# Patient Record
Sex: Male | Born: 1960
Health system: Southern US, Community
[De-identification: ages and names within clinical notes are randomized; demographics above are authoritative.]

## PROBLEM LIST (undated history)

## (undated) DIAGNOSIS — J45909 Unspecified asthma, uncomplicated: Secondary | ICD-10-CM

## (undated) DIAGNOSIS — Z720 Tobacco use: Secondary | ICD-10-CM

## (undated) DIAGNOSIS — J189 Pneumonia, unspecified organism: Secondary | ICD-10-CM

## (undated) DIAGNOSIS — G43909 Migraine, unspecified, not intractable, without status migrainosus: Secondary | ICD-10-CM

## (undated) DIAGNOSIS — F1020 Alcohol dependence, uncomplicated: Secondary | ICD-10-CM

## (undated) DIAGNOSIS — E663 Overweight: Secondary | ICD-10-CM

## (undated) DIAGNOSIS — R0600 Dyspnea, unspecified: Secondary | ICD-10-CM

## (undated) DIAGNOSIS — K219 Gastro-esophageal reflux disease without esophagitis: Secondary | ICD-10-CM

## (undated) DIAGNOSIS — R748 Abnormal levels of other serum enzymes: Secondary | ICD-10-CM

## (undated) DIAGNOSIS — N200 Calculus of kidney: Secondary | ICD-10-CM

## (undated) DIAGNOSIS — I10 Essential (primary) hypertension: Secondary | ICD-10-CM

## (undated) DIAGNOSIS — R519 Headache, unspecified: Secondary | ICD-10-CM

## (undated) DIAGNOSIS — Z860100 Personal history of colon polyps, unspecified: Secondary | ICD-10-CM

## (undated) DIAGNOSIS — R7303 Prediabetes: Secondary | ICD-10-CM

## (undated) DIAGNOSIS — G709 Myoneural disorder, unspecified: Secondary | ICD-10-CM

## (undated) DIAGNOSIS — M1990 Unspecified osteoarthritis, unspecified site: Secondary | ICD-10-CM

## (undated) DIAGNOSIS — R443 Hallucinations, unspecified: Secondary | ICD-10-CM

## (undated) DIAGNOSIS — D649 Anemia, unspecified: Secondary | ICD-10-CM

## (undated) DIAGNOSIS — R51 Headache: Secondary | ICD-10-CM

## (undated) HISTORY — DX: Overweight: E66.3

## (undated) HISTORY — DX: Abnormal levels of other serum enzymes: R74.8

## (undated) HISTORY — DX: Tobacco use: Z72.0

## (undated) HISTORY — DX: Personal history of colonic polyps: Z86.010

## (undated) HISTORY — DX: Calculus of kidney: N20.0

## (undated) HISTORY — DX: Essential (primary) hypertension: I10

## (undated) HISTORY — DX: Alcohol dependence, uncomplicated: F10.20

## (undated) HISTORY — DX: Unspecified asthma, uncomplicated: J45.909

## (undated) HISTORY — DX: Migraine, unspecified, not intractable, without status migrainosus: G43.909

## (undated) HISTORY — DX: Personal history of colon polyps, unspecified: Z86.0100

## (undated) HISTORY — DX: Hallucinations, unspecified: R44.3

## (undated) HISTORY — DX: Prediabetes: R73.03

---

## 2000-02-01 HISTORY — PX: OTHER SURGICAL HISTORY: SHX169

## 2000-06-21 ENCOUNTER — Ambulatory Visit (HOSPITAL_COMMUNITY): Admission: RE | Admit: 2000-06-21 | Discharge: 2000-06-21 | Payer: Self-pay | Admitting: General Surgery

## 2000-07-01 ENCOUNTER — Inpatient Hospital Stay (HOSPITAL_COMMUNITY): Admission: RE | Admit: 2000-07-01 | Discharge: 2000-07-02 | Payer: Self-pay | Admitting: General Surgery

## 2002-01-31 HISTORY — PX: COLONOSCOPY: SHX174

## 2002-12-03 ENCOUNTER — Ambulatory Visit (HOSPITAL_COMMUNITY): Admission: RE | Admit: 2002-12-03 | Discharge: 2002-12-03 | Payer: Self-pay | Admitting: Urology

## 2002-12-03 ENCOUNTER — Ambulatory Visit (HOSPITAL_BASED_OUTPATIENT_CLINIC_OR_DEPARTMENT_OTHER): Admission: RE | Admit: 2002-12-03 | Discharge: 2002-12-03 | Payer: Self-pay | Admitting: Urology

## 2002-12-03 ENCOUNTER — Encounter (INDEPENDENT_AMBULATORY_CARE_PROVIDER_SITE_OTHER): Payer: Self-pay

## 2002-12-07 ENCOUNTER — Emergency Department (HOSPITAL_COMMUNITY): Admission: EM | Admit: 2002-12-07 | Discharge: 2002-12-07 | Payer: Self-pay | Admitting: Emergency Medicine

## 2005-01-31 HISTORY — PX: COLONOSCOPY: SHX174

## 2005-04-12 ENCOUNTER — Ambulatory Visit: Payer: Self-pay | Admitting: Family Medicine

## 2005-05-10 ENCOUNTER — Ambulatory Visit (HOSPITAL_COMMUNITY): Admission: RE | Admit: 2005-05-10 | Discharge: 2005-05-10 | Payer: Self-pay | Admitting: Family Medicine

## 2005-05-10 ENCOUNTER — Ambulatory Visit: Payer: Self-pay | Admitting: Family Medicine

## 2005-09-12 ENCOUNTER — Ambulatory Visit: Payer: Self-pay | Admitting: Family Medicine

## 2005-09-13 ENCOUNTER — Ambulatory Visit: Payer: Self-pay | Admitting: Gastroenterology

## 2005-09-13 ENCOUNTER — Ambulatory Visit (HOSPITAL_COMMUNITY): Admission: RE | Admit: 2005-09-13 | Discharge: 2005-09-13 | Payer: Self-pay | Admitting: Family Medicine

## 2005-09-15 ENCOUNTER — Ambulatory Visit (HOSPITAL_COMMUNITY): Admission: RE | Admit: 2005-09-15 | Discharge: 2005-09-15 | Payer: Self-pay | Admitting: Gastroenterology

## 2005-09-16 ENCOUNTER — Ambulatory Visit (HOSPITAL_COMMUNITY): Admission: RE | Admit: 2005-09-16 | Discharge: 2005-09-16 | Payer: Self-pay | Admitting: Gastroenterology

## 2005-09-16 ENCOUNTER — Ambulatory Visit: Payer: Self-pay | Admitting: Gastroenterology

## 2005-10-11 ENCOUNTER — Ambulatory Visit: Payer: Self-pay | Admitting: Gastroenterology

## 2006-03-10 ENCOUNTER — Encounter: Payer: Self-pay | Admitting: Family Medicine

## 2006-03-10 ENCOUNTER — Ambulatory Visit: Payer: Self-pay | Admitting: Gastroenterology

## 2006-09-18 ENCOUNTER — Emergency Department (HOSPITAL_COMMUNITY): Admission: EM | Admit: 2006-09-18 | Discharge: 2006-09-18 | Payer: Self-pay | Admitting: Emergency Medicine

## 2007-01-19 ENCOUNTER — Emergency Department (HOSPITAL_COMMUNITY): Admission: EM | Admit: 2007-01-19 | Discharge: 2007-01-19 | Payer: Self-pay | Admitting: Emergency Medicine

## 2007-03-13 ENCOUNTER — Ambulatory Visit: Payer: Self-pay | Admitting: Gastroenterology

## 2007-04-19 ENCOUNTER — Ambulatory Visit: Payer: Self-pay | Admitting: Family Medicine

## 2007-04-19 LAB — CONVERTED CEMR LAB
BUN: 10 mg/dL (ref 6–23)
Basophils Absolute: 0.1 10*3/uL (ref 0.0–0.1)
Basophils Relative: 1 % (ref 0–1)
CO2: 22 meq/L (ref 19–32)
Calcium: 8.9 mg/dL (ref 8.4–10.5)
Chloride: 106 meq/L (ref 96–112)
Cholesterol: 159 mg/dL (ref 0–200)
Creatinine, Ser: 0.85 mg/dL (ref 0.40–1.50)
Eosinophils Absolute: 0.4 10*3/uL (ref 0.0–0.7)
Eosinophils Relative: 6 % — ABNORMAL HIGH (ref 0–5)
Glucose, Bld: 95 mg/dL (ref 70–99)
HCT: 47 % (ref 39.0–52.0)
HDL: 38 mg/dL — ABNORMAL LOW (ref >39)
Hemoglobin: 15.7 g/dL (ref 13.0–17.0)
LDL Cholesterol: 93 mg/dL (ref 0–99)
Lymphocytes Relative: 42 % (ref 12–46)
Lymphs Abs: 2.7 10*3/uL (ref 0.7–4.0)
MCHC: 33.4 g/dL (ref 30.0–36.0)
MCV: 92.9 fL (ref 78.0–100.0)
Monocytes Absolute: 0.6 10*3/uL (ref 0.1–1.0)
Monocytes Relative: 9 % (ref 3–12)
Neutro Abs: 2.7 10*3/uL (ref 1.7–7.7)
Neutrophils Relative %: 41 % — ABNORMAL LOW (ref 43–77)
PSA: 0.85 ng/mL (ref 0.10–4.00)
Platelets: 213 10*3/uL (ref 150–400)
Potassium: 4.4 meq/L (ref 3.5–5.3)
RBC: 5.06 M/uL (ref 4.22–5.81)
RDW: 13.9 % (ref 11.5–15.5)
Sodium: 142 meq/L (ref 135–145)
Triglycerides: 141 mg/dL (ref <150)
VLDL: 28 mg/dL (ref 0–40)
WBC: 6.5 10*3/uL (ref 4.0–10.5)

## 2007-08-20 DIAGNOSIS — F1010 Alcohol abuse, uncomplicated: Secondary | ICD-10-CM | POA: Insufficient documentation

## 2008-02-04 ENCOUNTER — Encounter: Payer: Self-pay | Admitting: Orthopedic Surgery

## 2008-02-04 ENCOUNTER — Emergency Department (HOSPITAL_COMMUNITY): Admission: EM | Admit: 2008-02-04 | Discharge: 2008-02-04 | Payer: Self-pay | Admitting: Emergency Medicine

## 2008-02-14 ENCOUNTER — Ambulatory Visit: Payer: Self-pay | Admitting: Family Medicine

## 2008-02-24 DIAGNOSIS — E049 Nontoxic goiter, unspecified: Secondary | ICD-10-CM | POA: Insufficient documentation

## 2008-04-24 ENCOUNTER — Ambulatory Visit: Payer: Self-pay | Admitting: Family Medicine

## 2008-04-24 DIAGNOSIS — R9431 Abnormal electrocardiogram [ECG] [EKG]: Secondary | ICD-10-CM | POA: Insufficient documentation

## 2008-04-24 DIAGNOSIS — R079 Chest pain, unspecified: Secondary | ICD-10-CM | POA: Insufficient documentation

## 2008-04-25 ENCOUNTER — Encounter: Payer: Self-pay | Admitting: Family Medicine

## 2008-04-26 ENCOUNTER — Encounter: Payer: Self-pay | Admitting: Family Medicine

## 2008-04-29 ENCOUNTER — Encounter: Payer: Self-pay | Admitting: Family Medicine

## 2008-04-30 LAB — CONVERTED CEMR LAB
BUN: 9 mg/dL (ref 6–23)
Basophils Absolute: 0.1 10*3/uL (ref 0.0–0.1)
Basophils Relative: 1 % (ref 0–1)
CO2: 19 meq/L (ref 19–32)
Calcium: 9.7 mg/dL (ref 8.4–10.5)
Chloride: 105 meq/L (ref 96–112)
Cholesterol: 110 mg/dL (ref 0–200)
Creatinine, Ser: 0.91 mg/dL (ref 0.40–1.50)
Eosinophils Absolute: 0.3 10*3/uL (ref 0.0–0.7)
Eosinophils Relative: 4 % (ref 0–5)
Glucose, Bld: 87 mg/dL (ref 70–99)
HCT: 47.8 % (ref 39.0–52.0)
HDL: 35 mg/dL — ABNORMAL LOW (ref >39)
Hemoglobin: 15.7 g/dL (ref 13.0–17.0)
LDL Cholesterol: 39 mg/dL (ref 0–99)
Lymphocytes Relative: 44 % (ref 12–46)
Lymphs Abs: 3.2 10*3/uL (ref 0.7–4.0)
MCHC: 32.8 g/dL (ref 30.0–36.0)
MCV: 92.8 fL (ref 78.0–100.0)
Monocytes Absolute: 0.7 10*3/uL (ref 0.1–1.0)
Monocytes Relative: 10 % (ref 3–12)
Neutro Abs: 2.9 10*3/uL (ref 1.7–7.7)
Neutrophils Relative %: 41 % — ABNORMAL LOW (ref 43–77)
PSA: 0.87 ng/mL (ref 0.10–4.00)
Platelets: 258 10*3/uL (ref 150–400)
Potassium: 4.5 meq/L (ref 3.5–5.3)
RBC: 5.15 M/uL (ref 4.22–5.81)
RDW: 13.5 % (ref 11.5–15.5)
Sodium: 136 meq/L (ref 135–145)
Total CHOL/HDL Ratio: 3.1
Triglycerides: 178 mg/dL — ABNORMAL HIGH (ref <150)
VLDL: 36 mg/dL (ref 0–40)
WBC: 7.2 10*3/uL (ref 4.0–10.5)

## 2008-05-23 ENCOUNTER — Telehealth: Payer: Self-pay | Admitting: Family Medicine

## 2008-07-30 ENCOUNTER — Encounter: Payer: Self-pay | Admitting: Family Medicine

## 2009-03-04 ENCOUNTER — Ambulatory Visit: Payer: Self-pay | Admitting: Orthopedic Surgery

## 2009-03-04 ENCOUNTER — Encounter (INDEPENDENT_AMBULATORY_CARE_PROVIDER_SITE_OTHER): Payer: Self-pay | Admitting: *Deleted

## 2009-03-04 ENCOUNTER — Ambulatory Visit: Payer: Self-pay | Admitting: Family Medicine

## 2009-03-04 DIAGNOSIS — M23302 Other meniscus derangements, unspecified lateral meniscus, unspecified knee: Secondary | ICD-10-CM | POA: Insufficient documentation

## 2009-03-04 DIAGNOSIS — M25569 Pain in unspecified knee: Secondary | ICD-10-CM | POA: Insufficient documentation

## 2009-03-04 LAB — CONVERTED CEMR LAB
BUN: 11 mg/dL
Chloride: 103 meq/L
Eosinophils Absolute: 0.2 10*3/uL
Eosinophils Relative: 3 %
Glucose, Bld: 78 mg/dL
Hemoglobin: 14.7 g/dL
Lymphs Abs: 2.2 10*3/uL
MCV: 93.6 fL
Monocytes Relative: 10 %
Platelets: 168 10*3/uL
Potassium: 3.8 meq/L
TSH: 1.721 microintl units/mL
Triglycerides: 169 mg/dL
WBC: 6.9 10*3/uL

## 2009-03-05 LAB — CONVERTED CEMR LAB
Chloride: 103 meq/L (ref 96–112)
Creatinine, Ser: 0.75 mg/dL (ref 0.40–1.50)
Eosinophils Absolute: 0.2 10*3/uL (ref 0.0–0.7)
HDL: 42 mg/dL (ref >39)
LDL Cholesterol: 106 mg/dL — ABNORMAL HIGH (ref 0–99)
Lymphocytes Relative: 31 % (ref 12–46)
Lymphs Abs: 2.2 10*3/uL (ref 0.7–4.0)
Monocytes Relative: 10 % (ref 3–12)
Neutro Abs: 3.8 10*3/uL (ref 1.7–7.7)
Neutrophils Relative %: 56 % (ref 43–77)
Platelets: 168 10*3/uL (ref 150–400)
RBC: 4.71 M/uL (ref 4.22–5.81)
Sodium: 139 meq/L (ref 135–145)
Total CHOL/HDL Ratio: 4.3
Triglycerides: 169 mg/dL — ABNORMAL HIGH (ref <150)
WBC: 6.9 10*3/uL (ref 4.0–10.5)

## 2009-04-20 ENCOUNTER — Telehealth: Payer: Self-pay | Admitting: Orthopedic Surgery

## 2009-08-19 ENCOUNTER — Ambulatory Visit: Payer: Self-pay | Admitting: Family Medicine

## 2009-08-19 ENCOUNTER — Ambulatory Visit (HOSPITAL_COMMUNITY): Admission: RE | Admit: 2009-08-19 | Discharge: 2009-08-19 | Payer: Self-pay | Admitting: Family Medicine

## 2009-08-19 ENCOUNTER — Encounter (INDEPENDENT_AMBULATORY_CARE_PROVIDER_SITE_OTHER): Payer: Self-pay

## 2009-09-01 ENCOUNTER — Encounter (INDEPENDENT_AMBULATORY_CARE_PROVIDER_SITE_OTHER): Payer: Self-pay | Admitting: *Deleted

## 2009-09-07 ENCOUNTER — Ambulatory Visit: Payer: Self-pay | Admitting: Cardiology

## 2009-09-07 ENCOUNTER — Encounter (INDEPENDENT_AMBULATORY_CARE_PROVIDER_SITE_OTHER): Payer: Self-pay | Admitting: *Deleted

## 2009-09-07 DIAGNOSIS — D126 Benign neoplasm of colon, unspecified: Secondary | ICD-10-CM | POA: Insufficient documentation

## 2009-09-07 DIAGNOSIS — K7689 Other specified diseases of liver: Secondary | ICD-10-CM | POA: Insufficient documentation

## 2009-09-08 ENCOUNTER — Encounter: Payer: Self-pay | Admitting: Cardiology

## 2009-09-15 ENCOUNTER — Encounter (INDEPENDENT_AMBULATORY_CARE_PROVIDER_SITE_OTHER): Payer: Self-pay | Admitting: *Deleted

## 2009-09-17 ENCOUNTER — Encounter: Payer: Self-pay | Admitting: Cardiology

## 2009-09-17 ENCOUNTER — Ambulatory Visit (HOSPITAL_COMMUNITY): Admission: RE | Admit: 2009-09-17 | Discharge: 2009-09-17 | Payer: Self-pay | Admitting: Cardiology

## 2009-09-17 ENCOUNTER — Ambulatory Visit: Payer: Self-pay | Admitting: Cardiology

## 2009-09-22 ENCOUNTER — Ambulatory Visit: Payer: Self-pay | Admitting: Cardiology

## 2009-10-07 ENCOUNTER — Ambulatory Visit: Payer: Self-pay | Admitting: Family Medicine

## 2009-10-12 ENCOUNTER — Encounter: Payer: Self-pay | Admitting: Cardiology

## 2009-11-24 ENCOUNTER — Encounter: Payer: Self-pay | Admitting: Family Medicine

## 2010-01-08 ENCOUNTER — Encounter (INDEPENDENT_AMBULATORY_CARE_PROVIDER_SITE_OTHER): Payer: Self-pay | Admitting: *Deleted

## 2010-01-31 HISTORY — PX: CATARACT EXTRACTION W/ INTRAOCULAR LENS IMPLANT: SHX1309

## 2010-02-03 LAB — HM COLONOSCOPY

## 2010-02-09 ENCOUNTER — Ambulatory Visit
Admission: RE | Admit: 2010-02-09 | Discharge: 2010-02-09 | Payer: Self-pay | Source: Home / Self Care | Attending: Family Medicine | Admitting: Family Medicine

## 2010-02-09 DIAGNOSIS — K648 Other hemorrhoids: Secondary | ICD-10-CM | POA: Insufficient documentation

## 2010-02-21 ENCOUNTER — Encounter: Payer: Self-pay | Admitting: Family Medicine

## 2010-02-25 ENCOUNTER — Telehealth (INDEPENDENT_AMBULATORY_CARE_PROVIDER_SITE_OTHER): Payer: Self-pay | Admitting: *Deleted

## 2010-03-01 ENCOUNTER — Encounter: Payer: Self-pay | Admitting: Family Medicine

## 2010-03-02 NOTE — Letter (Signed)
Summary: Out of Work  [facility]  [address]  [place], [place] [address]   Phone: [phone]  Fax: [phone]    March 04, 2009   Employee:  Bryan Rodgers    To Whom It May Concern:   For Medical reasons, please excuse the above named employee from work for the following dates:  Start:   Wed, March 04, 2009  End:   Fri,  March 06, 2009  Medically cleared to return to work on next scheduled day:  Tues, March 10, 2009   If you need additional information, please feel free to contact our office.         Sincerely,    Terrance Mass, MD

## 2010-03-02 NOTE — Assessment & Plan Note (Signed)
Summary: office visit   Vital Signs:  Patient profile:   50 year old male Height:      70 inches Weight:      196 pounds BMI:     28.22 O2 Sat:      98 % Pulse rate:   72 / minute Pulse rhythm:   regular Resp:     16 per minute BP sitting:   120 / 90  (left arm)  Vitals Entered By: Everitt Amber LPN (October 07, 2009 11:25 AM)  Nutrition Counseling: Patient's BMI is greater than 25 and therefore counseled on weight management options. CC: Follow up chronic problems   Primary Care Provider:  Syliva Overman MD  CC:  Follow up chronic problems.  History of Present Illness: Reports  that he has been doing well. Denies recent fever or chills. Denies sinus pressure, nasal congestion , ear pain or sore throat. Denies chest congestion, or cough productive of sputum. Denies chest pain, palpitations, PND, orthopnea or leg swelling.He has recently been evaluated by the local cardiologists and is reassured that he has no cAD Denies abdominal pain, nausea, vomitting, diarrhea or constipation. Denies change in bowel movements or bloody stool. Denies dysuria , frequency, incontinence or hesitancy. Denies  joint pain, swelling, or reduced mobility. Denies headaches, vertigo, seizures. Denies depression, anxiety or insomnia. Denies  rash, lesions, or itch.       Preventive Screening-Counseling & Management  Alcohol-Tobacco     Smoking Cessation Counseling: yes  Current Medications (verified): 1)  Ibuprofen 800 Mg Tabs (Ibuprofen) .... Take 1 Tablet By Mouth Three Times A Day 2)  Chlorthalidone 25 Mg Tabs (Chlorthalidone) .... Take 1/2   Tablet By Mouth Daily 3)  Metoprolol Succinate 25 Mg Xr24h-Tab (Metoprolol Succinate) .... Take 1/2  Tablet By Mouth Daily  Allergies (verified): No Known Drug Allergies  Review of Systems      See HPI Eyes:  Denies blurring, discharge, eye pain, and red eye. Endo:  Denies excessive thirst and excessive urination. Heme:  Denies abnormal  bruising and bleeding. Allergy:  Denies hives or rash and itching eyes.  Physical Exam  General:  Well-developed,well-nourished,in no acute distress; alert,appropriate and cooperative throughout examination HEENT: No facial asymmetry,  EOMI, No sinus tenderness, TM's Clear, oropharynx  pink and moist.   Chest: Clear to auscultation bilaterally.  CVS: S1, S2, No murmurs, No S3.   Abd: Soft, Nontender.  MS: Adequate ROM spine, hips, shoulders and knees.  Ext: No edema.   CNS: CN 2-12 intact, power tone and sensation normal throughout.   Skin: Intact, no visible lesions or rashes.  Psych: Good eye contact, normal affect.  Memory intact, not anxious or depressed appearing.    Impression & Recommendations:  Problem # 1:  HYPERTENSION-MILD (ICD-401.1) Assessment Unchanged  His updated medication list for this problem includes:    Chlorthalidone 25 Mg Tabs (Chlorthalidone) .Marland Kitchen... Take 1/2   tablet by mouth daily    Metoprolol Succinate 25 Mg Xr24h-tab (Metoprolol succinate) .Marland Kitchen... Take 1/2  tablet by mouth daily Check your Blood Pressure regularly. If it is above150/95  you should make an appointment.   BP today: 120/90 Prior BP: 139/81 (09/22/2009)  Labs Reviewed: K+: 3.8 (03/04/2009) Creat: : 0.75 (03/04/2009)   Chol: 182 (03/04/2009)   HDL: 42 (03/04/2009)   LDL: 106 (03/04/2009)   TG: 169 (03/04/2009)  Problem # 2:  TOBACCO ABUSE (ICD-305.1) Assessment: Improved  Encouraged smoking cessation and discussed different methods for smoking cessation.   Complete Medication  List: 1)  Ibuprofen 800 Mg Tabs (Ibuprofen) .... Take 1 tablet by mouth three times a day 2)  Chlorthalidone 25 Mg Tabs (Chlorthalidone) .... Take 1/2   tablet by mouth daily 3)  Metoprolol Succinate 25 Mg Xr24h-tab (Metoprolol succinate) .... Take 1/2  tablet by mouth daily  Patient Instructions: 1)  Please schedule a fcPE in January 2)  pLs take one half of each tablet once daily for your blood pressure 3)   Tobacco is very bad for your health and your loved ones! You Should stop smoking!. 4)  Stop Smoking Tips: Choose a Quit date. Cut down before the Quit date. decide what you will do as a substitute when you feel the urge to smoke(gum,toothpick,exercise). 5)  It is important that you exercise regularly at least 20 minutes 5 times a week. If you develop chest pain, have severe difficulty breathing, or feel very tired , stop exercising immediately and seek medical attention. 6)  You need to lose weight. Consider a lower calorie diet and regular exercise.

## 2010-03-02 NOTE — Letter (Signed)
Summary: [facility] 08/[facility]/09  [facility] 08[facility]   Imported By: Faythe Ghee 10/12/2009 12:33:16  _____________________________________________________________________  External Attachment:    Type:   Image     Comment:   External Document

## 2010-03-02 NOTE — Assessment & Plan Note (Signed)
Summary: RT LEG PAIN,SWELLING/DR SIMPSON REQ APPT TODAY/NEEDS XRAYS/SE...   Vital Signs:  Patient profile:   50 year old male Weight:      206 pounds Pulse rate:   88 / minute Resp:     18 per minute  Visit Type:  Initial Consult Referring Provider:  Dr.Simpson Primary Provider:  Syliva Overman MD  CC:  left knee pain.  History of Present Illness: I saw Bryan Rodgers in the office today for an initial visit.  He is a 50 years old man with the complaint of:  Left knee pain.  DOI 03/03/09.  Immediate consult requested by Dr. Lodema Hong regarding this patient's LEFT knee.  The patient went to work at his normal activities he was getting up out of a chair of acute pain in the LEFT knee where his knee became locked would not bend or straighten.  The pain then increased to be very severe.  There is no swelling.  The pain was essentially on the medial side bending him from normal weightbearing.  He denies neuralgias.   Will have xrays today.  he does have a history of some back pain and the L-spine x-ray on every fourth 2010.  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 Cardiac :  Denies chest pain, angina, heart attack, heart failure, poor circulation, blood clots, and phlebitis. Resp:  Denies short of breath, difficulty breathing, COPD, cough, and pneumonia. GI:  Denies nausea, vomiting, diarrhea, constipation, difficulty swallowing, ulcers, GERD, and reflux. GU:  Denies kidney failure, kidney transplant, kidney stones, burning, poor stream, testicular cancer, blood in urine, and . Neuro:  Denies headache, dizziness,  migraines, numbness, weakness, tremor, and unsteady walking. MS:  Complains of joint pain and joint swelling; denies rheumatoid arthritis, gout, bone cancer, osteoporosis, and . Endo:  Denies thyroid disease, goiter, and diabetes. Psych:  Denies depression, mood swings, anxiety, panic attack, bipolar, and schizophrenia. Derm:  Denies eczema, cancer, and itching. EENT:  Denies poor vision, cataracts, glaucoma, poor hearing, vertigo, ears ringing, sinusitis, hoarseness, toothaches, and bleeding gums. Immunology:  Denies seasonal allergies, sinus problems, and allergic to bee stings. Lymphatic:  Denies lymph node cancer and lymph edema.  Physical Exam  Extremities:   The upper extremities have normal appearance, ROM, strength and stability.  Additional Exam:  vital signs are stable as recorded  His appearance is normal muscular build normal nutrition no deformities good grooming  Peripheral vascular system no swelling normal pulses and temperature no edema  Lymph nodes lower extremities negative  Gait and station are abnormal he cannot walk without a significant limp  He has medial joint line tenderness there is no joint effusion his knee will not bend or straighten and is stuck at about 10 flexion his knee is stable.  I could not do a McMurray sign but he has essentially a locked knee  His skin is intact  His normal coordination reflexes sensation and is oriented and his mood and affect are normal   Impression & Recommendations:  Problem # 1:  DERANGEMENT MENISCUS (ICD-717.5)  we will we will try to get the MRI of the knee.  He  should use ice and ibuprofen and Vicodin for pain.  We'll get him a knee brace as well.  He's out of work.  Orders: New Patient Level IV ([address])  Medications Added to Medication List This Visit: 1)  Norco 5-325 Mg Tabs (Hydrocodone-acetaminophen) .Marland Kitchen.. 1 by mouth q 4 as needed pain 2)  Ibuprofen 800 Mg Tabs (Ibuprofen) .Marland Kitchen.. 1 by mouth three times a  day  Patient Instructions: 1)  MRI Left knee  2)  Ice three times a day  3)  take pain medication  4)  OOW  5)  Brace for ambulation (knee immobilizer) Prescriptions: IBUPROFEN 800 MG TABS (IBUPROFEN) 1 by mouth three times a day  #90 x 1   Entered and Authorized by:   Fuller Canada MD   Signed by:   Fuller Canada MD on 03/04/2009   Method used:   Print then Give to Patient   RxID:   [id number] NORCO 5-325 MG TABS (HYDROCODONE-ACETAMINOPHEN) 1 by mouth q 4 as needed pain  #42 x 1   Entered and Authorized by:   Fuller Canada MD   Signed by:   Fuller Canada MD on 03/04/2009   Method used:   Print then Give to Patient   RxID:   [id number]

## 2010-03-02 NOTE — Letter (Signed)
Summary: Letter  Letter   Imported By: Lind Guest 11/25/2009 14:40:54  _____________________________________________________________________  External Attachment:    Type:   Image     Comment:   External Document

## 2010-03-02 NOTE — Assessment & Plan Note (Signed)
Summary: Recent hospital admit for chest pain   Visit Type:  Initial Consult Referring Bryan Rodgers:  . Primary Bryan Rodgers:  Syliva Overman MD   History of Present Illness: [name] is seen in the office for an initial visit at the kind request of Dr. Lodema Hong for the evaluation of chest discomfort.  This nice gentleman was recently working in the [employer] area when he developed the sudden onset of moderately severe localized left upper chest pressure.  Onset was at rest, and there was no radiation.  Discomfort persisted for only approximately 20 seconds.  There was no associated dyspnea, diaphoresis or nausea.  Patient was in or near the local EMS facility, was instantaneously evaluated and transported to [facility] where myocardial infarction was ruled out.  He was told he had a mild myopathy, and fenofibrate was discontinued.  His chest discomfort was not thought to be cardiac in origin, and no cardiac testing was advised.  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  Mr. and [name] clearly describe care in a [facility] hospital, probably [place], for chest discomfort.  Evaluation included a stress test; however, there is no record of such a visit in patient's inpatient chart.  Hypertension was diagnosed during his hospitalization and metoprolol started.  Since then, his wife has noted that he has increasing fatigue and decreased exercise capacity.  He reports the sensation of his heart beating forcefully and perhaps rapidly on occasion.  EKG  Procedure date:  2009-09-28  Findings:      Normal sinus rhythm Incomplete right bundle branch block Or early repolarization, most apparent in lead V2 Shallow T wave inversion, nonspecific; noted in lead V3. No previous tracing for comparison.   Current Medications (verified): 1)  Ibuprofen 800 Mg Tabs (Ibuprofen) .... Take 1 Tablet By Mouth Three Times A  Day 2)  Chlorthalidone 25 Mg Tabs (Chlorthalidone) .... Take 1/2   Tablet By Mouth Daily  Allergies (verified): No Known Drug Allergies  Past History:  Past Surgical History: Last updated: 08/20/2007 hemmroidectomy 2002  Family History: Last updated: 09/28/09 Mother deceased at age 63 secondary to breast carcinoma Father died at age 92 as a result of neoplastic disease of the bone Siblings: Sister has diabetes and carcinoma of the breast; 2 brothers are alive and well.  Social History: Last updated: September 28, 2009 EMPLOYED bld technician Married; no children no smoking Alcohol-primarily beer on the weekends, sometimes in the sizable quantities. no caffeine Drug use-no  Past Medical History: Chest pain Elevated CPK Palpitations ALCOHOL USE (ICD-305.00)10-15 beers per day on the weekends Tobacco abuse: One half pack per day; 20-pack-year history Overweight Hemorrhoids with rectal bleeding Colonic polyps removed via colonoscope  Family History: Mother deceased at age 19 secondary to breast carcinoma Father died at age 32 as a result of neoplastic disease of the bone Siblings: Sister has diabetes and carcinoma of the breast; 2 brothers are alive and well.  Social History: EMPLOYED bld Pensions consultant Married; no children no smoking Alcohol-primarily beer on the weekends, sometimes in the sizable quantities. no caffeine Drug use-no  Review of Systems       requires corrective lenses; all other systems reviewed and are negative.  Vital Signs:  Patient profile:   50 year old male Height:      70 inches Weight:      198 pounds BMI:     28.51 Pulse rate:   77 / minute  BP sitting:   138 / 90  (right arm)  Vitals Entered By: Dreama Saa, CNA (September 07, 2009 1:14 PM)  Physical Exam  General:  Mildly overweight;w ell-developed; no acute distress: HEENT-[place]/AT; PERRL; EOM intact; conjunctiva and lids nl:  Neck-No JVD; no carotid bruits: Endocrine-No  thyromegaly: Lungs-No tachypnea, clear without rales, rhonchi or wheezes: CV-normal PMI; normal S1; normally split S2 Abdomen-BS normal; soft and non-tender without masses or organomegaly: MS-No deformities, cyanosis or clubbing: Neurologic-Nl cranial nerves; symmetric strength and tone: Skin- Warm, no sig. lesions: Extremities-Nl distal pulses; no edema    Impression & Recommendations:  Problem # 1:  CHEST PAIN (ICD-786.50) I agree with the impression at [facility] that chest discomfort was of chest wall origin.  I doubt he would have ended up in hospital were he not immediately attended by EMS personnel.  His cardiovascular risk factors are fairly modest.  Due to his complaints of exercise intolerance and fatigue, a stress echocardiogram will be performed to evaluate exercise capacity and symptoms,  to assess the liklihood of ischemic heart disease, and to evaluate left ventricular systolic function.  Recent laboratory testing excludes significant thyroid problems, anemia, renal impairment and hepatic dysfunction.  Prior imaging studies have shown the presence of substantial infiltration of fat in the liver, but I doubt this is causing problems in the absence of any laboratory evidence for hepatitis.  Beta blocker may be contributing to patient's fatigue.  Problem # 2:  TOBACCO ABUSE (ICD-305.1) Patient encouraged to quit, but does not appear motivated at the present time.  He has decreased consumption, but never tried to stop smoking entirely.  Wife is encouraging him  to make this effort.  Problem # 3:  ALCOHOL USE (ICD-305.00) Quantification of alcohol consumption was difficult; patient was possibly somewhat evasive.  It sounds as if consumption of beer is excessive, at least on the weekends.  Patient was encouraged to limit intake to 4 or fewer beers per day on the weekends.  This could be contributing to fatty liver as well as hypertension.  Problem # 4:  HYPERTENSION-MILD  (ICD-401.1) Hypertension appears quite mild and may well be adequately controlled with a single drug.  Patient may be experiencing adverse effects of metoprolol, which is not the drug of choice in any case.  Chlorthalidone 12.5 mg q.d. will be substituted.  Patient will collect additional blood pressure readings at home or at local pharmacies and return for a blood pressure check by the cardiology nurses in one month.  Other Orders: Stress Echo (Stress Echo)  Patient Instructions: 1)  Your physician recommends that you schedule a follow-up appointment in: after stress echo 2)  Your physician has recommended you make the following change in your medication: stop, metoprolol, start chlorthalidone 25mg    take 1/2 tablet by mouth daily 3)  Your physician has requested that you have a stress echocardiogram. For further information please visit [email or web address]  Please follow instruction sheet as given. 4)  Your physician has requested that you regularly monitor and record your blood pressure readings at home.  Please use the same machine at the same time of day to check your readings and record them to bring to your follow-up visit. 5)  Your physician discussed the hazards of tobacco use.  Tobacco use cessation is recommended and techniques and options to help you quit were discussed. Prescriptions: CHLORTHALIDONE 25 MG TABS (CHLORTHALIDONE) Take 1/2   tablet by mouth daily  #15 x 3   Entered by:   Tammy  Allyne Gee RN   Authorized by:   Kathlen Brunswick, MD, [facility]   Signed by:   Teressa Lower RN on 09/07/2009   Method used:   Electronically to        [facility] (retail)       [address]       [place], [place]  [address]       Ph: [phone]       Fax: [phone]   RxID:   [id number]

## 2010-03-02 NOTE — Letter (Signed)
Summary: Stress Echocardiogram Information Sheet  [facility]  [address] [place] [address]   Phone: [phone]  Fax: [phone]      September 07, 2009 MRN: [id number] light prior to the test.   Gerri Spore  Doctor: Appointment Date: Appointment Time: Appointment Location: [facility]  Stress Echocardiogram Information Sheet    Instructions:   1. DO NOT  take your chlorthalidone medicine morning before the test.  2.Nothing to eat or drink before the test  3. Dress prepared to exercise.  4. DO NOT use ANY caffine or tobacco products 3 hours before appointment.  5. Report to the Short Stay Center on the1st floor.  6. Please bring all current prescription medications.  7. If you have any questions, please call [phone]

## 2010-03-02 NOTE — Assessment & Plan Note (Signed)
Summary: follow up from ER   Vital Signs:  Patient profile:   50 year old male Height:      70 inches Weight:      197.25 pounds BMI:     28.40 O2 Sat:      98 % Pulse rate:   74 / minute Pulse rhythm:   regular Resp:     16 per minute BP sitting:   134 / 84  (left arm) Cuff size:   large  Vitals Entered By: Everitt Amber LPN (August 19, 2009 1:18 PM)  Nutrition Counseling: Patient's BMI is greater than 25 and therefore counseled on weight management options. CC: [facility] follow up, had muscle breakdown that was causing his chest pain   Primary Care Provider:  Syliva Overman MD  CC:  [facility] follow up and had muscle breakdown that was causing his chest pain.  History of Present Illness: 1 week ago pt experienced left chest pain for approx 20 mins accompanied by exhaustion , he experienced no nausea, diaphoresis ,, or radiartion. this was the fiorst episodepain was a 10. 3 days ago on monday, while lifting approx 80 pounds he experinced a 10 rightlower chest pain, stopped him in his track approx 10 mins, pain radiates to RUQ and  right flank pain. pt states his cK was high , he was admitted overnight at [facility] and his d/c dx is rhabdo supposed med s/e , tricor stopped, musculoskeletal chest pain and hTN  report fatigue, SOB and lower abd pain x 2 days, the abd pain was apparently not an issue when the pt was admitted for chestpain. Generally he has been doing well prior to this.  Current Medications (verified): 1)  Norco 5-325 Mg Tabs (Hydrocodone-Acetaminophen) .Marland Kitchen.. 1 By Mouth Q 4 As Needed Pain 2)  Ibuprofen 800 Mg Tabs (Ibuprofen) .Marland Kitchen.. 1 By Mouth Three Times A Day  Allergies (verified): No Known Drug Allergies  Review of Systems      See HPI General:  Complains of fatigue; denies chills and fever. Eyes:  Denies blurring and discharge. ENT:  Denies hoarseness, nasal congestion, sinus pressure, and sore throat. CV:  Complains of chest pain or discomfort, fatigue, and  near fainting. Resp:  Denies cough and sputum productive. GI:  Complains of abdominal pain; denies constipation, diarrhea, nausea, and vomiting. GU:  Denies dysuria and urinary frequency. MS:  Complains of thoracic pain; denies joint pain and stiffness; reproducible . Allergy:  Denies hives or rash and itching eyes.  Physical Exam  General:  Well-developed,well-nourished,in no acute distress; alert,appropriate and cooperative throughout examination HEENT: No facial asymmetry,  EOMI, No sinus tenderness, TM's Clear, oropharynx  pink and moist.   Chest: Clear to auscultation bilaterally.  CVS: S1, S2, No murmurs, No S3.   Abd: Soft,bilateral lower quad tendrness, right greater than left , with rebound, hyperactive bowel sounds MS: Adequate ROM spine, hips, shoulders and knees.  Ext: No edema.   CNS: CN 2-12 intact, power tone and sensation normal throughout.   Skin: Intact, no visible lesions or rashes.  Psych: Good eye contact, normal affect.  Memory intact, not anxious or depressed appearing.    Impression & Recommendations:  Problem # 1:  PELVIC  PAIN (ICD-789.09) Assessment Comment Only  The following medications were removed from the medication list:    Norco 5-325 Mg Tabs (Hydrocodone-acetaminophen) .Marland Kitchen... 1 by mouth q 4 as needed pain    Ibuprofen 800 Mg Tabs (Ibuprofen) .Marland Kitchen... 1 by mouth three times a day  His updated medication list for this problem includes:    Ibuprofen 800 Mg Tabs (Ibuprofen) .Marland Kitchen... Take 1 tablet by mouth three times a day    Cyclobenzaprine Hcl 10 Mg Tabs (Cyclobenzaprine hcl) .Marland Kitchen... Take 1 tablet by mouth two times a day as needed  Orders: Radiology Referral (Radiology)  Problem # 2:  ABDOMINAL PAIN, GENERALIZED (ICD-789.07) Assessment: Comment Only  Orders: Radiology Referral (Radiology)  Problem # 3:  CHEST PAIN (ICD-786.50) Assessment: Comment Only cardiology referral for further eval, of note pt had negative enzymes in hospital at  [facility]  Complete Medication List: 1)  Ibuprofen 800 Mg Tabs (Ibuprofen) .... Take 1 tablet by mouth three times a day 2)  Cyclobenzaprine Hcl 10 Mg Tabs (Cyclobenzaprine hcl) .... Take 1 tablet by mouth two times a day as needed  Other Orders: T-Creatinine Blood ([id number]) Future Orders: Cardiology Referral (Cardiology) ... 08/20/2009  Patient Instructions: 1)  Please schedule a follow-up appointment in 1.5 month. 2)  you will be referred for a scan of your abdomen and pelvis to evaluate pain. 3)  You have spasm in your right chest , I will prescribe anti-inflammatories and muscle relaxants. 4)  Although your chest pain sounds more musculoskeletal, I will refer you for further evaluation by the cardiologist  Prescriptions: CYCLOBENZAPRINE HCL 10 MG TABS (CYCLOBENZAPRINE HCL) Take 1 tablet by mouth two times a day as needed  #20 x 0   Entered and Authorized by:   Syliva Overman MD   Signed by:   Syliva Overman MD on 08/19/2009   Method used:   Printed then faxed to ...         RxID:   [id number] IBUPROFEN 800 MG TABS (IBUPROFEN) Take 1 tablet by mouth three times a day  #30 x 0   Entered and Authorized by:   Syliva Overman MD   Signed by:   Syliva Overman MD on 08/19/2009   Method used:   Printed then faxed to ...         RxID:   [id number]

## 2010-03-02 NOTE — Miscellaneous (Signed)
Summary: labs cbcd,bmp,lipids,tsh,03/04/2009  Clinical Lists Changes  Observations: Added new observation of ABSOLUTE BAS: 0.0 K/uL (03/04/2009 13:17) Added new observation of BASOPHIL %: 0 % (03/04/2009 13:17) Added new observation of EOS ABSLT: 0.2 K/uL (03/04/2009 13:17) Added new observation of % EOS AUTO: 3 % (03/04/2009 13:17) Added new observation of ABSOLUTE MON: 0.7 K/uL (03/04/2009 13:17) Added new observation of MONOCYTE %: 10 % (03/04/2009 13:17) Added new observation of ABS LYMPHOCY: 2.2 K/uL (03/04/2009 13:17) Added new observation of LYMPHS %: 31 % (03/04/2009 13:17) Added new observation of RDW: 14.0 % (03/04/2009 13:17) Added new observation of MCHC RBC: 33.3 g/dL (16/11/9602 54:09) Added new observation of RBC M/UL: 4.71 M/uL (03/04/2009 13:17) Added new observation of CALCIUM: 9.1 mg/dL (81/19/1478 29:56) Added new observation of CREATININE: 0.75 mg/dL (21/30/8657 84:69) Added new observation of BUN: 11 mg/dL (62/95/2841 32:44) Added new observation of BG RANDOM: 78 mg/dL (02/02/7251 66:44) Added new observation of CO2 PLSM/SER: 22 meq/L (03/04/2009 13:17) Added new observation of CL SERUM: 103 meq/L (03/04/2009 13:17) Added new observation of K SERUM: 3.8 meq/L (03/04/2009 13:17) Added new observation of NA: 139 meq/L (03/04/2009 13:17) Added new observation of LDL: 34 mg/dL (03/47/4259 56:38) Added new observation of HDL: 42 mg/dL (75/64/3329 51:88) Added new observation of TRIGLYC TOT: 169 mg/dL (41/66/0630 16:01) Added new observation of CHOLESTEROL: 182 mg/dL (09/32/3557 32:20) Added new observation of TSH: 1.721 microintl units/mL (03/04/2009 13:17) Added new observation of PLATELETK/UL: 168 K/uL (03/04/2009 13:17) Added new observation of MCV: 93.6 fL (03/04/2009 13:17) Added new observation of HCT: 44.1 % (03/04/2009 13:17) Added new observation of HGB: 14.7 g/dL (25/42/7062 37:62) Added new observation of WBC COUNT: 6.9 10*3/microliter (03/04/2009 13:17)

## 2010-03-02 NOTE — Assessment & Plan Note (Signed)
Summary: f/u stress echo done on 09/17/09/tg   Visit Type:  Follow-up Referring Provider:  . Primary Provider:  Syliva Overman MD  CC:  no cardiology complaints.  History of Present Illness: [name] is here on follow-up after intial evaluation by Dr. Dietrich Pates secondary to referall by Dr. Lodema Hong for chest pain.  Review of Dr. Marvel Plan note demonstrates that the patient was seen by ER physicians in [facility] earlier this month for chest pain and was released.  On last visit, Dr. Dietrich Pates ordered a stress echo, metoprolol was discontinued and chlorothiadone was instituted.  He is here for follow-up and discussion of test results.  He is without complaints.  Current Medications (verified): 1)  Ibuprofen 800 Mg Tabs (Ibuprofen) .... Take 1 Tablet By Mouth Three Times A Day 2)  Chlorthalidone 25 Mg Tabs (Chlorthalidone) .... Take 1/2   Tablet By Mouth Daily 3)  Metoprolol Succinate 25 Mg Xr24h-Tab (Metoprolol Succinate) .... Take 1/2  Tablet By Mouth Daily  Allergies (verified): No Known Drug Allergies  Past History:  Past medical, surgical, family and social histories (including risk factors) reviewed, and no changes noted (except as noted below).  Past Medical History: Reviewed history from 09/07/2009 and no changes required. Chest pain Elevated CPK Palpitations ALCOHOL USE (ICD-305.00)10-15 beers per day on the weekends Tobacco abuse: One half pack per day; 20-pack-year history Overweight Hemorrhoids with rectal bleeding Colonic polyps removed via colonoscope  Past Surgical History: Reviewed history from 08/20/2007 and no changes required. hemmroidectomy 2002  Family History: Reviewed history from 09/07/2009 and no changes required. Mother deceased at age 45 secondary to breast carcinoma Father died at age 9 as a result of neoplastic disease of the bone Siblings: Sister has diabetes and carcinoma of the breast; 2 brothers are alive and well.  Social  History: Reviewed history from 09/07/2009 and no changes required. EMPLOYED bld technician Married; no children no smoking Alcohol-primarily beer on the weekends, sometimes in the sizable quantities. no caffeine Drug use-no  Review of Systems       All other systems have been reviewed and are negative unless stated above.   Vital Signs:  Patient profile:   50 year old male Weight:      201 pounds Pulse rate:   91 / minute BP sitting:   139 / 81  (right arm)  Vitals Entered By: Dreama Saa, CNA (September 22, 2009 1:54 PM)  Physical Exam  General:  Well developed, well nourished, in no acute distress. Lungs:  Clear bilaterally to auscultation and percussion. Heart:  Non-displaced PMI, chest non-tender; regular rate and rhythm, S1, S2 without murmurs, rubs or gallops. Carotid upstroke normal, no bruit. Normal abdominal aortic size, no bruits. Femorals normal pulses, no bruits. Pedals normal pulses. No edema, no varicosities. Abdomen:  Bowel sounds positive; abdomen soft and non-tender without masses, organomegaly, or hernias noted. No hepatosplenomegaly. Psych:  Normal affect.   Impression & Recommendations:  Problem # 1:  CHEST PAIN (ICD-786.50) Stress Echo was negative for stress induced abnormalities or ischemia.  No further cardiac w/u will be completed unless symptoms recur. His HR is in the 90's since being taken off of metoprolol.  Will restart at 12.5mg  daily.   His updated medication list for this problem includes:     Metoprolol Succinate 25 Mg Xr24h-tab (Metoprolol succinate) .Marland Kitchen... Take 1/2  tablet by mouth daily  Problem # 2:  HYPERTENSION-MILD (ICD-401.1) Essentially controlled.  Continue chlorthalidone.  We will see him in 3 months for follow-up then  release him if he is stable.  Continue follow-up with Dr. Lodema Hong. His updated medication list for this problem includes:    Chlorthalidone 25 Mg Tabs (Chlorthalidone) .Marland Kitchen... Take 1/2   tablet by mouth daily     Metoprolol Succinate 25 Mg Xr24h-tab (Metoprolol succinate) .Marland Kitchen... Take 1/2  tablet by mouth daily  Patient Instructions: 1)  Your physician recommends that you schedule a follow-up appointment in: 3 MONTHS 2)  Your physician has recommended you make the following change in your medication: REFILL CHOLTHALIDONE, METOPROLOL SUCCINATE 25MG  1/2 TABLET DAILY Prescriptions: CHLORTHALIDONE 25 MG TABS (CHLORTHALIDONE) Take 1/2   tablet by mouth daily  #15 x 6   Entered by:   Teressa Lower RN   Authorized by:   Joni Reining, NP   Signed by:   Teressa Lower RN on 09/22/2009   Method used:   Electronically to        [facility] (retail)       [address]       [place], [place]  [address]       Ph: [phone]       Fax: [phone]   RxID:   [id number] METOPROLOL SUCCINATE 25 MG XR24H-TAB (METOPROLOL SUCCINATE) Take 1/2  tablet by mouth daily  #15 x 6   Entered by:   Teressa Lower RN   Authorized by:   Joni Reining, NP   Signed by:   Teressa Lower RN on 09/22/2009   Method used:   Electronically to        [facility] (retail)       [address]       [place], [place]  [address]       Ph: [phone]       Fax: [phone]   RxID:   [id number]

## 2010-03-02 NOTE — Letter (Signed)
Summary: History form  History form   Imported By: Jacklynn Ganong 03/06/2009 09:44:23  _____________________________________________________________________  External Attachment:    Type:   Image     Comment:   External Document

## 2010-03-02 NOTE — Letter (Signed)
Summary: Appointment - Reminder 2  [facility] at [facility]. [address]   [place], [place] [address]   Phone: [phone]  Fax: [phone]     January 08, 2010 MRN: [id number]   Bryan Rodgers [address] [place], [place]  [address]   Dear [name],  Our records indicate that it is time to schedule a follow-up appointment.  Dr. Diona Browner         recommended that you follow up with [place] in   11.2011 PAST DUE         . It is very important that we reach you to schedule this appointment. We look forward to participating in your health care needs. Please contact us at the number listed above at your earliest convenience to schedule your appointment.  If you are unable to make an appointment at this time, give [place] a call so we can update our records.     Sincerely,   Glass blower/designer

## 2010-03-02 NOTE — Letter (Signed)
Summary: LABS 08/17/09  LABS 08/17/09   Imported By: Faythe Ghee 10/12/2009 12:32:13  _____________________________________________________________________  External Attachment:    Type:   Image     Comment:   External Document

## 2010-03-02 NOTE — Progress Notes (Signed)
Summary: insurance card for MRI precert  ---- Converted from flag ---- ---- 03/10/2009 11:11 AM, Cammie Sickle wrote: Patient Bryan Rodgers ord'd a new ins card 03/05/09 & was told would have in 7 to 10 days & will bring in for MRI pre-cert ------------------------------

## 2010-03-02 NOTE — Letter (Signed)
Summary: Out of Work  [facility]  [address]   [place], [place] [address]   Phone: [phone]  Fax: [phone]    August 19, 2009   Employee:  TYSEAN VANDERVLIET    To Whom It May Concern:   For Medical reasons, please excuse the above named employee from work for the following dates:  Start:   08/19/2009  End:   08/24/2009 To return without restrictions  If you need additional information, please feel free to contact our office.         Sincerely,    Milus Mallick. Lodema Hong, M.D.

## 2010-03-02 NOTE — Assessment & Plan Note (Signed)
Summary: leg   Vital Signs:  Patient profile:   50 year old male Height:      70 inches Weight:      206 pounds BMI:     29.66 O2 Sat:      97 % Pulse rate:   101 / minute Pulse rhythm:   regular Resp:     16 per minute BP sitting:   132 / 82  Vitals Entered By: Everitt Amber (March 04, 2009 9:39 AM)  Nutrition Counseling: Patient's BMI is greater than 25 and therefore counseled on weight management options. CC: was leaning over putting on his boots yesterday morning and when he went to stand up his left leg gave out. He has been having swelling and pain in his inside pasrt of knee and upper leg. Doesn't know what he did to it Pain Assessment Patient in pain? yes     Location: left leg Intensity: 10 Type: sharp Onset of pain  yesterday morning   Primary Care Provider:  Syliva Overman MD  CC:  was leaning over putting on his boots yesterday morning and when he went to stand up his left leg gave out. He has been having swelling and pain in his inside pasrt of knee and upper leg. Doesn't know what he did to it.  History of Present Illness: 2 day h/o acute left knwee pain, no specific trauma noted to the knee. 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  Preventive Screening-Counseling & Management  Alcohol-Tobacco     Smoking Cessation Counseling: yes  Current Medications (verified): 1)  None  Allergies (verified): No Known Drug Allergies  Review of Systems      See HPI General:  Denies chills and fever. CV:  Denies chest pain or discomfort, palpitations, and swelling of feet. Resp:  Denies cough and sputum productive. MS:  See HPI. Derm:  Denies itching, lesion(s), and rash. Psych:  Denies anxiety and depression. Heme:  Denies abnormal bruising and bleeding. Allergy:  Denies seasonal allergies.  Physical Exam  General:  Well-developed,well-nourished,in no acute  distress; alert,appropriate and cooperative throughout examination HEENT: No facial asymmetry,  EOMI, No sinus tenderness, TM's Clear, oropharynx  pink and moist.   Chest: Clear to auscultation bilaterally.  CVS: S1, S2, No murmurs, No S3.   Abd: Soft, Nontender.  MS: Adequate ROM spine, hips, shoulders and reduced in  left  knee with medial tenderness and swelling.  Ext: No edema.   CNS: CN 2-12 intact, power tone and sensation normal throughout.   Skin: Intact, no visible lesions or rashes.  Psych: Good eye contact, normal affect.  Memory intact, not anxious or depressed appearing.    Impression & Recommendations:  Problem # 1:  KNEE PAIN, LEFT (ICD-719.46) Assessment Comment Only  The following medications were removed from the medication list:    Hydrocodone-acetaminophen 5-325 Mg Tabs (Hydrocodone-acetaminophen) .Marland Kitchen... Take 1-2 tabs every 4-6 hours as needed    Cyclobenzaprine Hcl 5 Mg Tabs (Cyclobenzaprine hcl) .Marland Kitchen... Take oen tab by mouth three times a day for 5 days    Ibuprofen 800 Mg Tabs (Ibuprofen) ..... One tab by mouth tid  Orders: T-Uric Acid (Blood) ([address]) Orthopedic Referral (Ortho)  The following medications were removed from the medication list:    Hydrocodone-acetaminophen 5-325 Mg Tabs (Hydrocodone-acetaminophen) .Marland Kitchen... Take 1-2 tabs every 4-6 hours as needed    Cyclobenzaprine Hcl 5 Mg Tabs (Cyclobenzaprine  hcl) ..... Take oen tab by mouth three times a day for 5 days    Ibuprofen 800 Mg Tabs (Ibuprofen) ..... One tab by mouth tid  Problem # 2:  NICOTINE ADDICTION (ICD-305.1) Assessment: Unchanged  Orders: Orthopedic Referral (Ortho)  Encouraged smoking cessation and discussed different methods for smoking cessation.   Problem # 3:  OBESITY (ICD-278.00) Assessment: Deteriorated  Orders: T-Lipid Profile ([address]) Orthopedic Referral (Ortho)  Ht: 70 (03/04/2009)   Wt: 206 (03/04/2009)   BMI: 29.66 (03/04/2009)  Other Orders: T-Basic  Metabolic Panel [phone]) T-CBC w/Diff [phone]) T-PSA [phone]) T-TSH [phone])  Patient Instructions: 1)  cPE i 4 months. 2)  you are being referred to orthopedics for attention to the knee. 3)  Tobacco is very bad for your health and your loved ones! You Should stop smoking!. 4)  Stop Smoking Tips: Choose a Quit date. Cut down before the Quit date. decide what you will do as a substitute when you feel the urge to smoke(gum,toothpick,exercise). 5)  It is important that you exercise regularly at least 20 minutes 5 times a week. If you develop chest pain, have severe difficulty breathing, or feel very tired , stop exercising immediately and seek medical attention. 6)  You need to lose weight. Consider a lower calorie diet and regular exercise.  7)  BMP prior to visit, ICD-9: 8)  Lipid Panel prior to visit, ICD-9: 9)  TSH prior to visit, ICD-9:   fasting labs first week in April 10)  CBC w/ Diff prior to visit, ICD-9: 11)  PSA prior to visit, ICD-9: 12)  uric acid level today.

## 2010-03-02 NOTE — Letter (Signed)
Summary: Generic Letter  Architectural technologist at [facility] [facility]  [address], [place] [address]   Phone: [phone]  Fax: [phone]        September 15, 2009 MRN: [id number]    Bryan Rodgers [address] [place], [place]  [address]    Dear [name],  WE HAVE BEEN TRYING TO REACH YOU SINCE 08/12/09. YOU HAVE A PERSCRIPTION OF PLAVIX WAITING FOR YOU TO PICK UP AT THE FRONT DESK IN [facility]. PLEASE CALL OUR OFFICE [phone] TO LET us KNOW WHEN YOU WILL BE ABLE TO PICK IT UP.  THANK YOU   FRONT OFFICE STAFF AT [facility]         Sincerely,  Faythe Ghee  This letter has been electronically signed by your physician.

## 2010-03-03 ENCOUNTER — Ambulatory Visit: Payer: Self-pay | Admitting: Gastroenterology

## 2010-03-03 ENCOUNTER — Ambulatory Visit: Admit: 2010-03-03 | Payer: Self-pay | Admitting: Gastroenterology
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
## 2010-03-09 ENCOUNTER — Ambulatory Visit (HOSPITAL_COMMUNITY)
Admission: RE | Admit: 2010-03-09 | Discharge: 2010-03-09 | Disposition: A | Discharge status: Home / Self Care | Payer: 59 | Source: Ambulatory Visit | Attending: Sports Medicine | Admitting: Sports Medicine

## 2010-03-09 ENCOUNTER — Ambulatory Visit (HOSPITAL_COMMUNITY)
Admission: RE | Admit: 2010-03-09 | Discharge: 2010-03-09 | Disposition: A | Discharge status: Home / Self Care | Payer: 59 | Source: Ambulatory Visit | Attending: Physical Therapy | Admitting: Physical Therapy

## 2010-03-09 DIAGNOSIS — M545 Low back pain, unspecified: Secondary | ICD-10-CM | POA: Insufficient documentation

## 2010-03-09 DIAGNOSIS — M6281 Muscle weakness (generalized): Secondary | ICD-10-CM | POA: Insufficient documentation

## 2010-03-09 DIAGNOSIS — IMO0001 Reserved for inherently not codable concepts without codable children: Secondary | ICD-10-CM | POA: Insufficient documentation

## 2010-03-09 DIAGNOSIS — M542 Cervicalgia: Secondary | ICD-10-CM | POA: Insufficient documentation

## 2010-03-10 NOTE — Letter (Signed)
Summary: murphy/wainer  murphy/wainer   Imported By: Lind Guest 03/03/2010 11:06:31  _____________________________________________________________________  External Attachment:    Type:   Image     Comment:   External Document
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
## 2010-03-12 ENCOUNTER — Ambulatory Visit (HOSPITAL_COMMUNITY)
Admission: RE | Admit: 2010-03-12 | Discharge: 2010-03-12 | Disposition: A | Discharge status: Home / Self Care | Payer: 59 | Source: Ambulatory Visit

## 2010-03-15 ENCOUNTER — Ambulatory Visit (HOSPITAL_COMMUNITY)
Admission: RE | Admit: 2010-03-15 | Discharge: 2010-03-15 | Disposition: A | Discharge status: Home / Self Care | Payer: 59 | Source: Ambulatory Visit | Attending: Physical Therapy | Admitting: Physical Therapy

## 2010-03-17 ENCOUNTER — Ambulatory Visit (HOSPITAL_COMMUNITY)
Admission: RE | Admit: 2010-03-17 | Discharge: 2010-03-17 | Disposition: A | Discharge status: Home / Self Care | Payer: 59 | Source: Ambulatory Visit | Attending: Sports Medicine | Admitting: Sports Medicine

## 2010-03-19 ENCOUNTER — Ambulatory Visit (HOSPITAL_COMMUNITY): Payer: 59 | Admitting: Physical Therapy

## 2010-03-22 ENCOUNTER — Ambulatory Visit (HOSPITAL_COMMUNITY): Payer: 59 | Admitting: Physical Therapy

## 2010-05-12 ENCOUNTER — Encounter: Payer: Self-pay | Admitting: *Deleted

## 2010-06-03 ENCOUNTER — Other Ambulatory Visit: Payer: Self-pay | Admitting: *Deleted

## 2010-06-15 ENCOUNTER — Ambulatory Visit: Payer: Self-pay | Admitting: Family Medicine

## 2010-06-15 NOTE — Assessment & Plan Note (Signed)
NAME:  PATTY, LOPEZGARCIA               CHART#:  [id number]   DATE:  03/13/2007                       DOB:  1960-05-31   REFERRING PHYSICIAN:  Milus Mallick. Lodema Hong, M.D.   PROBLEM LIST:  1. Rectal bleeding secondary to internal hemorrhoids.  2. Colon polyps in 2004.  Last colonoscopy in 2007 without polyps.  3. Genital warts in 2004.  4. History of alcohol use.   SUBJECTIVE:  Mr. Sermersheim is a 50 year old male who presents for return  patient visit.  He was last seen in February 2008.  He is complaining of  rectal bleeding.  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  He has some rectal swelling and discomfort.  He used the Anusol  [facility] suppositories, but he only uses them at night time and still gets  symptomatic relief.  He continues to take iron only if he feels  sluggish.  The blood that he sees sometimes is in the bowl, but mostly  on the tissue.   MEDICATIONS:  1. Pain pills as needed.  2. Iron as needed.   OBJECTIVE:  Weight 201 pounds, up 4 pounds since August 2007, height 5  feet 10 inches, BMI 28.8 (overweight).  Temperature 98.1, blood pressure  158/102, pulse 96.GENERAL:  In no apparent distress.  Alert and oriented  x4.  LUNGS:  Clear to auscultation bilaterally. CARDIOVASCULAR:  Regular  rhythm with no murmur.  Normal S1, S2.  ABDOMEN:  Bowel sounds present,  soft, nontender.  Nondistended.   ASSESSMENT:  Mr. Clink is a 50 year old male with rectal bleeding  likely secondary to his internal hemorrhoids.  His hemorrhoidal  exacerbations are brought on by the activities he performs at his job.  His blood pressure is not ideally controlled.  Thank you for allowing me  to see Mr. Forgette in consultation.  My recommendations to follow.   RECOMMENDATIONS:  1. He should follow up with Dr. Lodema Hong for a blood pressure check      this week.  2. He should continue the Anusol [facility] suppositories as needed for rectal      discomfort, swelling, and bleeding.  3. We  will check a CBC today.  If his hemorrhoidal bleeding is causing      anemia, then he      should consider hemorrhoidectomy.  4. Return patient visit in 6 months.       Kassie Mends, M.D.  Electronically Signed     SM/MEDQ  D:  03/14/2007  T:  03/15/2007  Job:  [id number]   cc:   Milus Mallick. Lodema Hong, M.D.

## 2010-06-18 ENCOUNTER — Other Ambulatory Visit: Payer: Self-pay | Admitting: *Deleted

## 2010-06-18 MED ORDER — CHLORTHALIDONE 25 MG PO TABS: 12.5000 mg | ORAL_TABLET | Freq: Every day | ORAL | Status: DC

## 2010-06-18 NOTE — Op Note (Signed)
NAME:  Bryan Rodgers, Bryan Rodgers                        ACCOUNT NO.:  [id number]   MEDICAL RECORD NO.:  [id number]                   PATIENT TYPE:  AMB   LOCATION:  [facility]                                 FACILITY:  [facility]   PHYSICIAN:  Lindaann Slough, M.D.               DATE OF BIRTH:  Aug 13, 1960   DATE OF PROCEDURE:  12/03/2002  DATE OF DISCHARGE:                                 OPERATIVE REPORT   PREOPERATIVE DIAGNOSIS:  Genital condylomata of urethral meatus, dorsum of  penis, right perineum.   POSTOPERATIVE DIAGNOSIS:  Genital condylomata of urethral meatus, dorsum of  penis, right perineum.   PROCEDURES:  1. Excision of right perineal condylomata.  2. CO2 laser ablation of penile shaft and urethral meatal condylomata.  3. Cystoscopy.   SURGEON:  Lindaann Slough, M.D.   ASSISTANT:  Susanne Borders, M.D.   ANESTHESIA:  Laryngeal mask airway.   COMPLICATIONS:  None.   SPECIMENS:  Perineal condylomata to pathology.   DISPOSITION:  To postanesthesia care unit in stable condition.   INDICATION FOR PROCEDURE:  Bryan Rodgers is a 50 year old male who has been  treated for genital condylomata in the past.  He was recently referred to  Dr. Brunilda Payor and was found to have the three aforementioned lesions.  He wished  to have these removed.  Because of the larger size of the perineal  condylomata, excision of that one was recommended rather than CO2 laser  ablation.  The other two were of sufficient size that they could __________  be removed with laser.  The risks and benefits of the procedure were  discussed with the patient, and he gave informed consent after understanding  all.   DESCRIPTION OF PROCEDURE:  The patient was brought to the operating room and  correctly identified by his identification bracelet.  He was given  preoperative antibiotics and placed in the dorsal lithotomy position and  prepped and draped in typical sterile fashion.  A 15 blade knife was used to  make an elliptical  incision around the right perineal condylomata.  The base  of the lesion was then transected with Metzenbaum scissors.  The entire  thickness of the skin was removed.  The wound was then approximated with a 3-  0 Vicryl and the skin edge was reapproximated with a 4-0 Monocryl in a  running fashion.  Collodion was applied to the wound.  Next the CO2 laser  was used to fulgurate the lesion at the dorsum of the penis and the lesion  at the urethral meatus.  A power setting of 10 watts was used.  There was no  bleeding from these lesions after the laser was complete, and the lesions  were satisfactorily removed.  Cystoscopy was then performed to rule out any  lesions within the urethra or bladder.  The anterior and posterior urethra  were both within normal limits.  The bladder mucosa demonstrated no mucosal  abnormalities, tumors, stones, or foreign bodies.  Bilateral ureteral  orifices were seen in their normal anatomic  location, each effluxing clear urine.  The patient's bladder was then  drained and he was awakened from his anesthesia and taken to the  postanesthesia care unit in stable condition.  Please note that Dr. Brunilda Payor was  present and participated in all aspects of this case, as he was the primary  surgeon.     Susanne Borders, MD                           Lindaann Slough, M.D.    DR/MEDQ  D:  12/03/2002  T:  12/03/2002  Job:  [id number]

## 2010-06-18 NOTE — Op Note (Signed)
NAME:  Bryan Rodgers, Bryan Rodgers              ACCOUNT NO.:  [id number]   MEDICAL RECORD NO.:  [id number]          PATIENT TYPE:  AMB   LOCATION:  DAY                           FACILITY:  [facility]   PHYSICIAN:  Kassie Mends, M.D.      DATE OF BIRTH:  September 14, 1960   DATE OF PROCEDURE:  09/16/2005  DATE OF DISCHARGE:                                 OPERATIVE REPORT   PROCEDURE:  Colonoscopy.   INDICATIONS FOR EXAM:  Mr. Taillon is a 50 year old male, who presents with  rectal bleeding.  He had a history of colon polyp in 2004.   MEDICATIONS:  1. Demerol 75 mg IV.  2. Versed 6 mg IV.   FINDINGS:  1. Moderate-sized internal hemorrhoids with evidence of active bleeding on      initial rectal exam.  2. Otherwise normal colon without evidence of polyps, inflammatory changes      or vascular ectasias.  No evidence of rectal polyps.   RECOMMENDATIONS:  1. High-fiber diet and hand-out given on management of hemorrhoids.  2. Have prescribed Anusol HC one pr BID for the next 14 days.  3. Recommend Colace 100 mg p.o. twice daily for two weeks.  4. He may use sitz baths as needed.  5. No lifting greater than 10 pounds without assistance for two weeks.  6. Follow up with Dr. Syliva Overman. Will need referral to surgery to      discuss need for hemorrhoidectomy if bleeding persists.   PROCEDURE TECHNIQUE:  Physical exam was performed and informed consent was  obtained from the patient after explaining all the benefits, risks and  alternatives to the procedure.  The patient was connected to the monitor and  placed in the left lateral position.  Continuous oxygen was provided by  nasal cannula and IV medicine administered through an indwelling cannula.  After administration of sedation, a rectal exam was performed.  The  scope was advanced, under direct visualization to the cecum.  The scope was  subsequently removed slowly by carefully examining the color, texture,  anatomy and integrity of the mucosa on  the way out.  The patient was  recovered in the endoscopy suite and discharged to home in satisfactory  condition.      Kassie Mends, M.D.  Electronically Signed     SM/MEDQ  D:  09/16/2005  T:  09/16/2005  Job:  [id number]   cc:   Milus Mallick. Lodema Hong, M.D.  Fax: [phone]

## 2010-06-18 NOTE — Consult Note (Signed)
NAME:  Bryan Rodgers, Bryan Rodgers NO.:  [id number]   MEDICAL RECORD NO.:  [id number]          PATIENT TYPE:  OUT   LOCATION:  RAD                           FACILITY:  [facility]   PHYSICIAN:  Kassie Mends, M.D.      DATE OF BIRTH:  05-Aug-1960   DATE OF CONSULTATION:  09/13/2005  DATE OF DISCHARGE:                                   CONSULTATION   Dear Dr. Lodema Hong:   I am seeing Bryan Rodgers as a new patient consultation per your request.  I  am seeing him for rectal bleeding.   Bryan Rodgers is a 50 year old male with report of bright red blood from his  rectum every day with every bowel movement for the last 2-3 weeks.  He  reports that sometime the commode is full.  Sometimes he coughs, and blood  flows.  He denies any rectal pain, urgency, or itching.  He does not feel  any hemorrhoids.  He has 2 normally formed bowel movements daily.  He is  unable to identify any precipitating factors.  He has significant past  medical history of painful hemorrhoids requiring hemorrhoidectomy in 2003.  He denies any strenuous activity, pushing or pulling, lifting heavy objects.   PAST MEDICAL HISTORY:  1. Genital warts which were removed in 2004.  2. History of polyps on colonoscopy performed by Dr.  Katrinka Blazing 3 years ago.      He is unsure what the pathology report showed.   He is not allergic to any medicines.  He is taking some sample pain pills.  He has no family history of colon cancer or colon polyps.  He is married and  has no children.  He works for [employer].  He smokes 1/2  pack a day.  He drinks a 6-pack a day.   REVIEW OF SYSTEMS:  As per HPI, otherwise all systems are negative.   PHYSICAL EXAMINATION:  VITAL SIGNS: Weight 197 pounds.  Height 5 feet 10  inches.  Temperature  98.2, blood pressure 126/80, pulse 68.  GENERAL:  No apparent distress.  Alert and oriented x4.HEENT:  Atraumatic  and normocephalic.  Pupils equal, round, and reactive to light.  Mouth: No  oral lesions.  Posterior pharynx without erythema or exudate.  NECK:  Full  range of motion and no lymphadenopathy.LUNGS: Clear to auscultation  bilaterally.CARDIOVASCULAR: Regular rhythm without murmur.  Normal S1 and  S2.ABDOMEN:  Bowel sounds are present.  Soft, nontender, nondistended.  No  rebound or guarding, no hepatosplenomegaly.EXTREMITIES: Without cyanosis,  clubbing, or edema. NEUROLOGIC:  No focal deficits.  RECTAL:  Exam showed soft stool in the vault.  No palpable masses.  Prostate  nontender, normal size.  Possible small internal hemorrhoids.  Hemoccult  positive.   Bryan Rodgers is a 50 year old male with rectal bleeding.  The differential  diagnoses for his rectal bleeding include polyps, hemorrhoids, and a low  likelihood of rectal cancer or anal fissure.  He also has a significant  daily consumption of alcohol and may have some degree of portal hypertension  which may lead to rectal varices.  Thank you for allowing me to see Mr.  Rodgers in consultation.  My recommendations follow.   Bryan Rodgers will be scheduled for colonoscopy on Friday, August 17.  I will  check a hepatic function panel and PT/INR today.  His labs were reviewed  from August 2007 which revealed a hemoglobin of 16.3 and a creatinine of  0.9.  A followup visit in one month.  I will also schedule an abdominal  ultrasound to evaluate for cirrhosis and changes consistent with chronic  pancreatitis.   Please feel free to contact me with questions at [phone].      Kassie Mends, M.D.  Electronically Signed     SM/MEDQ  D:  09/13/2005  T:  09/13/2005  Job:  [id number]

## 2010-07-07 ENCOUNTER — Encounter: Payer: Self-pay | Admitting: Family Medicine

## 2010-07-08 ENCOUNTER — Other Ambulatory Visit: Payer: Self-pay | Admitting: Family Medicine

## 2010-07-08 ENCOUNTER — Encounter: Payer: Self-pay | Admitting: Family Medicine

## 2010-07-08 ENCOUNTER — Ambulatory Visit (INDEPENDENT_AMBULATORY_CARE_PROVIDER_SITE_OTHER): Payer: 59 | Admitting: Family Medicine

## 2010-07-08 VITALS — BP 108/80 | HR 80 | Resp 16 | Ht 70.5 in | Wt 197.0 lb

## 2010-07-08 DIAGNOSIS — R5383 Other fatigue: Secondary | ICD-10-CM

## 2010-07-08 DIAGNOSIS — R5381 Other malaise: Secondary | ICD-10-CM

## 2010-07-08 DIAGNOSIS — F329 Major depressive disorder, single episode, unspecified: Secondary | ICD-10-CM

## 2010-07-08 DIAGNOSIS — Z13220 Encounter for screening for lipoid disorders: Secondary | ICD-10-CM

## 2010-07-08 DIAGNOSIS — N529 Male erectile dysfunction, unspecified: Secondary | ICD-10-CM

## 2010-07-08 DIAGNOSIS — F1010 Alcohol abuse, uncomplicated: Secondary | ICD-10-CM

## 2010-07-08 DIAGNOSIS — I10 Essential (primary) hypertension: Secondary | ICD-10-CM

## 2010-07-08 DIAGNOSIS — F32A Depression, unspecified: Secondary | ICD-10-CM

## 2010-07-08 LAB — CBC WITH DIFFERENTIAL/PLATELET
Basophils Absolute: 0.1 10*3/uL (ref 0.0–0.1)
Basophils Relative: 1 % (ref 0–1)
Eosinophils Absolute: 0.4 10*3/uL (ref 0.0–0.7)
Hemoglobin: 14.9 g/dL (ref 13.0–17.0)
MCH: 32 pg (ref 26.0–34.0)
MCHC: 33.6 g/dL (ref 30.0–36.0)
Monocytes Relative: 8 % (ref 3–12)
Neutro Abs: 2.3 10*3/uL (ref 1.7–7.7)
Neutrophils Relative %: 32 % — ABNORMAL LOW (ref 43–77)
Platelets: 292 10*3/uL (ref 150–400)
RBC: 4.65 MIL/uL (ref 4.22–5.81)
RDW: 12.9 % (ref 11.5–15.5)
WBC: 7.1 10*3/uL (ref 4.0–10.5)

## 2010-07-08 LAB — LIPID PANEL
Cholesterol: 180 mg/dL (ref 0–200)
Total CHOL/HDL Ratio: 5 Ratio
Triglycerides: 450 mg/dL — ABNORMAL HIGH (ref <150)

## 2010-07-08 LAB — BASIC METABOLIC PANEL
Calcium: 10.4 mg/dL (ref 8.4–10.5)
Glucose, Bld: 89 mg/dL (ref 70–99)
Potassium: 4.4 mEq/L (ref 3.5–5.3)
Sodium: 140 mEq/L (ref 135–145)

## 2010-07-08 MED ORDER — CHLORTHALIDONE 25 MG PO TABS: ORAL_TABLET | ORAL | Status: DC

## 2010-07-08 MED ORDER — TADALAFIL 5 MG PO TABS: ORAL_TABLET | ORAL | Status: DC

## 2010-07-08 MED ORDER — METOPROLOL SUCCINATE ER 25 MG PO TB24: ORAL_TABLET | ORAL | Status: DC

## 2010-07-08 NOTE — Progress Notes (Signed)
  Subjective:    Patient ID: Bryan Rodgers, male    DOB: 23-Nov-1960, 50 y.o.   MRN: [id number]  HPI 6 month h/o difficulty maintaining erections, desire is good but ability to perform both in attaining and maintaining an erection continues to deteriorate. Pt also reports marital problems and is desirous of counseling as a couple. States he loves his wife and wants to stay with her.  The PT is here for follow up and re-evaluation of chronic medical conditions, medication management and review of any  recent lab and radiology data.  Preventive health is updated, specifically  Cancer screening, Osteoporosis screening and Immunization.   Questions or concerns regarding consultations or procedures which the PT has had in the interim are  addressed. The PT denies any adverse reactions to current medications since the last visit.      Review of Systems Denies recent fever or chills. Denies sinus pressure, nasal congestion, ear pain or sore throat. Denies chest congestion, productive cough or wheezing. Denies chest pains, palpitations, paroxysmal nocturnal dyspnea, orthopnea and leg swelling Denies abdominal pain, nausea, vomiting,diarrhea or constipation.  Denies rectal bleeding or change in bowel movement. Denies dysuria, frequency, hesitancy or incontinence. Denies joint pain, swelling and limitation in mobility. Denies headaches, seizure, numbness, or tingling. Denies skin break down or rash.        Objective:   Physical Exam Patient alert and oriented and in no Cardiopulmonary distress.  HEENT: No facial asymmetry, EOMI, no sinus tenderness, TM's clear, Oropharynx pink and moist.  Neck supple no adenopathy.  Chest: Clear to auscultation bilaterally.  CVS: S1, S2 no murmurs, no S3.  ABD: Soft non tender. Bowel sounds normal.  Ext: No edema  MS: Adequate ROM spine, shoulders, hips and knees.  Skin: Intact, no ulcerations or rash noted.  Psych: Good eye contact, normal  affect. Memory intact not anxious or depressed appearing.  CNS: CN 2-12 intact, power, tone and sensation normal throughout.        Assessment & Plan:

## 2010-07-08 NOTE — Assessment & Plan Note (Signed)
Controlled, no change in medication  

## 2010-07-08 NOTE — Patient Instructions (Signed)
F/I in 2.5 months  CBC, fasting chem 7, lipid, testosterone level   New med for erectile dysfunction as discussed.  You will be referred for counselling

## 2010-07-09 LAB — LDL CHOLESTEROL, DIRECT: Direct LDL: 48 mg/dL

## 2010-07-15 ENCOUNTER — Ambulatory Visit: Payer: 59 | Admitting: Cardiology

## 2010-07-17 NOTE — Assessment & Plan Note (Signed)
Improved, uses significantly less often and in smaller amts than in the past

## 2010-07-17 NOTE — Assessment & Plan Note (Signed)
Deteriorated, requests med

## 2010-09-02 ENCOUNTER — Encounter: Payer: Self-pay | Admitting: Gastroenterology

## 2010-09-27 ENCOUNTER — Encounter: Payer: Self-pay | Admitting: *Deleted

## 2010-09-27 ENCOUNTER — Encounter: Payer: Self-pay | Admitting: Family Medicine

## 2010-09-27 ENCOUNTER — Ambulatory Visit (HOSPITAL_COMMUNITY)
Admission: RE | Admit: 2010-09-27 | Discharge: 2010-09-27 | Disposition: A | Discharge status: Home / Self Care | Payer: 59 | Source: Ambulatory Visit | Attending: Family Medicine | Admitting: Family Medicine

## 2010-09-27 ENCOUNTER — Ambulatory Visit (INDEPENDENT_AMBULATORY_CARE_PROVIDER_SITE_OTHER): Payer: 59 | Admitting: Family Medicine

## 2010-09-27 VITALS — BP 126/60 | HR 99 | Resp 16 | Ht 70.0 in | Wt 206.1 lb

## 2010-09-27 DIAGNOSIS — J309 Allergic rhinitis, unspecified: Secondary | ICD-10-CM

## 2010-09-27 DIAGNOSIS — I10 Essential (primary) hypertension: Secondary | ICD-10-CM

## 2010-09-27 DIAGNOSIS — R059 Cough, unspecified: Secondary | ICD-10-CM | POA: Insufficient documentation

## 2010-09-27 DIAGNOSIS — R05 Cough: Secondary | ICD-10-CM | POA: Insufficient documentation

## 2010-09-27 DIAGNOSIS — J329 Chronic sinusitis, unspecified: Secondary | ICD-10-CM

## 2010-09-27 DIAGNOSIS — J40 Bronchitis, not specified as acute or chronic: Secondary | ICD-10-CM

## 2010-09-27 MED ORDER — CEFTRIAXONE SODIUM 500 MG IJ SOLR
500.0000 mg | Freq: Once | INTRAMUSCULAR | Status: AC
  Administered 2010-09-27: 500 mg via INTRAMUSCULAR

## 2010-09-27 MED ORDER — METHYLPREDNISOLONE ACETATE 80 MG/ML IJ SUSP
80.0000 mg | Freq: Once | INTRAMUSCULAR | Status: AC
  Administered 2010-09-27: 80 mg via INTRAMUSCULAR

## 2010-09-27 MED ORDER — PREDNISONE (PAK) 5 MG PO TABS: 5.0000 mg | ORAL_TABLET | ORAL | Status: DC

## 2010-09-27 MED ORDER — PENICILLIN V POTASSIUM 500 MG PO TABS: 500.0000 mg | ORAL_TABLET | Freq: Three times a day (TID) | ORAL | Status: AC

## 2010-09-27 NOTE — Patient Instructions (Addendum)
F/u as before.  You are being treated for uncontrolled allergies and acute sinusitis and bronchitis.  Injections in the office, CXR and medication is prescribed.  Return to work in am  Take mucinex twice daily per directions on bottle

## 2010-09-27 NOTE — Progress Notes (Signed)
  Subjective:    Patient ID: Bryan Rodgers, male    DOB: [birth year], 50 y.o.   MRN: [id number]  HPI Head and chest congestion , worse at night for the past 3 days, has had  intermittent chills, but no documented fever.drainage and sputum are green. Pt was recently sent home from work due to symptoms, and wants to return as soon as possible. He reports increased allergy symptoms of nasal congestion with clear drainage , and excessive sneezing over he past 2 weeks   Review of Systems    See HPI  Denies chest pains, palpitations and leg swelling Denies abdominal pain, nausea, vomiting,diarrhea or constipation.   Denies dysuria, frequency, hesitancy or incontinence. Denies joint pain, swelling and limitation in mobility. Denies headaches, seizures, numbness, or tingling. Denies depression, anxiety or insomnia. Denies skin break down or rash.     Objective:   Physical Exam  Patient alert and oriented and in no cardiopulmonary distress.  HEENT: No facial asymmetry, EOMI, maxillary  sinus tenderness,  oropharynx pink and moist.  Neck supple anterior cervical  adenopathy.  Chest:decreased air entry, scattered crackles, no wheezes  CVS: S1, S2 no murmurs, no S3.  ABD: Soft non tender. Bowel sounds normal.  Ext: No edema  MS: Adequate ROM spine, shoulders, hips and knees.  Skin: Intact, no ulcerations or rash noted.  Psych: Good eye contact, normal affect. Memory intact not anxious or depressed appearing.  CNS: CN 2-12 intact, power, tone and sensation normal throughout.       Assessment & Plan:

## 2010-10-10 DIAGNOSIS — J329 Chronic sinusitis, unspecified: Secondary | ICD-10-CM | POA: Insufficient documentation

## 2010-10-10 DIAGNOSIS — J309 Allergic rhinitis, unspecified: Secondary | ICD-10-CM | POA: Insufficient documentation

## 2010-10-10 NOTE — Assessment & Plan Note (Signed)
Antibiotic prescribed and administered 

## 2010-10-10 NOTE — Assessment & Plan Note (Signed)
Antibiotics and decongestants prescribed, work excuse x 1 day

## 2010-10-10 NOTE — Assessment & Plan Note (Signed)
Uncontrolled med prescribed 

## 2010-10-10 NOTE — Assessment & Plan Note (Signed)
Controlled, no change in medication  

## 2010-10-18 ENCOUNTER — Encounter: Payer: Self-pay | Admitting: Family Medicine

## 2010-10-19 ENCOUNTER — Ambulatory Visit: Payer: 59 | Admitting: Family Medicine

## 2010-11-05 LAB — INFLUENZA A+B VIRUS AG-DIRECT(RAPID)
Inflenza A Ag: POSITIVE — AB
Influenza B Ag: NEGATIVE

## 2010-12-15 ENCOUNTER — Other Ambulatory Visit: Payer: Self-pay

## 2010-12-15 MED ORDER — CHLORTHALIDONE 25 MG PO TABS: ORAL_TABLET | ORAL | Status: DC

## 2011-01-20 ENCOUNTER — Other Ambulatory Visit: Payer: Self-pay | Admitting: Family Medicine

## 2011-03-10 ENCOUNTER — Ambulatory Visit (INDEPENDENT_AMBULATORY_CARE_PROVIDER_SITE_OTHER): Payer: 59 | Admitting: Family Medicine

## 2011-03-10 ENCOUNTER — Encounter: Payer: Self-pay | Admitting: Family Medicine

## 2011-03-10 VITALS — BP 120/80 | HR 80 | Resp 16 | Ht 70.0 in | Wt 207.0 lb

## 2011-03-10 DIAGNOSIS — R7301 Impaired fasting glucose: Secondary | ICD-10-CM

## 2011-03-10 DIAGNOSIS — K219 Gastro-esophageal reflux disease without esophagitis: Secondary | ICD-10-CM

## 2011-03-10 DIAGNOSIS — G4730 Sleep apnea, unspecified: Secondary | ICD-10-CM

## 2011-03-10 DIAGNOSIS — E559 Vitamin D deficiency, unspecified: Secondary | ICD-10-CM

## 2011-03-10 DIAGNOSIS — M1990 Unspecified osteoarthritis, unspecified site: Secondary | ICD-10-CM | POA: Insufficient documentation

## 2011-03-10 DIAGNOSIS — B369 Superficial mycosis, unspecified: Secondary | ICD-10-CM | POA: Insufficient documentation

## 2011-03-10 DIAGNOSIS — I10 Essential (primary) hypertension: Secondary | ICD-10-CM

## 2011-03-10 DIAGNOSIS — M129 Arthropathy, unspecified: Secondary | ICD-10-CM

## 2011-03-10 MED ORDER — IBUPROFEN 800 MG PO TABS: ORAL_TABLET | ORAL | Status: DC

## 2011-03-10 MED ORDER — CLOTRIMAZOLE-BETAMETHASONE 1-0.05 % EX CREA: TOPICAL_CREAM | Freq: Two times a day (BID) | CUTANEOUS | Status: DC

## 2011-03-10 MED ORDER — OMEPRAZOLE 40 MG PO CPDR: 40.0000 mg | DELAYED_RELEASE_CAPSULE | Freq: Every day | ORAL | Status: DC

## 2011-03-10 NOTE — Patient Instructions (Signed)
F/u in 4 month. CBC, lipid, chem 7, tsh, psa , vit D, HBa1C today, h pylori  You are being treated for reflux. Pls stop caffeine and nicotine  You need to have a sleep study.  Med is sent in for rash on left arm pls stop all other ointments  EKG is normal  It is important that you exercise regularly at least 30 minutes 5 times a week. If you develop chest pain, have severe difficulty breathing, or feel very tired, stop exercising immediately and seek medical attention  A healthy diet is rich in fruit, vegetables and whole grains. Poultry fish, nuts and beans are a healthy choice for protein rather then red meat. A low sodium diet and drinking 64 ounces of water daily is generally recommended. Oils and sweet should be limited. Carbohydrates especially for those who are diabetic or overweight, should be limited to 34-45 gram per meal. It is important to eat on a regular schedule, at least 3 times daily. Snacks should be primarily fruits, vegetables or nuts.

## 2011-03-11 DIAGNOSIS — E559 Vitamin D deficiency, unspecified: Secondary | ICD-10-CM | POA: Insufficient documentation

## 2011-03-11 LAB — BASIC METABOLIC PANEL
BUN: 17 mg/dL (ref 6–23)
CO2: 25 mEq/L (ref 19–32)
Chloride: 99 mEq/L (ref 96–112)
Glucose, Bld: 75 mg/dL (ref 70–99)
Potassium: 4.3 mEq/L (ref 3.5–5.3)

## 2011-03-11 LAB — LIPID PANEL
HDL: 51 mg/dL (ref >39)
Total CHOL/HDL Ratio: 4.1 Ratio
Triglycerides: 203 mg/dL — ABNORMAL HIGH (ref <150)

## 2011-03-11 LAB — CBC
Platelets: 199 10*3/uL (ref 150–400)
RBC: 4.83 MIL/uL (ref 4.22–5.81)
RDW: 13.9 % (ref 11.5–15.5)
WBC: 8.5 10*3/uL (ref 4.0–10.5)

## 2011-03-11 LAB — PSA: PSA: 6.61 ng/mL — ABNORMAL HIGH (ref <=4.00)

## 2011-03-11 MED ORDER — VITAMIN D3 1.25 MG (50000 UT) PO CAPS: 50000.0000 [IU] | ORAL_CAPSULE | ORAL | Status: DC

## 2011-03-20 NOTE — Assessment & Plan Note (Signed)
Symptoms highly suggestive of sleep apnea, importance of treatment if pt has thos is explained, he is referred for a sleep study

## 2011-03-20 NOTE — Assessment & Plan Note (Signed)
Uncontrolled, reports symptoms of heartburn worse in the supine position. Counseled to reduce caffeine and not eat sooner than 2 hrs before bedtime , will try medication

## 2011-03-20 NOTE — Assessment & Plan Note (Signed)
Fungal infection of left forearm, diameter approx 4 inches, ned prescribed

## 2011-03-20 NOTE — Assessment & Plan Note (Signed)
Controlled, no change in medication  

## 2011-03-20 NOTE — Progress Notes (Signed)
  Subjective:    Patient ID: Bryan Rodgers, male    DOB: [birth year], 51 y.o.   MRN: [id number]  HPI The PT is here for follow up and re-evaluation of chronic medical conditions, medication management and review of any available recent lab and radiology data.  Preventive health is updated, specifically  Cancer screening and Immunization.   Questions or concerns regarding consultations or procedures which the PT has had in the interim are  addressed. The PT denies any adverse reactions to current medications since the last visit.  C/o increased fatigue with excessive daytime sleepiness, his wife who is present reports excessive snoring and states that at times he appears to stop breathing in his sleep. He also c/o pruritic rash on left arm which he has been applying different topical products on withno success. No h/o redness , warmth or drainage from te area    Review of Systems See HPI Denies recent fever or chills. Denies sinus pressure, nasal congestion, ear pain or sore throat. Denies chest congestion, productive cough or wheezing. Denies palpitations and leg swelling, PND  Or orthopnea. C/o substernal chest discomfort moreso while lying down C/o abdominal pain , bloating and nausea.denies hematemesis, or change in bowel movements Denies joint pain, swelling and limitation in mobility. Denies headaches, seizures, numbness, or tingling. Denies depression, anxiety or insomnia. .        Objective:   Physical Exam  Patient alert and oriented and in no cardiopulmonary distress.  HEENT: No facial asymmetry, EOMI, no sinus tenderness,  oropharynx pink and moist.  Neck supple no adenopathy.Neck short and thick.  Chest: Clear to auscultation bilaterally.No localized/reproducible tenderness EKG:NSR, no ischemic changes  CVS: S1, S2 no murmurs, no S3.  ABD: Soft mild epigastric tenderness. No organomegaly or masses. Bowel sounds normal.  Ext: No edema  MS: Adequate ROM spine,  shoulders, hips and knees.  Skin: Intact, no ulcerations or rash noted.  Psych: Good eye contact, normal affect. Memory intact not anxious or depressed appearing.  CNS: CN 2-12 intact, power, tone and sensation normal throughout.       Assessment & Plan:

## 2011-03-24 ENCOUNTER — Encounter: Payer: Self-pay | Admitting: Family Medicine

## 2011-04-07 ENCOUNTER — Ambulatory Visit: Payer: 59 | Attending: Neurology | Admitting: Sleep Medicine

## 2011-04-07 DIAGNOSIS — G4700 Insomnia, unspecified: Secondary | ICD-10-CM

## 2011-04-07 DIAGNOSIS — G4733 Obstructive sleep apnea (adult) (pediatric): Secondary | ICD-10-CM | POA: Insufficient documentation

## 2011-04-07 DIAGNOSIS — Z6830 Body mass index (BMI) 30.0-30.9, adult: Secondary | ICD-10-CM | POA: Insufficient documentation

## 2011-04-13 NOTE — Procedures (Signed)
NAME:  Bryan Rodgers, Bryan Rodgers              ACCOUNT NO.:  [id number]  MEDICAL RECORD NO.:  [id number]          PATIENT TYPE:  OUT  LOCATION:  SLEEP LAB                     FACILITY:  [facility]  PHYSICIAN:  Joshual Terrio A. Gerilyn Pilgrim, M.D. DATE OF BIRTH:  1960-09-13  DATE OF STUDY:  04/07/2011                           NOCTURNAL POLYSOMNOGRAM  REFERRING PHYSICIAN:  Zonnique Norkus A. Gerilyn Pilgrim, M.D.   INDICATION:  A 51 year old who presents with obesity, witnessed apnea, and snoring.  The study is being done to evaluate for obstructive sleep apnea syndrome.  MEDICATIONS:  Prilosec, metoprolol, chlorthalidone, ibuprofen.  EPWORTH SLEEPINESS SCALE: 1. BMI 30.  ARCHITECTURAL SUMMARY:  The total recording time is 433 minutes.  Sleep efficiency is 76%.  Sleep latency 13 minutes.  REM latency 75 minutes. Stage N1 14%, N2 46%, N3 16%, and REM sleep 23%.  RESPIRATORY SUMMARY:  Baseline oxygen saturation 95, lowest saturation 83 during REM sleep.  Diagnostic AHI is 21 and RDI 23.  LIMB MOVEMENT SUMMARY:  PLM index 0.  ELECTROCARDIOGRAM SUMMARY:  Average heart rate is 85 with no significant dysrhythmias observed.  IMPRESSION:  Moderate obstructive sleep apnea syndrome.  RECOMMENDATION:  Formal titration study.    Bryan Rodgers A. Gerilyn Pilgrim, M.D.    KAD/MEDQ  D:  04/12/2011 08:28:30  T:  04/13/2011 40:98:11  Job:  [id number]

## 2011-05-24 ENCOUNTER — Other Ambulatory Visit: Payer: Self-pay | Admitting: Family Medicine

## 2011-06-18 ENCOUNTER — Other Ambulatory Visit: Payer: Self-pay | Admitting: Family Medicine

## 2011-07-07 ENCOUNTER — Ambulatory Visit: Payer: 59 | Admitting: Family Medicine

## 2011-08-11 ENCOUNTER — Encounter: Payer: Self-pay | Admitting: Family Medicine

## 2011-08-11 ENCOUNTER — Other Ambulatory Visit: Payer: Self-pay | Admitting: Family Medicine

## 2011-08-11 ENCOUNTER — Ambulatory Visit (INDEPENDENT_AMBULATORY_CARE_PROVIDER_SITE_OTHER): Payer: 59 | Admitting: Family Medicine

## 2011-08-11 VITALS — BP 139/84 | HR 80 | Resp 18 | Ht 70.0 in | Wt 204.1 lb

## 2011-08-11 DIAGNOSIS — E559 Vitamin D deficiency, unspecified: Secondary | ICD-10-CM

## 2011-08-11 DIAGNOSIS — K648 Other hemorrhoids: Secondary | ICD-10-CM

## 2011-08-11 DIAGNOSIS — F1010 Alcohol abuse, uncomplicated: Secondary | ICD-10-CM

## 2011-08-11 DIAGNOSIS — I10 Essential (primary) hypertension: Secondary | ICD-10-CM

## 2011-08-11 DIAGNOSIS — Z1211 Encounter for screening for malignant neoplasm of colon: Secondary | ICD-10-CM

## 2011-08-11 DIAGNOSIS — Z0000 Encounter for general adult medical examination without abnormal findings: Secondary | ICD-10-CM

## 2011-08-11 DIAGNOSIS — Z113 Encounter for screening for infections with a predominantly sexual mode of transmission: Secondary | ICD-10-CM

## 2011-08-11 DIAGNOSIS — R7309 Other abnormal glucose: Secondary | ICD-10-CM

## 2011-08-11 DIAGNOSIS — E119 Type 2 diabetes mellitus without complications: Secondary | ICD-10-CM | POA: Insufficient documentation

## 2011-08-11 DIAGNOSIS — R972 Elevated prostate specific antigen [PSA]: Secondary | ICD-10-CM

## 2011-08-11 DIAGNOSIS — R7303 Prediabetes: Secondary | ICD-10-CM

## 2011-08-11 MED ORDER — HYDROCORTISONE ACE-PRAMOXINE 1-1 % RE FOAM: 1.0000 | Freq: Two times a day (BID) | RECTAL | Status: DC

## 2011-08-11 MED ORDER — METOPROLOL SUCCINATE ER 25 MG PO TB24: 25.0000 mg | ORAL_TABLET | Freq: Every day | ORAL | Status: DC

## 2011-08-11 MED ORDER — ERGOCALCIFEROL 1.25 MG (50000 UT) PO CAPS: 50000.0000 [IU] | ORAL_CAPSULE | ORAL | Status: DC

## 2011-08-11 MED ORDER — METFORMIN HCL 500 MG PO TABS: 500.0000 mg | ORAL_TABLET | Freq: Every day | ORAL | Status: DC

## 2011-08-11 NOTE — Patient Instructions (Addendum)
F/u in 3.5 month  Labs today, HBA1C, HSV type 2   Labs in 3.5 months, non fasting, chem 7 and HBA1C  You need to take vit D weekly for 6 months  You need to see urologist about your prostate, the lab test is abnormal. THIS IS IMPORTANT  New medication for blood sugar, metformin, you are prediabetic.  Med sent in for  hemmorhoids  You need to stop smoking and cut back on alcohol

## 2011-08-12 LAB — HEMOGLOBIN A1C: Mean Plasma Glucose: 137 mg/dL — ABNORMAL HIGH (ref <117)

## 2011-08-14 DIAGNOSIS — Z0000 Encounter for general adult medical examination without abnormal findings: Secondary | ICD-10-CM | POA: Insufficient documentation

## 2011-08-14 NOTE — Assessment & Plan Note (Signed)
Diet discussed, pt to start metformion

## 2011-08-14 NOTE — Assessment & Plan Note (Signed)
Currently asymptomatic, but requests med for as needed use, same prescribed. Importance of soft stool with regular voiding duscussed

## 2011-08-14 NOTE — Assessment & Plan Note (Signed)
Controlled, no change in medication  

## 2011-08-14 NOTE — Assessment & Plan Note (Signed)
Cessation counseling done, reports using less than in the past

## 2011-08-14 NOTE — Assessment & Plan Note (Signed)
Weekly supplement prescribed x 6 month

## 2011-08-14 NOTE — Assessment & Plan Note (Signed)
counseled re benefit of commitment to regular exercise, and low fat  healthy diet rich in fruit and vegetable with sufficient water intake of approx 64 ounces. Weight loss stressed, also need for smoking cessation

## 2011-08-14 NOTE — Assessment & Plan Note (Signed)
Needs to be seen by urology as soon as possible

## 2011-08-14 NOTE — Progress Notes (Signed)
  Subjective:    Patient ID: Bryan Rodgers, male    DOB: 1960/07/16, 51 y.o.   MRN: [id number]  HPI The PT is here for annual exam and re-evaluation of chronic medical conditions, medication management and review of any available recent lab and radiology data.  Preventive health is updated, specifically  Cancer screening and Immunization.   Questions or concerns regarding consultations or procedures which the PT has had in the interim are  addressed. The PT denies any adverse reactions to current medications since the last visit.  Concerned since wife recently diagnosed with HSV type 2, he wants to be tested also, currently asymptomatic, though he does have a h/o genital warts. Was unawre reportedly of abnormal labs since Feb, though documentation to the contrary on record, he needs urology asap,and understands this There are no specific complaints       Review of Systems See HPI Denies recent fever or chills. Denies sinus pressure, nasal congestion, ear pain or sore throat. Denies chest congestion, productive cough or wheezing. Denies chest pains, palpitations and leg swelling Denies abdominal pain, nausea, vomiting,diarrhea or constipation.   Denies dysuria, frequency, hesitancy or incontinence. Denies joint pain, swelling and limitation in mobility. Denies headaches, seizures, numbness, or tingling. Denies depression, anxiety or insomnia. Denies skin break down or rash.        Objective:   Physical Exam Pleasant well nourished male, alert and oriented x 3, in no cardio-pulmonary distress. Afebrile. HEENT No facial trauma or asymetry. Sinuses non tender. EOMI, PERTL, fundoscopic exam is negative for hemorhages or exudates. External ears normal, tympanic membranes clear. Oropharynx moist, no exudate, fair  dentition. Neck: supple, no adenopathy,JVD or thyromegaly.No bruits.  Chest: Clear to ascultation bilaterally.No crackles or wheezes.decreased though adequate air  entry Non tender to palpation  Breast: No asymetry,no masses. No nipple discharge or inversion. No axillary or supraclavicular adenopathy  Cardiovascular system; Heart sounds normal,  S1 and  S2 ,no S3.  No murmur, or thrill. Apical beat not displaced Peripheral pulses normal.  Abdomen: Soft, non tender, no organomegaly or masses. No bruits. Bowel sounds normal. No guarding, tenderness or rebound.  Rectal:  No mass. guaiac positive stool Prostate smooth and firm, enlarged  GU: No penile lesion or discharge. No testicular mass.  Musculoskeletal exam: Full ROM of spine, hips , shoulders and knees. No deformity ,swelling or crepitus noted. No muscle wasting or atrophy.   Neurologic: Cranial nerves 2 to 12 intact. Power, tone ,sensation and reflexes normal throughout. No disturbance in gait. No tremor.  Skin: Intact, no ulceration, erythema , scaling or rash noted. Pigmentation normal throughout  Psych; Normal mood and affect. Judgement and concentration normal         Assessment & Plan:

## 2011-08-18 ENCOUNTER — Telehealth: Payer: Self-pay | Admitting: Family Medicine

## 2011-08-19 NOTE — Telephone Encounter (Signed)
Wife aware of results.

## 2011-08-19 NOTE — Telephone Encounter (Signed)
He is prediabetic, needs to work on diet change and weight loss, HSV2 test is negative

## 2011-09-02 ENCOUNTER — Telehealth: Payer: Self-pay | Admitting: Family Medicine

## 2011-09-05 NOTE — Telephone Encounter (Signed)
Called and left message for pt to call back. She was already given this information by the Dr when she was last here

## 2011-10-07 ENCOUNTER — Ambulatory Visit (INDEPENDENT_AMBULATORY_CARE_PROVIDER_SITE_OTHER): Payer: 59 | Admitting: Urology

## 2011-10-07 DIAGNOSIS — R3129 Other microscopic hematuria: Secondary | ICD-10-CM

## 2011-10-07 DIAGNOSIS — N400 Enlarged prostate without lower urinary tract symptoms: Secondary | ICD-10-CM

## 2011-10-07 DIAGNOSIS — R972 Elevated prostate specific antigen [PSA]: Secondary | ICD-10-CM

## 2011-12-07 ENCOUNTER — Encounter: Payer: Self-pay | Admitting: Family Medicine

## 2011-12-07 ENCOUNTER — Ambulatory Visit: Payer: 59 | Admitting: Family Medicine

## 2011-12-13 ENCOUNTER — Other Ambulatory Visit: Payer: Self-pay | Admitting: Family Medicine

## 2011-12-26 ENCOUNTER — Other Ambulatory Visit: Payer: Self-pay | Admitting: Family Medicine

## 2012-01-13 ENCOUNTER — Ambulatory Visit (INDEPENDENT_AMBULATORY_CARE_PROVIDER_SITE_OTHER): Payer: 59 | Admitting: Urology

## 2012-01-13 DIAGNOSIS — R3129 Other microscopic hematuria: Secondary | ICD-10-CM

## 2012-01-13 DIAGNOSIS — R972 Elevated prostate specific antigen [PSA]: Secondary | ICD-10-CM

## 2012-02-02 ENCOUNTER — Other Ambulatory Visit: Payer: Self-pay | Admitting: Family Medicine

## 2012-03-08 ENCOUNTER — Ambulatory Visit: Payer: 59 | Admitting: Family Medicine

## 2012-03-14 ENCOUNTER — Ambulatory Visit: Payer: 59 | Admitting: Family Medicine

## 2012-03-30 ENCOUNTER — Other Ambulatory Visit: Payer: Self-pay | Admitting: Family Medicine

## 2012-04-13 ENCOUNTER — Other Ambulatory Visit: Payer: Self-pay | Admitting: Family Medicine

## 2012-04-16 ENCOUNTER — Other Ambulatory Visit: Payer: Self-pay | Admitting: Family Medicine

## 2012-04-20 ENCOUNTER — Ambulatory Visit: Payer: 59 | Admitting: Urology

## 2012-05-17 ENCOUNTER — Ambulatory Visit (INDEPENDENT_AMBULATORY_CARE_PROVIDER_SITE_OTHER): Payer: 59 | Admitting: Family Medicine

## 2012-05-17 ENCOUNTER — Encounter: Payer: Self-pay | Admitting: Family Medicine

## 2012-05-17 VITALS — BP 110/74 | HR 80 | Resp 18 | Ht 70.0 in | Wt 191.0 lb

## 2012-05-17 DIAGNOSIS — N529 Male erectile dysfunction, unspecified: Secondary | ICD-10-CM

## 2012-05-17 DIAGNOSIS — F1010 Alcohol abuse, uncomplicated: Secondary | ICD-10-CM

## 2012-05-17 DIAGNOSIS — R972 Elevated prostate specific antigen [PSA]: Secondary | ICD-10-CM

## 2012-05-17 DIAGNOSIS — R7309 Other abnormal glucose: Secondary | ICD-10-CM

## 2012-05-17 DIAGNOSIS — K219 Gastro-esophageal reflux disease without esophagitis: Secondary | ICD-10-CM

## 2012-05-17 DIAGNOSIS — E559 Vitamin D deficiency, unspecified: Secondary | ICD-10-CM

## 2012-05-17 DIAGNOSIS — I10 Essential (primary) hypertension: Secondary | ICD-10-CM

## 2012-05-17 DIAGNOSIS — R7303 Prediabetes: Secondary | ICD-10-CM

## 2012-05-17 DIAGNOSIS — F17200 Nicotine dependence, unspecified, uncomplicated: Secondary | ICD-10-CM

## 2012-05-17 LAB — COMPREHENSIVE METABOLIC PANEL
ALT: 12 U/L (ref 0–53)
AST: 19 U/L (ref 0–37)
Alkaline Phosphatase: 98 U/L (ref 39–117)
Creat: 0.92 mg/dL (ref 0.50–1.35)
Total Bilirubin: 0.8 mg/dL (ref 0.3–1.2)

## 2012-05-17 LAB — LIPID PANEL
Cholesterol: 133 mg/dL (ref 0–200)
LDL Cholesterol: 71 mg/dL (ref 0–99)
Total CHOL/HDL Ratio: 3.5 Ratio
Triglycerides: 122 mg/dL (ref <150)
VLDL: 24 mg/dL (ref 0–40)

## 2012-05-17 LAB — CBC
MCH: 30 pg (ref 26.0–34.0)
MCHC: 34.2 g/dL (ref 30.0–36.0)
Platelets: 219 10*3/uL (ref 150–400)

## 2012-05-17 MED ORDER — SILDENAFIL CITRATE 100 MG PO TABS: 100.0000 mg | ORAL_TABLET | ORAL | Status: DC | PRN

## 2012-05-17 NOTE — Assessment & Plan Note (Signed)
Though reports improved erections, difficulty in maintaining erections a problem will prescribe viagra

## 2012-05-17 NOTE — Assessment & Plan Note (Signed)
Reportedly smokes 2 to 5 ciggs per day, counseled re need to quit for 5 minutes. Unwilling to set quit date , but is thinking about it. Patient counseled for approximately 5 minutes regarding the health risks of ongoing nicotine use, specifically all types of cancer, heart disease, stroke and respiratory failure. The options available for help with cessation ,the behavioral changes to assist the process, and the option to either gradully reduce usage  Or abruptly stop.is also discussed. Pt is also encouraged to set specific goals in number of cigarettes used daily, as well as to set a quit date. '

## 2012-05-17 NOTE — Assessment & Plan Note (Signed)
Has been non compliant with metformin Updated lab today Patient educated about the importance of limiting  Carbohydrate intake , the need to commit to daily physical activity for a minimum of 30 minutes , and to commit weight loss. The fact that changes in all these areas will reduce or eliminate all together the development of diabetes is stressed.

## 2012-05-17 NOTE — Assessment & Plan Note (Signed)
Controlled, no change in medication  

## 2012-05-17 NOTE — Progress Notes (Signed)
  Subjective:    Patient ID: Bryan Rodgers, male    DOB: Feb 28, 1960, 52 y.o.   MRN: [id number]  HPI The PT is here for follow up and re-evaluation of chronic medical conditions, medication management and review of any available recent lab and radiology data.  Preventive health is updated, specifically  Cancer screening and Immunization.   States in January he decided that alcohol was killing him and he quit. States that like many others in his family, he is an alcoholic, but had been in denial for years. Feels better than he has for a long time, and attends AA meetings The PT denies any adverse reactions to current medications since the last visit. Stopped metformin long ago C/o poor erections, unable to maintain, viagra has helped and he wants this     Review of Systems See HPI Denies recent fever or chills. Denies sinus pressure, nasal congestion, ear pain or sore throat. Denies chest congestion, productive cough or wheezing. Denies chest pains, palpitations and leg swelling Denies abdominal pain, nausea, vomiting,diarrhea or constipation.   Denies dysuria, frequency, hesitancy or incontinence. Denies joint pain, swelling and limitation in mobility. Denies headaches, seizures, numbness, or tingling. Denies depression, anxiety or insomnia. Denies skin break down or rash.        Objective:   Physical Exam Patient alert and oriented and in no cardiopulmonary distress.  HEENT: No facial asymmetry, EOMI, no sinus tenderness,  oropharynx pink and moist.  Neck supple no adenopathy.  Chest: Clear to auscultation bilaterally.decreased though adequate air entry  CVS: S1, S2 no murmurs, no S3.  ABD: Soft non tender. Bowel sounds normal.  Ext: No edema  MS: Adequate ROM spine, shoulders, hips and knees.  Skin: Intact, no ulcerations or rash noted.  Psych: Good eye contact, normal affect. Memory intact not anxious or depressed appearing.  CNS: CN 2-12 intact, power, tone and  sensation normal throughout.        Assessment & Plan:

## 2012-05-17 NOTE — Assessment & Plan Note (Signed)
In recovery

## 2012-05-17 NOTE — Assessment & Plan Note (Signed)
Controlled, no change in medication DASH diet and commitment to daily physical activity for a minimum of 30 minutes discussed and encouraged, as a part of hypertension management. The importance of attaining a healthy weight is also discussed.  

## 2012-05-17 NOTE — Patient Instructions (Addendum)
F/u in 6 month, call if you need me before please.  Congratulations on stopping alcohol, you have saved your life!  New script for viagra ,a s we discussed.  Set a quit date and stop smoking, this is very important to reduce your risk of heart disease, stroke , and all forms of cancer.   Labs today, HBA1C, lipid, cmp and CBC and Vit D

## 2012-05-17 NOTE — Assessment & Plan Note (Signed)
Being followed by urology.  

## 2012-05-18 LAB — VITAMIN D 25 HYDROXY (VIT D DEFICIENCY, FRACTURES): Vit D, 25-Hydroxy: 39 ng/mL (ref 30–89)

## 2012-05-23 ENCOUNTER — Other Ambulatory Visit: Payer: Self-pay

## 2012-05-23 MED ORDER — CHLORTHALIDONE 25 MG PO TABS: ORAL_TABLET | ORAL | Status: DC

## 2012-05-23 MED ORDER — VITAMIN D (ERGOCALCIFEROL) 1.25 MG (50000 UNIT) PO CAPS: ORAL_CAPSULE | ORAL | Status: DC

## 2012-06-14 ENCOUNTER — Other Ambulatory Visit: Payer: Self-pay | Admitting: Family Medicine

## 2012-06-22 ENCOUNTER — Other Ambulatory Visit: Payer: Self-pay

## 2012-06-22 MED ORDER — METOPROLOL SUCCINATE ER 25 MG PO TB24: ORAL_TABLET | ORAL | Status: DC

## 2012-06-22 MED ORDER — CHLORTHALIDONE 25 MG PO TABS: ORAL_TABLET | ORAL | Status: DC

## 2012-06-22 MED ORDER — VITAMIN D (ERGOCALCIFEROL) 1.25 MG (50000 UNIT) PO CAPS: ORAL_CAPSULE | ORAL | Status: DC

## 2012-07-26 ENCOUNTER — Other Ambulatory Visit: Payer: Self-pay

## 2012-07-26 MED ORDER — OMEPRAZOLE 40 MG PO CPDR: DELAYED_RELEASE_CAPSULE | ORAL | Status: DC

## 2012-10-31 DIAGNOSIS — R7303 Prediabetes: Secondary | ICD-10-CM

## 2012-10-31 HISTORY — DX: Prediabetes: R73.03

## 2012-11-01 ENCOUNTER — Other Ambulatory Visit: Payer: Self-pay

## 2012-11-01 MED ORDER — METOPROLOL SUCCINATE ER 25 MG PO TB24: ORAL_TABLET | ORAL | Status: DC

## 2012-11-01 MED ORDER — CHLORTHALIDONE 25 MG PO TABS: ORAL_TABLET | ORAL | Status: DC

## 2012-11-15 ENCOUNTER — Encounter (INDEPENDENT_AMBULATORY_CARE_PROVIDER_SITE_OTHER): Payer: Self-pay

## 2012-11-15 ENCOUNTER — Encounter: Payer: Self-pay | Admitting: Family Medicine

## 2012-11-15 ENCOUNTER — Ambulatory Visit (INDEPENDENT_AMBULATORY_CARE_PROVIDER_SITE_OTHER): Payer: 59 | Admitting: Family Medicine

## 2012-11-15 VITALS — BP 114/66 | HR 70 | Resp 18 | Ht 70.0 in | Wt 199.0 lb

## 2012-11-15 DIAGNOSIS — E559 Vitamin D deficiency, unspecified: Secondary | ICD-10-CM

## 2012-11-15 DIAGNOSIS — I10 Essential (primary) hypertension: Secondary | ICD-10-CM

## 2012-11-15 DIAGNOSIS — N529 Male erectile dysfunction, unspecified: Secondary | ICD-10-CM

## 2012-11-15 DIAGNOSIS — R7309 Other abnormal glucose: Secondary | ICD-10-CM

## 2012-11-15 DIAGNOSIS — R7303 Prediabetes: Secondary | ICD-10-CM

## 2012-11-15 DIAGNOSIS — L723 Sebaceous cyst: Secondary | ICD-10-CM

## 2012-11-15 DIAGNOSIS — K219 Gastro-esophageal reflux disease without esophagitis: Secondary | ICD-10-CM

## 2012-11-15 DIAGNOSIS — R972 Elevated prostate specific antigen [PSA]: Secondary | ICD-10-CM

## 2012-11-15 DIAGNOSIS — Z1211 Encounter for screening for malignant neoplasm of colon: Secondary | ICD-10-CM

## 2012-11-15 LAB — POC HEMOCCULT BLD/STL (OFFICE/1-CARD/DIAGNOSTIC): Fecal Occult Blood, POC: NEGATIVE

## 2012-11-15 MED ORDER — TADALAFIL 20 MG PO TABS: ORAL_TABLET | ORAL | Status: DC

## 2012-11-15 NOTE — Patient Instructions (Addendum)
F/u in 6 month, call if you need me before.  Rectal today  Pls schedule f/u with urology after you leave here.  Labs today HBA1C, vit D ,  and chem 7  Keep doing the good things you are doing  You need to start aspirin 81mg  one daily for reduction of heart disease risk  Call back and let us know which medication is covered for erectile dysfunction for future refills please

## 2012-11-16 LAB — HEMOGLOBIN A1C
Hgb A1c MFr Bld: 6.4 % — ABNORMAL HIGH (ref <5.7)
Mean Plasma Glucose: 137 mg/dL — ABNORMAL HIGH (ref <117)

## 2012-11-16 LAB — VITAMIN D 25 HYDROXY (VIT D DEFICIENCY, FRACTURES): Vit D, 25-Hydroxy: 50 ng/mL (ref 30–89)

## 2012-11-16 LAB — BASIC METABOLIC PANEL
CO2: 28 mEq/L (ref 19–32)
Chloride: 104 mEq/L (ref 96–112)
Glucose, Bld: 87 mg/dL (ref 70–99)
Potassium: 4.2 mEq/L (ref 3.5–5.3)
Sodium: 138 mEq/L (ref 135–145)

## 2012-11-17 ENCOUNTER — Encounter: Payer: Self-pay | Admitting: Family Medicine

## 2012-11-17 DIAGNOSIS — L723 Sebaceous cyst: Secondary | ICD-10-CM | POA: Insufficient documentation

## 2012-11-17 NOTE — Assessment & Plan Note (Signed)
Needs to commit to weekly replacement 

## 2012-11-17 NOTE — Addendum Note (Signed)
Addended by: Kandis Fantasia B on: 11/17/2012 10:17 PM   Modules accepted: Orders

## 2012-11-17 NOTE — Assessment & Plan Note (Signed)
Right anterior chest  sebaceous cyst, not infected. Successful extrusion of a significant amount of sebaceous material by pressure applied on either side of cyst. No complications , cleaned  With saline and antibiotic ointment applied around area

## 2012-11-17 NOTE — Assessment & Plan Note (Signed)
Followed by urology, needs appt past due

## 2012-11-17 NOTE — Assessment & Plan Note (Signed)
Improved significantly since no longer drinks

## 2012-11-17 NOTE — Assessment & Plan Note (Signed)
Script and coupon for cialis provided, pt to call back re preferred drug and coverage

## 2012-11-17 NOTE — Assessment & Plan Note (Signed)
DASH diet and commitment to daily physical activity for a minimum of 30 minutes discussed and encouraged, as a part of hypertension management. The importance of attaining a healthy weight is also discussed.  

## 2012-11-17 NOTE — Progress Notes (Signed)
  Subjective:    Patient ID: Bryan Rodgers, male    DOB: 1960/07/28, 52 y.o.   MRN: [id number]  HPI The PT is here for follow up and re-evaluation of chronic medical conditions, medication management and review of any available recent lab and radiology data.  Preventive health is updated, specifically  Cancer screening and Immunization.   Questions or concerns regarding consultations or procedures which the PT has had in the interim are  Addressed.Needs to f/u with urology for elevated PSA The PT denies any adverse reactions to current medications since the last visit.  There are no new concerns.Cost of ED drug is prohibitive though he benefits , will try to cost calis  There are no specific complaints, except enlarging cyst on right anterior chest Feels much better since no longer smokes or drinks alcohol       Review of Systems See HPI Denies recent fever or chills. Denies sinus pressure, nasal congestion, ear pain or sore throat. Denies chest congestion, productive cough or wheezing. Denies chest pains, palpitations and leg swelling Denies abdominal pain, nausea, vomiting,diarrhea or constipation.   Denies dysuria, frequency, hesitancy or incontinence. Denies joint pain, swelling and limitation in mobility. Denies headaches, seizures, numbness, or tingling. Denies depression, anxiety or insomnia.         Objective:   Physical Exam Patient alert and oriented and in no cardiopulmonary distress.  HEENT: No facial asymmetry, EOMI, no sinus tenderness,  oropharynx pink and moist.  Neck supple no adenopathy.  Chest: Clear to auscultation bilaterally.  CVS: S1, S2 no murmurs, no S3.  ABD: Soft non tender. Bowel sounds normal. Rectal: no mass, heme negative stool Ext: No edema  MS: Adequate ROM spine, shoulders, hips and knees.  Skin: Intact, no ulcerations or rash noted.Deep sebaceous cyst anterior right chest. Direct pressure to area, resulted in extrusion of  significant amount of sebaceous material. Area then cleaned and small amt of antibiotic ointment applied  Psych: Good eye contact, normal affect. Memory intact not anxious or depressed appearing.  CNS: CN 2-12 intact, power, tone and sensation normal throughout.        Assessment & Plan:

## 2012-11-17 NOTE — Assessment & Plan Note (Signed)
Deteriorated, needs to reduce sugar intake , limit carbs , and commit to daily exercise and weight loss

## 2012-11-21 NOTE — Addendum Note (Signed)
Addended by: Abner Greenspan on: 11/21/2012 02:42 PM   Modules accepted: Orders

## 2012-12-03 ENCOUNTER — Telehealth: Payer: Self-pay | Admitting: Family Medicine

## 2012-12-03 NOTE — Telephone Encounter (Signed)
Called patient and left message for them to return call at the office   

## 2012-12-04 NOTE — Telephone Encounter (Signed)
Wanted to know what kind of labs he had in the past and when they were done

## 2012-12-31 ENCOUNTER — Other Ambulatory Visit: Payer: Self-pay

## 2012-12-31 MED ORDER — METOPROLOL SUCCINATE ER 25 MG PO TB24: ORAL_TABLET | ORAL | Status: DC

## 2012-12-31 MED ORDER — CHLORTHALIDONE 25 MG PO TABS: ORAL_TABLET | ORAL | Status: DC

## 2013-01-03 ENCOUNTER — Ambulatory Visit: Payer: 59 | Admitting: Family Medicine

## 2013-01-14 ENCOUNTER — Encounter: Payer: Self-pay | Admitting: Family Medicine

## 2013-01-14 ENCOUNTER — Ambulatory Visit (HOSPITAL_COMMUNITY)
Admission: RE | Admit: 2013-01-14 | Discharge: 2013-01-14 | Disposition: A | Discharge status: Home / Self Care | Payer: 59 | Source: Ambulatory Visit | Attending: Family Medicine | Admitting: Family Medicine

## 2013-01-14 ENCOUNTER — Ambulatory Visit (INDEPENDENT_AMBULATORY_CARE_PROVIDER_SITE_OTHER): Payer: 59 | Admitting: Family Medicine

## 2013-01-14 ENCOUNTER — Encounter (INDEPENDENT_AMBULATORY_CARE_PROVIDER_SITE_OTHER): Payer: Self-pay

## 2013-01-14 VITALS — BP 118/78 | HR 82 | Resp 16 | Ht 70.0 in | Wt 207.4 lb

## 2013-01-14 DIAGNOSIS — M25519 Pain in unspecified shoulder: Secondary | ICD-10-CM

## 2013-01-14 DIAGNOSIS — M545 Low back pain, unspecified: Secondary | ICD-10-CM | POA: Insufficient documentation

## 2013-01-14 DIAGNOSIS — R7309 Other abnormal glucose: Secondary | ICD-10-CM

## 2013-01-14 DIAGNOSIS — M549 Dorsalgia, unspecified: Secondary | ICD-10-CM

## 2013-01-14 DIAGNOSIS — R7301 Impaired fasting glucose: Secondary | ICD-10-CM

## 2013-01-14 DIAGNOSIS — M25511 Pain in right shoulder: Secondary | ICD-10-CM

## 2013-01-14 DIAGNOSIS — R7303 Prediabetes: Secondary | ICD-10-CM

## 2013-01-14 DIAGNOSIS — I10 Essential (primary) hypertension: Secondary | ICD-10-CM

## 2013-01-14 MED ORDER — KETOROLAC TROMETHAMINE 60 MG/2ML IM SOLN
60.0000 mg | Freq: Once | INTRAMUSCULAR | Status: AC
  Administered 2013-01-14: 60 mg via INTRAMUSCULAR

## 2013-01-14 MED ORDER — METHYLPREDNISOLONE ACETATE 80 MG/ML IJ SUSP
80.0000 mg | Freq: Once | INTRAMUSCULAR | Status: AC
  Administered 2013-01-14: 80 mg via INTRAMUSCULAR

## 2013-01-14 MED ORDER — METFORMIN HCL 500 MG PO TABS: 500.0000 mg | ORAL_TABLET | Freq: Every day | ORAL | Status: DC

## 2013-01-14 MED ORDER — PREDNISONE (PAK) 5 MG PO TABS: 5.0000 mg | ORAL_TABLET | ORAL | Status: DC

## 2013-01-14 NOTE — Progress Notes (Signed)
   Subjective:    Patient ID: Bryan Rodgers, male    DOB: 05-Jun-1960, 52 y.o.   MRN: [id number]  HPI P tin with primary c/o bilateral shoulder pain and groin pain, in the past 4 to 6 week noted to be increased. He denies limitation in shoulder motion. He is very concerned because he reports that5 first degree family members dies of bone cancer. When I specifically asked he was uncertain if the primary cancer was indeed bone , ot whether the cancer had spread to the bone, the more likely scenario. Has continued to gain weight,no commitment to regular exercise and eating more now that he no longer smokes. Denies depression or anxiety  Review of Systems See HPI Denies recent fever or chills. Denies sinus pressure, nasal congestion, ear pain or sore throat. Denies chest congestion, productive cough or wheezing. Denies chest pains, palpitations and leg swelling Denies abdominal pain, nausea, vomiting,diarrhea or constipation.   Denies dysuria, frequency, hesitancy or incontinence. Denies headaches, seizures, numbness, or tingling. Denies depression, anxiety or insomnia. Denies skin break down or rash.        Objective:   Physical Exam  Patient alert and oriented and in no cardiopulmonary distress.  HEENT: No facial asymmetry, EOMI, no sinus tenderness,  oropharynx pink and moist.  Neck supple no adenopathy.  Chest: Clear to auscultation bilaterally.  CVS: S1, S2 no murmurs, no S3.  ABD: Soft non tender. Bowel sounds normal.No inguinal adenopathy  Ext: No edema  MS: Adequate ROM spine, shoulders, hips and knees.  Skin: Intact, no ulcerations or rash noted.  Psych: Good eye contact, normal affect. Memory intact not anxious or depressed appearing.  CNS: CN 2-12 intact, power, tone and sensation normal throughout.       Assessment & Plan:

## 2013-01-14 NOTE — Patient Instructions (Signed)
F/u in 3 month, call if you need me before, certainly if symptoms persist or worsen after 3 weeks  I believe that the pain in your shoulders is due to arthritis, and the groin and thigh pain is from "pinched nerve" in your lower back  X rays are ordered of both shoulders and your low back, please get them today  Toradol 60mg  and depo medrol 80mg  im administered, and sent to your local pharmacy are ibuprofen and prednisone, please take as directed. Take your omeprazole every day so you do not get stomach irritation  Your blood sugar has increased even more, start metformin one daily and change eating and focus on exercise and weight loss. Metformin is sent to mail order  HBA1C, chem 7 and EGFR in 3 month, before follow up

## 2013-01-19 NOTE — Assessment & Plan Note (Signed)
Dg lumbar spine and high dose of anti inflammatories

## 2013-01-19 NOTE — Assessment & Plan Note (Signed)
Deteriorated Patient educated about the importance of limiting  Carbohydrate intake , the need to commit to daily physical activity for a minimum of 30 minutes , and to commit weight loss. The fact that changes in all these areas will reduce or eliminate all together the development of diabetes is stressed.    

## 2013-01-19 NOTE — Assessment & Plan Note (Signed)
Controlled, no change in medication DASH diet and commitment to daily physical activity for a minimum of 30 minutes discussed and encouraged, as a part of hypertension management. The importance of attaining a healthy weight is also discussed.  

## 2013-01-19 NOTE — Assessment & Plan Note (Signed)
Dg both shoulders, pt has full ROM both shoulders, pain likely from c spine disease, high dose anti inflammatories

## 2013-01-22 ENCOUNTER — Telehealth: Payer: Self-pay

## 2013-01-22 DIAGNOSIS — R7303 Prediabetes: Secondary | ICD-10-CM

## 2013-01-27 NOTE — Telephone Encounter (Signed)
Noted , he needs an end January rept HBA1C and chem 7 non fasting, pls explain why this is impt , as far as a dx of diabetes is concerned, please order also he NEEDS this done ( current ov is later than this may need to be seen sooner based on lab explain this to him pls) Explain how vital it is that he work on lifestyle change and weight loss since reports intolerance of the metformin. I believe if he made a committed attempt to reduce cars and sweets intolerance would be less , encourage him to attempt this if he has not already done so pls

## 2013-01-29 ENCOUNTER — Telehealth: Payer: Self-pay | Admitting: Family Medicine

## 2013-01-30 ENCOUNTER — Telehealth: Payer: Self-pay

## 2013-01-30 ENCOUNTER — Other Ambulatory Visit: Payer: Self-pay

## 2013-01-30 MED ORDER — PROMETHAZINE-DM 6.25-15 MG/5ML PO SYRP
ORAL_SOLUTION | ORAL | Status: DC
Start: 2013-01-30 — End: 2013-01-30

## 2013-01-30 MED ORDER — PROMETHAZINE-DM 6.25-15 MG/5ML PO SYRP: ORAL_SOLUTION | ORAL | Status: AC

## 2013-01-30 NOTE — Addendum Note (Signed)
Addended by: Kandis Fantasia B on: 01/30/2013 03:12 PM   Modules accepted: Orders

## 2013-01-30 NOTE — Telephone Encounter (Signed)
Patient aware and will have labs drawn in January.  Lab order mailed to patient as requested.

## 2013-01-30 NOTE — Telephone Encounter (Signed)
Phenergan dm sent let him know pls

## 2013-01-30 NOTE — Telephone Encounter (Signed)
Called and left message that med was sent

## 2013-01-30 NOTE — Telephone Encounter (Signed)
See precious message where this matter was addressed

## 2013-01-30 NOTE — Telephone Encounter (Signed)
Wife called for patient stating that he had URI symptoms over the weekend and was out of work for Monday and Tuesday.  He is feeling better except for a lingering cough that worsens at night.  He went back to work today but is asking for something for cough.  Please advise.  Pharmacy: [facility]

## 2013-02-08 ENCOUNTER — Telehealth: Payer: Self-pay | Admitting: Family Medicine

## 2013-02-08 MED ORDER — TRAMADOL HCL 50 MG PO TABS: 50.0000 mg | ORAL_TABLET | Freq: Four times a day (QID) | ORAL | Status: AC | PRN

## 2013-02-08 NOTE — Telephone Encounter (Signed)
Tramadol printed pls send and let him know

## 2013-02-08 NOTE — Telephone Encounter (Signed)
Patient aware and med sent  

## 2013-02-08 NOTE — Telephone Encounter (Signed)
Noted that both are in his history from December.  Please advise

## 2013-05-01 ENCOUNTER — Other Ambulatory Visit: Payer: Self-pay

## 2013-05-01 MED ORDER — OMEPRAZOLE 40 MG PO CPDR: DELAYED_RELEASE_CAPSULE | ORAL | Status: DC

## 2013-05-16 ENCOUNTER — Ambulatory Visit: Payer: 59 | Admitting: Family Medicine

## 2013-05-29 ENCOUNTER — Other Ambulatory Visit: Payer: Self-pay

## 2013-05-29 MED ORDER — CHLORTHALIDONE 25 MG PO TABS: ORAL_TABLET | ORAL | Status: DC

## 2013-05-29 MED ORDER — METOPROLOL SUCCINATE ER 25 MG PO TB24: ORAL_TABLET | ORAL | Status: DC

## 2013-06-14 ENCOUNTER — Telehealth: Payer: Self-pay | Admitting: Family Medicine

## 2013-06-14 DIAGNOSIS — E049 Nontoxic goiter, unspecified: Secondary | ICD-10-CM

## 2013-06-14 DIAGNOSIS — R7303 Prediabetes: Secondary | ICD-10-CM

## 2013-06-14 DIAGNOSIS — I10 Essential (primary) hypertension: Secondary | ICD-10-CM

## 2013-06-14 NOTE — Telephone Encounter (Signed)
Pt should have had HBa1C in January he did not, last was 6.4. Please mail a letter/contact him for all labs fasting except PSa (he sees urology) Pls order CBc, lipid, cmp and HBa1C and TSH needs asap, thanks

## 2013-06-14 NOTE — Telephone Encounter (Signed)
Letter sent with lab requisition and appointment reminder

## 2013-06-14 NOTE — Addendum Note (Signed)
Addended by: Denman George B on: 06/14/2013 10:42 AM   Modules accepted: Orders

## 2013-07-16 ENCOUNTER — Encounter: Payer: Self-pay | Admitting: Family Medicine

## 2013-07-16 ENCOUNTER — Encounter (INDEPENDENT_AMBULATORY_CARE_PROVIDER_SITE_OTHER): Payer: Self-pay

## 2013-07-16 ENCOUNTER — Ambulatory Visit (INDEPENDENT_AMBULATORY_CARE_PROVIDER_SITE_OTHER): Payer: 59 | Admitting: Family Medicine

## 2013-07-16 VITALS — BP 112/78 | HR 67 | Resp 16 | Ht 70.0 in | Wt 197.1 lb

## 2013-07-16 DIAGNOSIS — M5440 Lumbago with sciatica, unspecified side: Secondary | ICD-10-CM

## 2013-07-16 DIAGNOSIS — M5430 Sciatica, unspecified side: Secondary | ICD-10-CM

## 2013-07-16 DIAGNOSIS — K648 Other hemorrhoids: Secondary | ICD-10-CM

## 2013-07-16 DIAGNOSIS — Z79899 Other long term (current) drug therapy: Secondary | ICD-10-CM

## 2013-07-16 DIAGNOSIS — Z0000 Encounter for general adult medical examination without abnormal findings: Secondary | ICD-10-CM

## 2013-07-16 DIAGNOSIS — M549 Dorsalgia, unspecified: Secondary | ICD-10-CM

## 2013-07-16 DIAGNOSIS — R7303 Prediabetes: Secondary | ICD-10-CM

## 2013-07-16 DIAGNOSIS — R195 Other fecal abnormalities: Secondary | ICD-10-CM

## 2013-07-16 DIAGNOSIS — E559 Vitamin D deficiency, unspecified: Secondary | ICD-10-CM

## 2013-07-16 DIAGNOSIS — Z1211 Encounter for screening for malignant neoplasm of colon: Secondary | ICD-10-CM

## 2013-07-16 DIAGNOSIS — D126 Benign neoplasm of colon, unspecified: Secondary | ICD-10-CM

## 2013-07-16 LAB — CBC WITH DIFFERENTIAL/PLATELET
BASOS PCT: 1 % (ref 0–1)
Basophils Absolute: 0.1 10*3/uL (ref 0.0–0.1)
EOS ABS: 0.3 10*3/uL (ref 0.0–0.7)
Eosinophils Relative: 5 % (ref 0–5)
HCT: 45.7 % (ref 39.0–52.0)
HEMOGLOBIN: 15.1 g/dL (ref 13.0–17.0)
Lymphocytes Relative: 50 % — ABNORMAL HIGH (ref 12–46)
Lymphs Abs: 3.5 10*3/uL (ref 0.7–4.0)
MCH: 28.8 pg (ref 26.0–34.0)
MCHC: 33 g/dL (ref 30.0–36.0)
MCV: 87 fL (ref 78.0–100.0)
Monocytes Absolute: 0.4 10*3/uL (ref 0.1–1.0)
Monocytes Relative: 6 % (ref 3–12)
NEUTROS PCT: 38 % — AB (ref 43–77)
Neutro Abs: 2.6 10*3/uL (ref 1.7–7.7)
Platelets: 226 10*3/uL (ref 150–400)
RBC: 5.25 MIL/uL (ref 4.22–5.81)
RDW: 14.3 % (ref 11.5–15.5)
WBC: 6.9 10*3/uL (ref 4.0–10.5)

## 2013-07-16 LAB — LIPID PANEL
Cholesterol: 127 mg/dL (ref 0–200)
HDL: 31 mg/dL — ABNORMAL LOW (ref >39)
LDL Cholesterol: 67 mg/dL (ref 0–99)
TRIGLYCERIDES: 144 mg/dL (ref <150)
Total CHOL/HDL Ratio: 4.1 Ratio
VLDL: 29 mg/dL (ref 0–40)

## 2013-07-16 LAB — COMPLETE METABOLIC PANEL WITH GFR
ALBUMIN: 4.6 g/dL (ref 3.5–5.2)
ALT: 19 U/L (ref 0–53)
AST: 22 U/L (ref 0–37)
Alkaline Phosphatase: 115 U/L (ref 39–117)
BUN: 19 mg/dL (ref 6–23)
CALCIUM: 10 mg/dL (ref 8.4–10.5)
CHLORIDE: 101 meq/L (ref 96–112)
CO2: 25 mEq/L (ref 19–32)
Creat: 0.96 mg/dL (ref 0.50–1.35)
GFR, Est Non African American: 89 mL/min
Glucose, Bld: 93 mg/dL (ref 70–99)
POTASSIUM: 4.3 meq/L (ref 3.5–5.3)
Sodium: 136 mEq/L (ref 135–145)
Total Bilirubin: 0.5 mg/dL (ref 0.2–1.2)
Total Protein: 7.4 g/dL (ref 6.0–8.3)

## 2013-07-16 LAB — HEMOGLOBIN A1C
HEMOGLOBIN A1C: 6.4 % — AB (ref <5.7)
MEAN PLASMA GLUCOSE: 137 mg/dL — AB (ref <117)

## 2013-07-16 LAB — POC HEMOCCULT BLD/STL (OFFICE/1-CARD/DIAGNOSTIC): FECAL OCCULT BLD: POSITIVE

## 2013-07-16 MED ORDER — KETOROLAC TROMETHAMINE 60 MG/2ML IM SOLN
60.0000 mg | Freq: Once | INTRAMUSCULAR | Status: AC
  Administered 2013-07-16: 60 mg via INTRAMUSCULAR

## 2013-07-16 MED ORDER — METHYLPREDNISOLONE ACETATE 80 MG/ML IJ SUSP
80.0000 mg | Freq: Once | INTRAMUSCULAR | Status: AC
  Administered 2013-07-16: 80 mg via INTRAMUSCULAR

## 2013-07-16 MED ORDER — HYDROCORTISONE ACETATE 30 MG RE SUPP: 1.0000 | Freq: Two times a day (BID) | RECTAL | Status: DC

## 2013-07-16 NOTE — Patient Instructions (Addendum)
F/u in 4 month, call if you need me before  Toradol and depo medrol in office today for back pain radiating to hips   Topical preparation to help shrink hemorhoids which should reduce rectal bleeding , will get back to you re neeed to see GI Doc  Labs today CBC, lipid, cmp and EGFR, hBA1C , vit D  Continue f/u with urology  Excellent blood pressure, congrats on weight loss, keep healthy lifestyle change up!

## 2013-07-17 LAB — VITAMIN D 25 HYDROXY (VIT D DEFICIENCY, FRACTURES): Vit D, 25-Hydroxy: 39 ng/mL (ref 30–89)

## 2013-08-09 ENCOUNTER — Other Ambulatory Visit: Payer: Self-pay

## 2013-08-09 MED ORDER — CHLORTHALIDONE 25 MG PO TABS: ORAL_TABLET | ORAL | Status: DC

## 2013-08-10 ENCOUNTER — Other Ambulatory Visit: Payer: Self-pay | Admitting: Family Medicine

## 2013-08-15 ENCOUNTER — Other Ambulatory Visit: Payer: Self-pay | Admitting: Family Medicine

## 2013-08-25 DIAGNOSIS — Z0000 Encounter for general adult medical examination without abnormal findings: Secondary | ICD-10-CM | POA: Insufficient documentation

## 2013-08-25 NOTE — Assessment & Plan Note (Signed)
Annual physical exam as documented. Counseling done  re healthy lifestyle involving commitment to 150 minutes exercise per week, heart healthy diet, and attaining healthy weight.The importance of adequate sleep also discussed. Regular seat belt use and safe storage  of firearms if patient has them, is also discussed. Changes in health habits are decided on by the patient with goals and time frames  set for achieving them. Immunization and cancer screening needs are specifically addressed at this visit.

## 2013-08-25 NOTE — Progress Notes (Signed)
   Subjective:    Patient ID: Bryan Rodgers, male    DOB: 08/10/1960, 53 y.o.   MRN: [id number]  HPI Patient is in for annual physical exam. 5 day h/o increased back pain radiaiting to both hips, denies lower extremity weakness  Or numbness, fels hat excessive physical activity triggered this episode , which he has had in the past. Recent episode of BRRB after straining at stool, has hemorrhoids , and these are currently swollen and painful, has used prep H for the past 3 days with some relief  Review of Systems See HPI    Objective:   Physical ExamPleasant well nourished male, alert and oriented x 3, in no cardio-pulmonary distress. Afebrile. /BP 112/78  Pulse 67  Resp 16  Ht 5\' 10"  (1.778 m)  Wt 197 lb 1.9 oz (89.413 kg)  BMI 28.28 kg/m2  SpO2 99%  HEENT No facial trauma or asymetry. Sinuses non tender. EOMI, PERTL, fundoscopic exam is negative for hemorhages or exudates. External ears normal, tympanic membranes clear. Oropharynx moist, no exudate,fairly  good dentition. Neck: supple, no adenopathy,JVD or thyromegaly.No bruits.  Chest: Clear to ascultation bilaterally.No crackles or wheezes. Non tender to palpation  Breast: No asymetry,no masses. No nipple discharge or inversion. No axillary or supraclavicular adenopathy  Cardiovascular system; Heart sounds normal,  S1 and  S2 ,no S3.  No murmur, or thrill. Apical beat not displaced Peripheral pulses normal.  Abdomen: Soft, non tender, no organomegaly or masses. No bruits. Bowel sounds normal. No guarding, tenderness or rebound.  Rectal:  Normal sphincter tone.  hemorrhoids palpated  Which are tender. guaiac positive stool. Prostate smooth and firm  GU: No penile lesion or discharge. No testicular mass or tenderness.  Musculoskeletal exam: Decreased though adequate  ROM of lumbar  Spine, normal ROM in hips , shoulders and knees. No deformity ,swelling or crepitus noted. No muscle wasting or atrophy.    Neurologic: Cranial nerves 2 to 12 intact. Power, tone ,sensation and reflexes normal throughout. No disturbance in gait. No tremor.  Skin: Intact, no ulceration, erythema , scaling or rash noted. Pigmentation normal throughout  Psych; Normal mood and affect. Judgement and concentration normal         Assessment & Plan:  Encounter for annual physical exam Annual physical exam as documented. Counseling done  re healthy lifestyle involving commitment to 150 minutes exercise per week, heart healthy diet, and attaining healthy weight.The importance of adequate sleep also discussed. Regular seat belt use and safe storage  of firearms if patient has them, is also discussed. Changes in health habits are decided on by the patient with goals and time frames  set for achieving them. Immunization and cancer screening needs are specifically addressed at this visit.   Back pain with radiation toradol and depo medrol administered in office on the day of the visit  [place] Heme positive stool with swollen  Hemmorrhoids, will refer to GI for re evaluation as far as the need for a colonoscopy at this time  INTERNAL HEMORRHOIDS Currently experiencing acute flare, some response to topical steroid, pt to continue same, andre educated pt about the importance of keeping stool as regular and soft as possible so that he does  Not strain at stool   '

## 2013-08-25 NOTE — Assessment & Plan Note (Signed)
Heme positive stool with swollen  Hemmorrhoids, will refer to GI for re evaluation as far as the need for a colonoscopy at this time

## 2013-08-25 NOTE — Assessment & Plan Note (Signed)
toradol and depo medrol administered in office on the day of the visit

## 2013-08-25 NOTE — Assessment & Plan Note (Signed)
Currently experiencing acute flare, some response to topical steroid, pt to continue same, andre educated pt about the importance of keeping stool as regular and soft as possible so that he does  Not strain at stool

## 2013-08-29 ENCOUNTER — Telehealth: Payer: Self-pay

## 2013-08-29 MED ORDER — TRAMADOL HCL 50 MG PO TABS: 50.0000 mg | ORAL_TABLET | Freq: Every evening | ORAL | Status: DC | PRN

## 2013-08-29 NOTE — Telephone Encounter (Signed)
Patient has been having lower back pain. Per Dr Moshe Cipro, will prescribe Tramadol 50mg  1 qhs/prn #30 refill3

## 2013-09-02 ENCOUNTER — Encounter: Payer: Self-pay | Admitting: Gastroenterology

## 2013-10-02 ENCOUNTER — Other Ambulatory Visit: Payer: Self-pay | Admitting: Family Medicine

## 2013-10-10 ENCOUNTER — Encounter: Payer: Self-pay | Admitting: Gastroenterology

## 2013-10-10 ENCOUNTER — Encounter: Payer: 59 | Admitting: Gastroenterology

## 2013-10-10 NOTE — Progress Notes (Signed)
   Subjective:    Patient ID: Bryan Rodgers, male    DOB: August 13, 1960, 53 y.o.   MRN: [id number]  HPI   Past Medical History  Diagnosis Date  . Chest pain     Palpations  . Elevated CPK   . Tobacco abuse   . Overweight (BMI 25.0-29.9)   . Hemorrhoids     with bleeding   . History of colonic polyps     removed via colonscope  . Hypertension   . Prediabetes 10/2012    HBA1C is 6.4  . Alcoholic quit 3568    61-68 beers per day on the weekends     Review of Systems     Objective:   Physical Exam        Assessment & Plan:

## 2013-12-02 ENCOUNTER — Ambulatory Visit: Payer: 59 | Admitting: Family Medicine

## 2014-01-10 ENCOUNTER — Other Ambulatory Visit: Payer: Self-pay | Admitting: Family Medicine

## 2014-02-05 ENCOUNTER — Ambulatory Visit: Payer: Self-pay | Admitting: Family Medicine

## 2014-02-05 ENCOUNTER — Encounter: Payer: Self-pay | Admitting: Family Medicine

## 2014-02-14 ENCOUNTER — Emergency Department (HOSPITAL_COMMUNITY): Payer: 59

## 2014-02-14 ENCOUNTER — Emergency Department (HOSPITAL_COMMUNITY)
Admission: EM | Admit: 2014-02-14 | Discharge: 2014-02-14 | Disposition: A | Discharge status: Home / Self Care | Payer: 59 | Attending: Emergency Medicine | Admitting: Emergency Medicine

## 2014-02-14 DIAGNOSIS — Z86010 Personal history of colonic polyps: Secondary | ICD-10-CM | POA: Insufficient documentation

## 2014-02-14 DIAGNOSIS — E663 Overweight: Secondary | ICD-10-CM | POA: Diagnosis not present

## 2014-02-14 DIAGNOSIS — Z87891 Personal history of nicotine dependence: Secondary | ICD-10-CM | POA: Insufficient documentation

## 2014-02-14 DIAGNOSIS — I10 Essential (primary) hypertension: Secondary | ICD-10-CM | POA: Diagnosis not present

## 2014-02-14 DIAGNOSIS — Z79899 Other long term (current) drug therapy: Secondary | ICD-10-CM | POA: Diagnosis not present

## 2014-02-14 DIAGNOSIS — Z8719 Personal history of other diseases of the digestive system: Secondary | ICD-10-CM | POA: Insufficient documentation

## 2014-02-14 DIAGNOSIS — R51 Headache: Secondary | ICD-10-CM | POA: Insufficient documentation

## 2014-02-14 DIAGNOSIS — R519 Headache, unspecified: Secondary | ICD-10-CM

## 2014-02-14 LAB — CBC WITH DIFFERENTIAL/PLATELET
Basophils Absolute: 0 10*3/uL (ref 0.0–0.1)
Basophils Relative: 1 % (ref 0–1)
EOS ABS: 0.2 10*3/uL (ref 0.0–0.7)
Eosinophils Relative: 4 % (ref 0–5)
HEMATOCRIT: 42.3 % (ref 39.0–52.0)
HEMOGLOBIN: 13.8 g/dL (ref 13.0–17.0)
LYMPHS PCT: 31 % (ref 12–46)
Lymphs Abs: 2 10*3/uL (ref 0.7–4.0)
MCH: 28.9 pg (ref 26.0–34.0)
MCHC: 32.6 g/dL (ref 30.0–36.0)
MCV: 88.5 fL (ref 78.0–100.0)
Monocytes Absolute: 0.6 10*3/uL (ref 0.1–1.0)
Monocytes Relative: 10 % (ref 3–12)
NEUTROS PCT: 54 % (ref 43–77)
Neutro Abs: 3.4 10*3/uL (ref 1.7–7.7)
Platelets: 188 10*3/uL (ref 150–400)
RBC: 4.78 MIL/uL (ref 4.22–5.81)
RDW: 14.1 % (ref 11.5–15.5)
WBC: 6.3 10*3/uL (ref 4.0–10.5)

## 2014-02-14 LAB — COMPREHENSIVE METABOLIC PANEL
ALK PHOS: 111 U/L (ref 39–117)
ALT: 30 U/L (ref 0–53)
ANION GAP: 7 (ref 5–15)
AST: 39 U/L — ABNORMAL HIGH (ref 0–37)
Albumin: 4.3 g/dL (ref 3.5–5.2)
BILIRUBIN TOTAL: 0.7 mg/dL (ref 0.3–1.2)
BUN: 24 mg/dL — AB (ref 6–23)
CO2: 24 mmol/L (ref 19–32)
Calcium: 9.6 mg/dL (ref 8.4–10.5)
Chloride: 105 mEq/L (ref 96–112)
Creatinine, Ser: 0.9 mg/dL (ref 0.50–1.35)
GFR calc Af Amer: >90 mL/min (ref >90)
Glucose, Bld: 104 mg/dL — ABNORMAL HIGH (ref 70–99)
Potassium: 3.8 mmol/L (ref 3.5–5.1)
Sodium: 136 mmol/L (ref 135–145)
Total Protein: 7.6 g/dL (ref 6.0–8.3)

## 2014-02-14 MED ORDER — DEXAMETHASONE SODIUM PHOSPHATE 4 MG/ML IJ SOLN
10.0000 mg | Freq: Once | INTRAMUSCULAR | Status: AC
  Administered 2014-02-14: 10 mg via INTRAVENOUS
  Filled 2014-02-14: qty 3

## 2014-02-14 MED ORDER — PROMETHAZINE HCL 25 MG/ML IJ SOLN
12.5000 mg | Freq: Once | INTRAMUSCULAR | Status: AC
  Administered 2014-02-14: 12.5 mg via INTRAVENOUS
  Filled 2014-02-14: qty 1

## 2014-02-14 MED ORDER — HYDROCODONE-ACETAMINOPHEN 5-325 MG PO TABS: 1.0000 | ORAL_TABLET | ORAL | Status: DC | PRN

## 2014-02-14 MED ORDER — DIPHENHYDRAMINE HCL 50 MG/ML IJ SOLN
25.0000 mg | Freq: Once | INTRAMUSCULAR | Status: AC
  Administered 2014-02-14: 25 mg via INTRAVENOUS
  Filled 2014-02-14: qty 1

## 2014-02-14 MED ORDER — SODIUM CHLORIDE 0.9 % IV SOLN
INTRAVENOUS | Status: DC
  Administered 2014-02-14: 10:00:00 via INTRAVENOUS

## 2014-02-14 MED ORDER — HYDROMORPHONE HCL 1 MG/ML IJ SOLN
1.0000 mg | Freq: Once | INTRAMUSCULAR | Status: AC
  Administered 2014-02-14: 1 mg via INTRAVENOUS
  Filled 2014-02-14: qty 1

## 2014-02-14 NOTE — ED Notes (Signed)
Pt co headache x1 month, denies N/V.

## 2014-02-14 NOTE — Discharge Instructions (Signed)
Do not take the pain medication if driving as it will make you sleepy.

## 2014-02-14 NOTE — ED Provider Notes (Signed)
CSN: [id number]     Arrival date & time 02/14/14  0815 History   First MD Initiated Contact with Patient 02/14/14 [phone]     Chief Complaint  Patient presents with  . Headache     (Consider location/radiation/quality/duration/timing/severity/associated sxs/prior Treatment) Patient is a 54 y.o. male presenting with headaches. The history is provided by the patient.  Headache Location: top of the head. Radiates to:  Does not radiate Severity currently:  10/10 Onset quality:  Gradual Duration:  4 weeks Timing:  Constant Progression:  Worsening Chronicity:  New Similar to prior headaches: no   Relieved by:  Nothing Worsened by:  Nothing tried Ineffective treatments:  Prescription medications and NSAIDs  Bryan Rodgers is a 54 y.o. male who presents to the ED with a headache that he states started about a month ago. He states that for the past year he has has some headaches off and on and his doctor treated him with tramadol. Over the past month they have gotten worse but over the past 24 hours the pain has become severe. He was unable to sleep last night due to the pain. He has taken tramadol, ibuprofen and tylenol without relief.   Past Medical History  Diagnosis Date  . Chest pain     Palpations  . Elevated CPK   . Tobacco abuse   . Overweight (BMI 25.0-29.9)   . Hemorrhoids     with bleeding   . History of colonic polyps     removed via colonscope  . Hypertension   . Prediabetes 10/2012    HBA1C is 6.4  . Alcoholic quit 2831    51-76 beers per day on the weekends    Past Surgical History  Procedure Laterality Date  . Hemmorrhoidectomy  2002   Family History  Problem Relation Age of Onset  . Cancer Mother   . Diabetes Sister   . Cancer Sister    History  Substance Use Topics  . Smoking status: Former Smoker    Quit date: 03/16/2012  . Smokeless tobacco: Not on file  . Alcohol Use: No     Comment: Primarily Beer on the weekends, Sometimes in sizable quantities     Review of Systems  Neurological: Positive for headaches.  all other systems negative    Allergies  Review of patient's allergies indicates no known allergies.  Home Medications   Prior to Admission medications   Medication Sig Start Date End Date Taking? Authorizing Provider  chlorthalidone (HYGROTON) 25 MG tablet Take one-half tablet by  mouth daily 01/10/14  Yes Fayrene Helper, MD  metoprolol succinate (TOPROL-XL) 25 MG 24 hr tablet Take 1 tablet by mouth once daily   Yes Fayrene Helper, MD  omeprazole (PRILOSEC) 40 MG capsule Take 1 capsule by mouth  daily 10/02/13  Yes Fayrene Helper, MD  HYDROCORTISONE ACE, RECTAL, (PROCTOCORT) 30 MG SUPP Place 1 suppository (30 mg total) rectally 2 (two) times daily. For 1 week then as needed Patient not taking: Reported on 02/14/2014 07/16/13   Fayrene Helper, MD  metFORMIN (GLUCOPHAGE) 500 MG tablet Take 1 tablet by mouth  daily with breakfast Patient not taking: Reported on 02/14/2014 08/15/13   Fayrene Helper, MD  tadalafil (CIALIS) 20 MG tablet One tablet every 3 days , as needed, for sexual function Patient not taking: Reported on 02/14/2014 11/15/12   Fayrene Helper, MD  tadalafil (CIALIS) 5 MG tablet two tablets 30 minutes before intercourse, maximum is 4 tablets per  week Patient not taking: Reported on 02/14/2014 07/08/10 10/08/10  Fayrene Helper, MD  traMADol (ULTRAM) 50 MG tablet Take 1 tablet (50 mg total) by mouth at bedtime as needed. Patient not taking: Reported on 02/14/2014 08/29/13   Fayrene Helper, MD   BP 139/81 mmHg  Pulse 77  Temp(Src) 97.5 F (36.4 C) (Oral)  Resp 18  Ht 5\' 10"  (1.778 m)  Wt 191 lb (86.637 kg)  BMI 27.41 kg/m2  SpO2 99% Physical Exam  Constitutional: He is oriented to person, place, and time. He appears well-developed and well-nourished. No distress.  HENT:  Head: Normocephalic and atraumatic.  Right Ear: Tympanic membrane normal.  Left Ear: Tympanic membrane normal.   Nose: Nose normal.  Mouth/Throat: Uvula is midline, oropharynx is clear and moist and mucous membranes are normal.  Eyes: Conjunctivae and EOM are normal.  Neck: Normal range of motion. Neck supple.  Cardiovascular: Normal rate and regular rhythm.   Pulmonary/Chest: Effort normal. He has no wheezes. He has no rales.  Abdominal: Soft. Bowel sounds are normal. He exhibits no mass. There is no tenderness.  Musculoskeletal: He exhibits no edema.  Radial and pedal pulses strong, adequate circulation, good touch sensation.  Neurological: He is alert and oriented to person, place, and time. He has normal strength. No cranial nerve deficit or sensory deficit. He displays a negative Romberg sign. Gait normal.  Reflex Scores:      Bicep reflexes are 2+ on the right side and 2+ on the left side.      Brachioradialis reflexes are 2+ on the right side and 2+ on the left side.      Patellar reflexes are 2+ on the right side and 2+ on the left side.      Achilles reflexes are 2+ on the right side and 2+ on the left side. Rapid alternating movement without difficulty. Stands on one foot without difficulty.  Psychiatric: He has a normal mood and affect. His behavior is normal.    ED Course  Procedures (including critical care time) IV normal saline, Benadryl 25 mg., decadron 10 mg., phenergan 12.5 mg IV., Labs, CT of head.   @ 10:30 after medications patient states head ache is better but still 7/10. Will give Dilaudid 1 mg IV.  Patient's pain is minimal after treatment and he is on his phone and states he is ready to go home.   Labs Review Results for orders placed or performed during the hospital encounter of 02/14/14 (from the past 24 hour(s))  CBC with Differential     Status: None   Collection Time: 02/14/14  9:35 AM  Result Value Ref Range   WBC 6.3 4.0 - 10.5 K/uL   RBC 4.78 4.22 - 5.81 MIL/uL   Hemoglobin 13.8 13.0 - 17.0 g/dL   HCT 42.3 39.0 - 52.0 %   MCV 88.5 78.0 - 100.0 fL   MCH  28.9 26.0 - 34.0 pg   MCHC 32.6 30.0 - 36.0 g/dL   RDW 14.1 11.5 - 15.5 %   Platelets 188 150 - 400 K/uL   Neutrophils Relative % 54 43 - 77 %   Neutro Abs 3.4 1.7 - 7.7 K/uL   Lymphocytes Relative 31 12 - 46 %   Lymphs Abs 2.0 0.7 - 4.0 K/uL   Monocytes Relative 10 3 - 12 %   Monocytes Absolute 0.6 0.1 - 1.0 K/uL   Eosinophils Relative 4 0 - 5 %   Eosinophils Absolute 0.2 0.0 - 0.7  K/uL   Basophils Relative 1 0 - 1 %   Basophils Absolute 0.0 0.0 - 0.1 K/uL    Imaging Review Ct Head Wo Contrast  02/14/2014   CLINICAL DATA:  Headache on top of head since June 2015. Worsening headache  EXAM: CT HEAD WITHOUT CONTRAST  TECHNIQUE: Contiguous axial images were obtained from the base of the skull through the vertex without intravenous contrast.  COMPARISON:  Brain MRI 09/13/2005  FINDINGS: No acute intracranial hemorrhage. No focal mass lesion. No CT evidence of acute infarction. No midline shift or mass effect. No hydrocephalus. Basilar cisterns are patent. Incidental cavum septum pellucidum.  There is scattered fluid within the within the mastoid air cells unchanged from prior. Frontal sinuses clear.  IMPRESSION: No acute intracranial findings.  Scattered fluid the mastoid air cells.   Electronically Signed   By: Suzy Bouchard M.D.   On: 02/14/2014 10:13     MDM  54 y.o. male with headache that has bee off and on x 1 month and became severe today. Stable for discharge with normal neuro exam and headache gone after treatment.  I discussed this case with Dr. Roderic Palau.   Final diagnoses:  Severe headache      Ashley Murrain, NP 02/14/14 [place], MD 02/17/14 [phone]

## 2014-02-18 ENCOUNTER — Ambulatory Visit (INDEPENDENT_AMBULATORY_CARE_PROVIDER_SITE_OTHER): Payer: 59 | Admitting: Family Medicine

## 2014-02-18 ENCOUNTER — Ambulatory Visit (INDEPENDENT_AMBULATORY_CARE_PROVIDER_SITE_OTHER): Payer: 59 | Admitting: *Deleted

## 2014-02-18 ENCOUNTER — Ambulatory Visit (INDEPENDENT_AMBULATORY_CARE_PROVIDER_SITE_OTHER): Payer: 59 | Admitting: Neurology

## 2014-02-18 ENCOUNTER — Encounter: Payer: Self-pay | Admitting: Family Medicine

## 2014-02-18 ENCOUNTER — Encounter: Payer: Self-pay | Admitting: Neurology

## 2014-02-18 VITALS — BP 118/78 | HR 83 | Resp 16 | Ht 70.0 in | Wt 203.0 lb

## 2014-02-18 VITALS — BP 119/74 | HR 81 | Ht 70.5 in | Wt 203.0 lb

## 2014-02-18 DIAGNOSIS — R51 Headache: Secondary | ICD-10-CM

## 2014-02-18 DIAGNOSIS — G43909 Migraine, unspecified, not intractable, without status migrainosus: Secondary | ICD-10-CM

## 2014-02-18 DIAGNOSIS — G44001 Cluster headache syndrome, unspecified, intractable: Secondary | ICD-10-CM

## 2014-02-18 DIAGNOSIS — G44229 Chronic tension-type headache, not intractable: Secondary | ICD-10-CM

## 2014-02-18 DIAGNOSIS — I10 Essential (primary) hypertension: Secondary | ICD-10-CM

## 2014-02-18 DIAGNOSIS — R519 Headache, unspecified: Secondary | ICD-10-CM | POA: Insufficient documentation

## 2014-02-18 MED ORDER — KETOROLAC TROMETHAMINE 30 MG/ML IJ SOLN
30.0000 mg | Freq: Once | INTRAMUSCULAR | Status: AC
  Administered 2014-02-18: 30 mg via INTRAVENOUS

## 2014-02-18 MED ORDER — RIZATRIPTAN BENZOATE 10 MG PO TBDP: 10.0000 mg | ORAL_TABLET | ORAL | Status: DC | PRN

## 2014-02-18 MED ORDER — VALPROATE SODIUM 500 MG/5ML IV SOLN
1000.0000 mg | INTRAVENOUS | Status: DC
  Administered 2014-02-18: 1000 mg via INTRAVENOUS

## 2014-02-18 MED ORDER — NORTRIPTYLINE HCL 10 MG PO CAPS: ORAL_CAPSULE | ORAL | Status: DC

## 2014-02-18 MED ORDER — PROCHLORPERAZINE EDISYLATE 5 MG/ML IJ SOLN
10.0000 mg | Freq: Once | INTRAMUSCULAR | Status: AC
  Administered 2014-02-18: 10 mg via INTRAVENOUS

## 2014-02-18 MED ORDER — BUTALBITAL-APAP-CAFFEINE 50-325-40 MG PO TABS: 2.0000 | ORAL_TABLET | Freq: Two times a day (BID) | ORAL | Status: DC | PRN

## 2014-02-18 NOTE — Progress Notes (Signed)
PATIENT: Bryan Rodgers DOB: Jul 22, 1960  HISTORICAL  Bryan Rodgers is a 54 yo LH male, is accompanied by his wife, referred by his primary care physician Dr. Moshe Cipro for evaluation of headaches   He denied a previous history of headaches, currently work as a Corporate treasurer,   Since June 2015, he has variable degree of negative headaches, pressure, involving lateralized vortex region, or a bandlike sensation involving bilateral frontal, temporal region, sometimes his headache would exacerbate to a much severe pounding headaches, with associated light noise sensitivity, lasting whole day, he has been taking tramadol, over-the-counter medications NSAIDs, Tylenol, without significant improvement   In February 13 2014, he noticed gradual building up of this headaches to a much severe pounding headaches headaches, went to the emergency room, there was no significant light noise sensitivity, CAT scan of the brain showed no significant abnormality, he was giving headache cocktail, which eased up but still with mild degree of headaches,   His headache returned a few days later, to a much more severe degree, he has difficulty focusing, afraid of driving,   But he denies visual loss, no lateralized motor or sensory deficit,    laboratory showed normal CMP, CBC.  REVIEW OF SYSTEMS: Full 14 system review of systems performed and notable only for headaches,  ALLERGIES: No Known Allergies  HOME MEDICATIONS: Current Outpatient Prescriptions on File Prior to Visit  Medication Sig Dispense Refill  . butalbital-acetaminophen-caffeine (FIORICET, ESGIC) 50-325-40 MG per tablet Take 2 tablets by mouth 2 (two) times daily as needed for headache. 14 tablet 0  . chlorthalidone (HYGROTON) 25 MG tablet Take one-half tablet by  mouth daily 45 tablet 1  . HYDROcodone-acetaminophen (NORCO/VICODIN) 5-325 MG per tablet Take 1 tablet by mouth every 4 (four) hours as needed. 15 tablet 0  . HYDROCORTISONE  ACE, RECTAL, (PROCTOCORT) 30 MG SUPP Place 1 suppository (30 mg total) rectally 2 (two) times daily. For 1 week then as needed 28 each 2  . metFORMIN (GLUCOPHAGE) 500 MG tablet Take 1 tablet by mouth  daily with breakfast 90 tablet 1  . metoprolol succinate (TOPROL-XL) 25 MG 24 hr tablet Take 1 tablet by mouth once daily 90 tablet 1  . omeprazole (PRILOSEC) 40 MG capsule Take 1 capsule by mouth  daily 90 capsule 1  . tadalafil (CIALIS) 20 MG tablet One tablet every 3 days , as needed, for sexual function 3 tablet 0  . traMADol (ULTRAM) 50 MG tablet Take 1 tablet (50 mg total) by mouth at bedtime as needed. 30 tablet 3  . tadalafil (CIALIS) 5 MG tablet two tablets 30 minutes before intercourse, maximum is 4 tablets per week (Patient not taking: Reported on 02/14/2014) 30 tablet 0   No current facility-administered medications on file prior to visit.    PAST MEDICAL HISTORY: Past Medical History  Diagnosis Date  . Chest pain     Palpations  . Elevated CPK   . Tobacco abuse   . Overweight (BMI 25.0-29.9)   . Hemorrhoids     with bleeding   . History of colonic polyps     removed via colonscope  . Hypertension   . Prediabetes 10/2012    HBA1C is 6.4  . Alcoholic quit 5638    75-64 beers per day on the weekends     PAST SURGICAL HISTORY: Past Surgical History  Procedure Laterality Date  . Hemmorrhoidectomy  2002    FAMILY HISTORY: Family History  Problem Relation Age of Onset  .  Cancer Mother   . Diabetes Sister   . Cancer Sister     SOCIAL HISTORY:  History   Social History  . Marital Status: Married    Spouse Name: Bryan Rodgers    Number of Children: 0  . Years of Education: 12   Occupational History  . Air traffic controller    Social History Main Topics  . Smoking status: Former Smoker    Quit date: 03/16/2012  . Smokeless tobacco: Never Used  . Alcohol Use: No     Comment: Primarily Beer on the weekends, Sometimes in sizable quantities  . Drug Use: No  . Sexual  Activity: Yes   Other Topics Concern  . Not on file   Social History Narrative   Patient lives with wife and daughter    Patient has no biological children   Patient is left handed    Patient has a high school education      PHYSICAL EXAM   Filed Vitals:   02/18/14 1211  BP: 119/74  Pulse: 81  Height: 5' 10.5" (1.791 m)  Weight: 203 lb (92.08 kg)    Not recorded      Body mass index is 28.71 kg/(m^2).   Generalized: In no acute distress  Neck: Supple, no carotid bruits   Cardiac: Regular rate rhythm  Pulmonary: Clear to auscultation bilaterally  Musculoskeletal: No deformity  Neurological examination  Mentation: Alert oriented to time, place, history taking, and causual conversation  Cranial nerve II-XII: Pupils were equal round reactive to light. Extraocular movements were full.  Visual field were full on confrontational test. Bilateral fundi were sharp.  Facial sensation and strength were normal. Hearing was intact to finger rubbing bilaterally. Uvula tongue midline.  Head turning and shoulder shrug and were normal and symmetric.Tongue protrusion into cheek strength was normal.  Motor: Normal tone, bulk and strength.  Sensory: Intact to fine touch, pinprick, preserved vibratory sensation, and proprioception at toes.  Coordination: Normal finger to nose, heel-to-shin bilaterally there was no truncal ataxia  Gait: Rising up from seated position without assistance, normal stance, without trunk ataxia, moderate stride, good arm swing, smooth turning, able to perform tiptoe, and heel walking without difficulty.   Romberg signs: Negative  Deep tendon reflexes: Brachioradialis 2/2, biceps 2/2, triceps 2/2, patellar 2/2, Achilles 2/2, plantar responses were flexor bilaterally.   DIAGNOSTIC DATA (LABS, IMAGING, TESTING) - I reviewed patient records, labs, notes, testing and imaging myself where available.  Lab Results  Component Value Date   WBC 6.3 02/14/2014     HGB 13.8 02/14/2014   HCT 42.3 02/14/2014   MCV 88.5 02/14/2014   PLT 188 02/14/2014      Component Value Date/Time   NA 136 02/14/2014 0935   K 3.8 02/14/2014 0935   CL 105 02/14/2014 0935   CO2 24 02/14/2014 0935   GLUCOSE 104* 02/14/2014 0935   BUN 24* 02/14/2014 0935   CREATININE 0.90 02/14/2014 0935   CREATININE 0.96 07/16/2013 0942   CALCIUM 9.6 02/14/2014 0935   PROT 7.6 02/14/2014 0935   ALBUMIN 4.3 02/14/2014 0935   AST 39* 02/14/2014 0935   ALT 30 02/14/2014 0935   ALKPHOS 111 02/14/2014 0935   BILITOT 0.7 02/14/2014 0935   GFRNONAA >90 02/14/2014 0935   GFRNONAA 89 07/16/2013 0942   GFRAA >90 02/14/2014 0935   GFRAA >89 07/16/2013 0942   Lab Results  Component Value Date   CHOL 127 07/16/2013   HDL 31* 07/16/2013   LDLCALC 67 07/16/2013  LDLDIRECT 48 07/08/2010   TRIG 144 07/16/2013   CHOLHDL 4.1 07/16/2013   Lab Results  Component Value Date   HGBA1C 6.4* 07/16/2013   No results found for: [id number] Lab Results  Component Value Date   TSH 3.401 03/10/2011    ASSESSMENT AND PLAN  XAVIEN DAUPHINAIS is a 54 y.o. male  With headaches for few months, normal neurological examination, no significant abnormality on CAT scan of the brain  1, his headache has some migraine features, IV infusion at office today 2. Nortriptyline 10 mg, titrating to 20 mg every night as migraine prevention Maxalt as needed 3. Return to clinic in 2- 3 weeks     No orders of the defined types were placed in this encounter.      Return in about 3 weeks (around 03/11/2014). Marcial Pacas, M.D. Ph.D.  [facility] [address] [place],  [address] [phone]

## 2014-02-18 NOTE — Progress Notes (Addendum)
Pt to treatment.  Order for Depacon 500mg  IV, Toradol 30 IVP, Compazine 10mg  IVP per Dr. Krista Blue.  Level 8.  Infusion complete Level to 5.  Late entry:  Noberto Retort, RN did the infusion.

## 2014-02-18 NOTE — Patient Instructions (Signed)
Pt to call back as needed.

## 2014-02-18 NOTE — Patient Instructions (Signed)
F/u in 4 month, call if you need me before  You have been given two tylenol tablets in the office  You neurologic exam is normal  You have an appointment today  To see the neurologist in [place], you NEED to keep  this appointment  Fioricet is sent to your pharmacy to see if this will give you some relief this morning

## 2014-02-18 NOTE — Progress Notes (Signed)
   Subjective:    Patient ID: Bryan Rodgers, male    DOB: Aug 01, 1960, 54 y.o.   MRN: [id number]  HPI 5 day h/o headache , constant, throbbing, started on left side , now involves entire head esp the crown Treated in the ED, no relief, pt afraid to drive due to symptom severity.Denies vision disturbance, weakness or numbness, head scan normal in ED. No good rest , headache awakens him States he has been having headaches since past 6 months, but not this severe No known f/h brain tumor or aneurysm   Review of Systems See HPI Denies recent fever or chills. Denies sinus pressure, nasal congestion, ear pain or sore throat. Denies chest congestion, productive cough or wheezing. Denies chest pains, palpitations and leg swelling Denies abdominal pain, nausea, vomiting,diarrhea or constipation.   Denies , seizures, numbness, or tingling. Denies depression, has  anxiety and  Insomnia. Due to headache Denies skin break down or rash.        Objective:   Physical Exam BP 118/78 mmHg  Pulse 83  Resp 16  Ht 5\' 10"  (1.778 m)  Wt 203 lb (92.08 kg)  BMI 29.13 kg/m2  SpO2 96% Patient alert and oriented and in no cardiopulmonary distress.Pt in pain  HEENT: No facial asymmetry, EOMI,   oropharynx pink and moist.  Neck supple no JVD, no mass.  Chest: Clear to auscultation bilaterally.  CVS: S1, S2 no murmurs, no S3.Regular rate.  ABD: Soft non tender.   Ext: No edema  MS: Adequate ROM spine, shoulders, hips and knees.  Skin: Intact, no ulcerations or rash noted.  Psych: Good eye contact, normal affect. Memory intact not anxious or depressed appearing.  CNS: CN 2-12 intact, power,  normal throughout.no focal deficits noted.        Assessment & Plan:  Sudden onset of severe headache 5 day h/o unrelenting severe headache, no response to Ed treatment, disabling. Neurologic exam normal, lilkely cluster migraine, has past headache history, with no dx of migraine, due t symptom  severity attempt neurology eval asap, if unable to be seen today will try to obtain MRI/MRA due to symptom severity   HTN (hypertension) Controlled, no change in medication

## 2014-02-18 NOTE — Addendum Note (Signed)
Addended by: Marcial Pacas on: 02/18/2014 04:22 PM   Modules accepted: Level of Service

## 2014-02-19 ENCOUNTER — Telehealth: Payer: Self-pay | Admitting: Neurology

## 2014-02-19 MED ORDER — RIZATRIPTAN BENZOATE 10 MG PO TBDP: 10.0000 mg | ORAL_TABLET | ORAL | Status: DC | PRN

## 2014-02-19 MED ORDER — NORTRIPTYLINE HCL 10 MG PO CAPS: ORAL_CAPSULE | ORAL | Status: DC

## 2014-02-19 NOTE — Telephone Encounter (Signed)
Pt's wife is calling stating she needs for nortriptyline (PAMELOR) 10 MG capsule and rizatriptan (MAXALT-MLT) 10 MG disintegrating tablet to be sent to [facility].  He needs this medication now and can't wait for mail order.  Please call and advise.

## 2014-02-19 NOTE — Telephone Encounter (Signed)
Rx's have been sent.  I called back, got no answer.  Left message.  

## 2014-02-20 NOTE — Assessment & Plan Note (Deleted)
Controlled, no change in medication  

## 2014-02-20 NOTE — Assessment & Plan Note (Signed)
5 day h/o unrelenting severe headache, no response to Ed treatment, disabling. Neurologic exam normal, lilkely cluster migraine, has past headache history, with no dx of migraine, due t symptom severity attempt neurology eval asap, if unable to be seen today will try to obtain MRI/MRA due to symptom severity

## 2014-02-20 NOTE — Assessment & Plan Note (Signed)
Controlled, no change in medication  

## 2014-02-22 ENCOUNTER — Telehealth: Payer: Self-pay | Admitting: Family Medicine

## 2014-02-22 DIAGNOSIS — E119 Type 2 diabetes mellitus without complications: Secondary | ICD-10-CM

## 2014-02-22 DIAGNOSIS — Z13220 Encounter for screening for lipoid disorders: Secondary | ICD-10-CM

## 2014-02-22 DIAGNOSIS — I10 Essential (primary) hypertension: Secondary | ICD-10-CM

## 2014-02-22 DIAGNOSIS — R7303 Prediabetes: Secondary | ICD-10-CM

## 2014-02-22 DIAGNOSIS — R7301 Impaired fasting glucose: Secondary | ICD-10-CM

## 2014-02-22 NOTE — Telephone Encounter (Signed)
Pls call pt , se how he is doiug as far as headache is concerned Pls offer flu vaccine, and arrange for hoim to get it if he agrees Needs HBA1c drawn also, was 6.4 last June, thanks

## 2014-02-24 ENCOUNTER — Telehealth: Payer: Self-pay | Admitting: Family Medicine

## 2014-02-25 NOTE — Telephone Encounter (Signed)
Concern addressed

## 2014-02-25 NOTE — Telephone Encounter (Signed)
Patient aware and will come friday

## 2014-02-25 NOTE — Addendum Note (Signed)
Addended by: Eual Fines on: 02/25/2014 09:04 AM   Modules accepted: Orders

## 2014-02-27 ENCOUNTER — Ambulatory Visit (INDEPENDENT_AMBULATORY_CARE_PROVIDER_SITE_OTHER): Payer: 59

## 2014-02-27 DIAGNOSIS — Z23 Encounter for immunization: Secondary | ICD-10-CM

## 2014-02-27 LAB — HEMOGLOBIN A1C
Hgb A1c MFr Bld: 6.7 % — ABNORMAL HIGH (ref <5.7)
Mean Plasma Glucose: 146 mg/dL — ABNORMAL HIGH (ref <117)

## 2014-02-28 NOTE — Addendum Note (Signed)
Addended by: Eual Fines on: 02/28/2014 09:33 AM   Modules accepted: Orders

## 2014-03-14 ENCOUNTER — Other Ambulatory Visit: Payer: Self-pay | Admitting: Family Medicine

## 2014-04-03 ENCOUNTER — Telehealth: Payer: Self-pay | Admitting: *Deleted

## 2014-04-03 ENCOUNTER — Ambulatory Visit: Payer: 59 | Admitting: Neurology

## 2014-04-03 NOTE — Telephone Encounter (Signed)
Patient cancel appt same day - called to tell us he has the flu.

## 2014-04-16 ENCOUNTER — Telehealth: Payer: Self-pay | Admitting: Family Medicine

## 2014-04-16 ENCOUNTER — Other Ambulatory Visit: Payer: Self-pay

## 2014-04-16 DIAGNOSIS — R7303 Prediabetes: Secondary | ICD-10-CM

## 2014-04-16 NOTE — Telephone Encounter (Signed)
Called patient twice, no answer. Mailed lab order and left message on voicemail that this was being done and a new appt card was given and to call if time didn't work for them

## 2014-04-16 NOTE — Telephone Encounter (Signed)
Pt needs rept HBA1C  And other labs noted in result note from January  Done Aopril 26 or shortlu after, hee also needs appt scheduleed first week in May, pls do this. Check on the resuklt note you handled on Jan 2016 for clarification of what is needed  ?? pls ask

## 2014-06-10 ENCOUNTER — Encounter: Payer: Self-pay | Admitting: *Deleted

## 2014-06-10 ENCOUNTER — Ambulatory Visit: Payer: 59 | Admitting: Family Medicine

## 2014-07-21 ENCOUNTER — Encounter: Payer: 59 | Admitting: Family Medicine

## 2014-07-23 ENCOUNTER — Other Ambulatory Visit: Payer: Self-pay | Admitting: Family Medicine

## 2014-07-23 ENCOUNTER — Encounter: Payer: Self-pay | Admitting: Family Medicine

## 2014-07-23 ENCOUNTER — Ambulatory Visit (INDEPENDENT_AMBULATORY_CARE_PROVIDER_SITE_OTHER): Payer: 59 | Admitting: Family Medicine

## 2014-07-23 VITALS — BP 124/80 | HR 80 | Resp 16 | Ht 70.5 in | Wt 203.0 lb

## 2014-07-23 DIAGNOSIS — R972 Elevated prostate specific antigen [PSA]: Secondary | ICD-10-CM

## 2014-07-23 DIAGNOSIS — E119 Type 2 diabetes mellitus without complications: Secondary | ICD-10-CM

## 2014-07-23 DIAGNOSIS — K219 Gastro-esophageal reflux disease without esophagitis: Secondary | ICD-10-CM | POA: Diagnosis not present

## 2014-07-23 DIAGNOSIS — I10 Essential (primary) hypertension: Secondary | ICD-10-CM | POA: Diagnosis not present

## 2014-07-23 DIAGNOSIS — R21 Rash and other nonspecific skin eruption: Secondary | ICD-10-CM

## 2014-07-23 DIAGNOSIS — G43909 Migraine, unspecified, not intractable, without status migrainosus: Secondary | ICD-10-CM | POA: Insufficient documentation

## 2014-07-23 DIAGNOSIS — G43009 Migraine without aura, not intractable, without status migrainosus: Secondary | ICD-10-CM | POA: Diagnosis not present

## 2014-07-23 DIAGNOSIS — M549 Dorsalgia, unspecified: Secondary | ICD-10-CM | POA: Diagnosis not present

## 2014-07-23 MED ORDER — PREDNISONE 5 MG PO TABS: 5.0000 mg | ORAL_TABLET | Freq: Two times a day (BID) | ORAL | Status: AC

## 2014-07-23 MED ORDER — METFORMIN HCL 500 MG PO TABS: ORAL_TABLET | ORAL | Status: DC

## 2014-07-23 MED ORDER — NORTRIPTYLINE HCL 25 MG PO CAPS: 25.0000 mg | ORAL_CAPSULE | Freq: Every day | ORAL | Status: DC

## 2014-07-23 MED ORDER — RIZATRIPTAN BENZOATE 10 MG PO TBDP: 10.0000 mg | ORAL_TABLET | ORAL | Status: DC | PRN

## 2014-07-23 MED ORDER — TRAMADOL HCL 50 MG PO TABS: ORAL_TABLET | ORAL | Status: DC

## 2014-07-23 MED ORDER — CHLORTHALIDONE 25 MG PO TABS: ORAL_TABLET | ORAL | Status: DC

## 2014-07-23 NOTE — Progress Notes (Signed)
Subjective:    Patient ID: Bryan Rodgers, male    DOB: 11-07-1960, 54 y.o.   MRN: [id number]  HPI   Bryan Rodgers     MRN: [id number]      DOB: [birth year]   HPI Bryan Rodgers is here for follow up and re-evaluation of chronic medical conditions, medication management and review of any available recent lab and radiology data.  Preventive health is updated, specifically  Cancer screening and Immunization.   Questions or concerns regarding consultations or procedures which the PT has had in the interim are  addressed. The PT denies any adverse reactions to current medications since the last visit.  1 week h/o rash coming up as bumps on his forearms which itch, unknown cause Requests medication for headaches and back pain be refilled   ROS Denies recent fever or chills. Denies sinus pressure, nasal congestion, ear pain or sore throat. Denies chest congestion, productive cough or wheezing. Denies chest pains, palpitations and leg swelling Denies abdominal pain, nausea, vomiting,diarrhea or constipation.   Denies dysuria, frequency, hesitancy or incontinence. Denies uncontrolled  joint pain, swelling and limitation in mobility.Reports daily low back pain esp on days that he works Denies uncontrolled  headaches,  Denies seizures, numbness, or tingling. Denies depression, anxiety or insomnia.   PE  BP 124/80 mmHg  Pulse 80  Resp 16  Ht 5' 10.5" (1.791 m)  Wt 203 lb (92.08 kg)  BMI 28.71 kg/m2  SpO2 97%  Patient alert and oriented and in no cardiopulmonary distress.  HEENT: No facial asymmetry, EOMI,   oropharynx pink and moist.  Neck supple no JVD, no mass.  Chest: Clear to auscultation bilaterally.  CVS: S1, S2 no murmurs, no S3.Regular rate.  ABD: Soft non tender.   Ext: No edema  MS: Adequate ROM spine, shoulders, hips and knees.  Skin: Papular  rash noted on forearms  Psych: Good eye contact, normal affect. Memory intact not anxious or depressed  appearing.  CNS: CN 2-12 intact, power,  normal throughout.no focal deficits noted.   Assessment & Plan   HTN (hypertension) Controlled, no change in medication DASH diet and commitment to daily physical activity for a minimum of 30 minutes discussed and encouraged, as a part of hypertension management. The importance of attaining a healthy weight is also discussed.  BP/Weight 07/23/2014 02/18/2014 02/18/2014 02/14/2014 07/16/2013 01/14/2013 38/46/6599  Systolic BP 357 017 793 903 009 233 007  Diastolic BP 80 74 78 81 78 78 66  Wt. (Lbs) 203 203 203 191 197.12 207.4 199.04  BMI 28.71 28.71 29.13 27.41 28.28 29.76 28.56        Migraine headache Improved,  However, has not been taking prophylactic med as  Prescribed, educated on this, he will also keep a headache diary, medication refilled per request  GERD (gastroesophageal reflux disease) Controlled, no change in management   Type 2 diabetes mellitus Bryan Rodgers new diagnosis after OV, will need to be recalled sooner to have this addressed  Diabetic Labs Latest Ref Rng 07/23/2014 02/25/2014 02/14/2014 07/16/2013 11/15/2012  HbA1c <5.7 % 6.7(H) 6.7(H) - 6.4(H) 6.4(H)  Chol 0 - 200 mg/dL - - - 127 -  HDL >39 mg/dL - - - 31(L) -  Calc LDL 0 - 99 mg/dL - - - 67 -  Triglycerides <150 mg/dL - - - 144 -  Creatinine 0.50 - 1.35 mg/dL 1.08 - 0.90 0.96 0.97   BP/Weight 07/23/2014 02/18/2014 02/18/2014 02/14/2014 07/16/2013 01/14/2013 62/26/3335  Systolic BP 456 256  118 139 161 096 045  Diastolic BP 80 74 78 81 78 78 66  Wt. (Lbs) 203 203 203 191 197.12 207.4 199.04  BMI 28.71 28.71 29.13 27.41 28.28 29.76 28.56   No flowsheet data found.  Updated lab needed at/ before next visit.        Back pain with radiation Chronic and unchanged, medication refilled as before, weight loss, back strengthening exercises encouraged  Elevated PSA Followed by urology  Rash and nonspecific skin eruption Short course of oral prednisone  prescribed, appears allergic based, though pt uncertain of the allergen, he is to call back if persists or worsens      Review of Systems     Objective:   Physical Exam        Assessment & Plan:

## 2014-07-23 NOTE — Patient Instructions (Signed)
CPE in 5 month, call if you need me before  It is important that you exercise regularly at least 30 minutes 5 times a week. If you develop chest pain, have severe difficulty breathing, or feel very tired, stop exercising immediately and seek medical attention   Take med to prevent headaches every night please and start a diary   HIV test to lab just drawn  Please work on good  health habits so that your health will improve. 1. Commitment to daily physical activity for 30 to 60  minutes, if you are able to do this.  2. Commitment to wise food choices. Aim for half of your  food intake to be vegetable and fruit, one quarter starchy foods, and one quarter protein. Try to eat on a regular schedule  3 meals per day, snacking between meals should be limited to vegetables or fruits or small portions of nuts. 64 ounces of water per day is generally recommended, unless you have specific health conditions, like heart failure or kidney failure where you will need to limit fluid intake.  3. Commitment to sufficient and a  good quality of physical and mental rest daily, generally between 6 to 8 hours per day.  WITH PERSISTANCE AND PERSEVERANCE, THE IMPOSSIBLE , BECOMES THE NORM!   Thanks for choosing [facility], we consider it a privelige to serve you.

## 2014-07-24 LAB — BASIC METABOLIC PANEL WITH GFR
BUN: 19 mg/dL (ref 6–23)
CO2: 24 mEq/L (ref 19–32)
Calcium: 9.6 mg/dL (ref 8.4–10.5)
Chloride: 104 mEq/L (ref 96–112)
Creat: 1.08 mg/dL (ref 0.50–1.35)
GFR, EST AFRICAN AMERICAN: 89 mL/min
GFR, EST NON AFRICAN AMERICAN: 77 mL/min
Glucose, Bld: 114 mg/dL — ABNORMAL HIGH (ref 70–99)
Potassium: 3.9 mEq/L (ref 3.5–5.3)
SODIUM: 139 meq/L (ref 135–145)

## 2014-07-24 LAB — HEMOGLOBIN A1C
Hgb A1c MFr Bld: 6.7 % — ABNORMAL HIGH (ref <5.7)
Mean Plasma Glucose: 146 mg/dL — ABNORMAL HIGH (ref <117)

## 2014-07-25 LAB — HIV ANTIBODY (ROUTINE TESTING W REFLEX): HIV: NONREACTIVE

## 2014-07-26 ENCOUNTER — Encounter: Payer: Self-pay | Admitting: Family Medicine

## 2014-08-04 ENCOUNTER — Telehealth: Payer: Self-pay | Admitting: Family Medicine

## 2014-08-04 DIAGNOSIS — R21 Rash and other nonspecific skin eruption: Secondary | ICD-10-CM | POA: Insufficient documentation

## 2014-08-04 NOTE — Assessment & Plan Note (Signed)
Chronic and unchanged, medication refilled as before, weight loss, back strengthening exercises encouraged

## 2014-08-04 NOTE — Assessment & Plan Note (Signed)
Improved,  However, has not been taking prophylactic med as  Prescribed, educated on this, he will also keep a headache diary, medication refilled per request

## 2014-08-04 NOTE — Assessment & Plan Note (Signed)
Followed by urology.   

## 2014-08-04 NOTE — Assessment & Plan Note (Signed)
Short course of oral prednisone prescribed, appears allergic based, though pt uncertain of the allergen, he is to call back if persists or worsens

## 2014-08-04 NOTE — Assessment & Plan Note (Signed)
Controlled, no change in medication DASH diet and commitment to daily physical activity for a minimum of 30 minutes discussed and encouraged, as a part of hypertension management. The importance of attaining a healthy weight is also discussed.  BP/Weight 07/23/2014 02/18/2014 02/18/2014 02/14/2014 07/16/2013 01/14/2013 71/29/2909  Systolic BP 030 149 969 249 324 199 144  Diastolic BP 80 74 78 81 78 78 66  Wt. (Lbs) 203 203 203 191 197.12 207.4 199.04  BMI 28.71 28.71 29.13 27.41 28.28 29.76 28.56

## 2014-08-04 NOTE — Assessment & Plan Note (Signed)
Controlled , no change in management 

## 2014-08-04 NOTE — Telephone Encounter (Signed)
pls schedule appt in next 2 to 3 weeks to review recent labs as he is now a diabetic, and will need additional health care  ?? pls ASK

## 2014-08-04 NOTE — Assessment & Plan Note (Signed)
Bryan Rodgers new diagnosis after OV, will need to be recalled sooner to have this addressed  Diabetic Labs Latest Ref Rng 07/23/2014 02/25/2014 02/14/2014 07/16/2013 11/15/2012  HbA1c <5.7 % 6.7(H) 6.7(H) - 6.4(H) 6.4(H)  Chol 0 - 200 mg/dL - - - 127 -  HDL >39 mg/dL - - - 31(L) -  Calc LDL 0 - 99 mg/dL - - - 67 -  Triglycerides <150 mg/dL - - - 144 -  Creatinine 0.50 - 1.35 mg/dL 1.08 - 0.90 0.96 0.97   BP/Weight 07/23/2014 02/18/2014 02/18/2014 02/14/2014 07/16/2013 01/14/2013 62/13/0865  Systolic BP 784 696 295 284 132 440 102  Diastolic BP 80 74 78 81 78 78 66  Wt. (Lbs) 203 203 203 191 197.12 207.4 199.04  BMI 28.71 28.71 29.13 27.41 28.28 29.76 28.56   No flowsheet data found.  Updated lab needed at/ before next visit.

## 2014-08-05 NOTE — Telephone Encounter (Signed)
Patient has scheduled an appointment 

## 2014-08-05 NOTE — Telephone Encounter (Signed)
Left message for patient to call back  

## 2014-08-12 NOTE — Addendum Note (Signed)
Addended by: Eual Fines on: 08/12/2014 02:30 PM   Modules accepted: Orders

## 2014-08-17 LAB — MICROALBUMIN / CREATININE URINE RATIO
Creatinine, Urine: 122.3 mg/dL
MICROALB UR: 0.9 mg/dL (ref <2.0)
Microalb Creat Ratio: 7.4 mg/g (ref 0.0–30.0)

## 2014-08-17 LAB — COMPLETE METABOLIC PANEL WITH GFR
ALBUMIN: 4.4 g/dL (ref 3.5–5.2)
ALT: 22 U/L (ref 0–53)
AST: 27 U/L (ref 0–37)
Alkaline Phosphatase: 119 U/L — ABNORMAL HIGH (ref 39–117)
BUN: 26 mg/dL — ABNORMAL HIGH (ref 6–23)
CALCIUM: 9.4 mg/dL (ref 8.4–10.5)
CO2: 25 meq/L (ref 19–32)
Chloride: 102 mEq/L (ref 96–112)
Creat: 0.93 mg/dL (ref 0.50–1.35)
GFR, Est Non African American: >89 mL/min
Glucose, Bld: 89 mg/dL (ref 70–99)
Potassium: 4.1 mEq/L (ref 3.5–5.3)
SODIUM: 137 meq/L (ref 135–145)
Total Bilirubin: 0.4 mg/dL (ref 0.2–1.2)
Total Protein: 7.3 g/dL (ref 6.0–8.3)

## 2014-08-17 LAB — LIPID PANEL
CHOL/HDL RATIO: 4.1 ratio
CHOLESTEROL: 112 mg/dL (ref 0–200)
HDL: 27 mg/dL — ABNORMAL LOW (ref >=40)
LDL CALC: 30 mg/dL (ref 0–99)
Triglycerides: 277 mg/dL — ABNORMAL HIGH (ref <150)
VLDL: 55 mg/dL — ABNORMAL HIGH (ref 0–40)

## 2014-08-17 LAB — TSH: TSH: 1.948 u[IU]/mL (ref 0.350–4.500)

## 2014-08-21 ENCOUNTER — Ambulatory Visit (INDEPENDENT_AMBULATORY_CARE_PROVIDER_SITE_OTHER): Payer: 59 | Admitting: Family Medicine

## 2014-08-21 ENCOUNTER — Encounter: Payer: Self-pay | Admitting: Family Medicine

## 2014-08-21 VITALS — BP 114/70 | HR 81 | Resp 16 | Ht 71.0 in | Wt 204.0 lb

## 2014-08-21 DIAGNOSIS — B351 Tinea unguium: Secondary | ICD-10-CM | POA: Diagnosis not present

## 2014-08-21 DIAGNOSIS — R972 Elevated prostate specific antigen [PSA]: Secondary | ICD-10-CM

## 2014-08-21 DIAGNOSIS — E1140 Type 2 diabetes mellitus with diabetic neuropathy, unspecified: Secondary | ICD-10-CM | POA: Diagnosis not present

## 2014-08-21 DIAGNOSIS — I10 Essential (primary) hypertension: Secondary | ICD-10-CM

## 2014-08-21 DIAGNOSIS — Z23 Encounter for immunization: Secondary | ICD-10-CM

## 2014-08-21 DIAGNOSIS — E119 Type 2 diabetes mellitus without complications: Secondary | ICD-10-CM

## 2014-08-21 DIAGNOSIS — E663 Overweight: Secondary | ICD-10-CM

## 2014-08-21 DIAGNOSIS — Z13220 Encounter for screening for lipoid disorders: Secondary | ICD-10-CM

## 2014-08-21 DIAGNOSIS — G441 Vascular headache, not elsewhere classified: Secondary | ICD-10-CM

## 2014-08-21 MED ORDER — TERBINAFINE HCL 250 MG PO TABS: 250.0000 mg | ORAL_TABLET | Freq: Every day | ORAL | Status: DC

## 2014-08-21 MED ORDER — METFORMIN HCL 500 MG PO TABS: 500.0000 mg | ORAL_TABLET | Freq: Two times a day (BID) | ORAL | Status: DC

## 2014-08-21 NOTE — Assessment & Plan Note (Signed)
After obtaining informed consent, the vaccine is  administered by LPN.  

## 2014-08-21 NOTE — Patient Instructions (Signed)
F/u in 1 2 weeks , call if you need me before  You are referred to diabetic educator  Commit to 40 minutes daily of exercise  Change eating habits as discussed  Aim for 6 to 8 pound weight loss  You are referred for eye exam and foot care  Increase metformin to one twice daily  Pneumonia vaccine today  Test blood sugar once daily and recor  Goal for fasting blood sugar ranges from 80 to 120 and 2 hours after any meal or at bedtime should be between 130 to 170.   Fasting lipid, cchem 7 and EGFR, hBA1C in 12 weeks  Thanks for choosing [facility], we consider it a privelige to serve you.   Please work on good  health habits so that your health will improve. 1. Commitment to daily physical activity for 30 to 60  minutes, if you are able to do this.  2. Commitment to wise food choices. Aim for half of your  food intake to be vegetable and fruit, one quarter starchy foods, and one quarter protein. Try to eat on a regular schedule  3 meals per day, snacking between meals should be limited to vegetables or fruits or small portions of nuts. 64 ounces of water per day is generally recommended, unless you have specific health conditions, like heart failure or kidney failure where you will need to limit fluid intake.  3. Commitment to sufficient and a  good quality of physical and mental rest daily, generally between 6 to 8 hours per day.  WITH PERSISTANCE AND PERSEVERANCE, THE IMPOSSIBLE , BECOMES THE NORM!

## 2014-08-22 DIAGNOSIS — E663 Overweight: Secondary | ICD-10-CM | POA: Insufficient documentation

## 2014-08-22 NOTE — Assessment & Plan Note (Signed)
Followed by urology, no diagnosis of cancer

## 2014-08-22 NOTE — Assessment & Plan Note (Signed)
severe bilateral onychomycosis, terbinafine prescribed for 3 month

## 2014-08-22 NOTE — Assessment & Plan Note (Signed)
Improved and controlled by current medication

## 2014-08-22 NOTE — Assessment & Plan Note (Signed)
Controlled, no change in medication DASH diet and commitment to daily physical activity for a minimum of 30 minutes discussed and encouraged, as a part of hypertension management. The importance of attaining a healthy weight is also discussed.  BP/Weight 08/21/2014 07/23/2014 02/18/2014 02/18/2014 02/14/2014 07/16/2013 42/87/6811  Systolic BP 572 620 355 974 163 845 364  Diastolic BP 70 80 74 78 81 78 78  Wt. (Lbs) 204 203 203 203 191 197.12 207.4  BMI 28.46 28.71 28.71 29.13 27.41 28.28 29.76

## 2014-08-22 NOTE — Progress Notes (Signed)
Bryan Rodgers     MRN: [id number]      DOB: 05-30-1960   HPI Bryan Rodgers is , primarily to discuss  And educate him re his new diagnosis of diabetes, coordination of new care requirements which this brings  And for follow up and re-evaluation of chronic medical conditions, medication management and review of any available recent lab and radiology data.  Preventive health is updated, specifically  Cancer screening and Immunization.   Questions or concerns regarding consultations or procedures which the PT has had in the interim are  addressed. The PT denies any adverse reactions to current medications since the last visit.  He is very engaged as far as attaining and maintaining improved health  ROS Denies recent fever or chills. Denies sinus pressure, nasal congestion, ear pain or sore throat. Denies chest congestion, productive cough or wheezing. Denies chest pains, palpitations and leg swelling Denies abdominal pain, nausea, vomiting,diarrhea or constipation.   Denies dysuria, frequency, hesitancy or incontinence. Denies joint pain, swelling and limitation in mobility. Denies headaches, seizures, numbness, or tingling. Denies depression, anxiety or insomnia. Denies skin break down or rash.   PE  BP 114/70 mmHg  Pulse 81  Resp 16  Ht 5\' 11"  (1.803 m)  Wt 204 lb (92.534 kg)  BMI 28.46 kg/m2  SpO2 98%  Patient alert and oriented and in no cardiopulmonary distress.  HEENT: No facial asymmetry, EOMI,   oropharynx pink and moist.  Neck supple no JVD, no mass.  Chest: Clear to auscultation bilaterally.  CVS: S1, S2 no murmurs, no S3.Regular rate.  ABD: Soft non tender.   Ext: No edema  MS: Adequate ROM spine, shoulders, hips and knees.  Skin: Intact, no ulcerations or rash noted.  Psych: Good eye contact, normal affect. Memory intact not anxious or depressed appearing.  CNS: CN 2-12 intact, power,  normal throughout.no focal deficits noted.   Assessment &  Plan   Need for prophylactic vaccination against Streptococcus pneumoniae (pneumococcus) After obtaining informed consent, the vaccine is  administered by LPN.   Type 2 diabetes, controlled, with neuropathy Increase dose of metformin Re educated at visit re diabetes and management esp importance of diet and regular physical activity, and need for daily foot exam Referred for more intense diabetic ed which he is very interested in Pneumonia vaccine administered also  Onychomycosis severe bilateral onychomycosis, terbinafine prescribed for 3 month  Elevated PSA Followed by urology, no diagnosis of cancer  Headache Improved and controlled by current medication  HTN (hypertension) Controlled, no change in medication DASH diet and commitment to daily physical activity for a minimum of 30 minutes discussed and encouraged, as a part of hypertension management. The importance of attaining a healthy weight is also discussed.  BP/Weight 08/21/2014 07/23/2014 02/18/2014 02/18/2014 02/14/2014 07/16/2013 19/37/9024  Systolic BP 097 353 299 242 683 419 622  Diastolic BP 70 80 74 78 81 78 78  Wt. (Lbs) 204 203 203 203 191 197.12 207.4  BMI 28.46 28.71 28.71 29.13 27.41 28.28 29.76        Overweight (BMI 25.0-29.9) Deteriorated. Patient re-educated about  the importance of commitment to a  minimum of 150 minutes of exercise per week.  The importance of healthy food choices with portion control discussed. Encouraged to start a food diary, count calories and to consider  joining a support group. Sample diet sheets offered. Goals set by the patient for the next several months.   Weight /BMI 08/21/2014 07/23/2014 02/18/2014  WEIGHT 204  lb 203 lb 203 lb  HEIGHT 5\' 11"  5' 10.5" 5' 10.5"  BMI 28.46 kg/m2 28.71 kg/m2 28.71 kg/m2    Current exercise per week 100 minutes.

## 2014-08-22 NOTE — Assessment & Plan Note (Signed)
Increase dose of metformin Re educated at visit re diabetes and management esp importance of diet and regular physical activity, and need for daily foot exam Referred for more intense diabetic ed which he is very interested in Pneumonia vaccine administered also

## 2014-08-23 NOTE — Assessment & Plan Note (Signed)
Deteriorated. Patient re-educated about  the importance of commitment to a  minimum of 150 minutes of exercise per week.  The importance of healthy food choices with portion control discussed. Encouraged to start a food diary, count calories and to consider  joining a support group. Sample diet sheets offered. Goals set by the patient for the next several months.   Weight /BMI 08/21/2014 07/23/2014 02/18/2014  WEIGHT 204 lb 203 lb 203 lb  HEIGHT 5\' 11"  5' 10.5" 5' 10.5"  BMI 28.46 kg/m2 28.71 kg/m2 28.71 kg/m2    Current exercise per week 100 minutes.

## 2014-09-06 ENCOUNTER — Other Ambulatory Visit: Payer: Self-pay | Admitting: Family Medicine

## 2014-09-12 NOTE — Addendum Note (Signed)
Addended by: Denman George B on: 09/12/2014 01:19 PM   Modules accepted: Orders

## 2014-10-15 ENCOUNTER — Other Ambulatory Visit: Payer: Self-pay | Admitting: Family Medicine

## 2014-11-25 ENCOUNTER — Ambulatory Visit: Payer: 59 | Admitting: Family Medicine

## 2014-11-25 ENCOUNTER — Encounter: Payer: Self-pay | Admitting: *Deleted

## 2014-11-26 ENCOUNTER — Ambulatory Visit: Payer: 59 | Admitting: Family Medicine

## 2014-11-26 ENCOUNTER — Encounter: Payer: Self-pay | Admitting: *Deleted

## 2014-11-29 ENCOUNTER — Other Ambulatory Visit: Payer: Self-pay | Admitting: Family Medicine

## 2014-12-18 ENCOUNTER — Other Ambulatory Visit: Payer: Self-pay | Admitting: Family Medicine

## 2015-01-01 ENCOUNTER — Other Ambulatory Visit: Payer: Self-pay | Admitting: Family Medicine

## 2015-01-07 ENCOUNTER — Ambulatory Visit (INDEPENDENT_AMBULATORY_CARE_PROVIDER_SITE_OTHER): Payer: 59 | Admitting: Family Medicine

## 2015-01-07 ENCOUNTER — Encounter: Payer: Self-pay | Admitting: Family Medicine

## 2015-01-07 VITALS — BP 130/80 | HR 65 | Resp 16 | Ht 70.5 in | Wt 210.0 lb

## 2015-01-07 DIAGNOSIS — Z23 Encounter for immunization: Secondary | ICD-10-CM

## 2015-01-07 DIAGNOSIS — Z1211 Encounter for screening for malignant neoplasm of colon: Secondary | ICD-10-CM | POA: Diagnosis not present

## 2015-01-07 DIAGNOSIS — E119 Type 2 diabetes mellitus without complications: Secondary | ICD-10-CM

## 2015-01-07 DIAGNOSIS — B351 Tinea unguium: Secondary | ICD-10-CM

## 2015-01-07 DIAGNOSIS — Z0000 Encounter for general adult medical examination without abnormal findings: Secondary | ICD-10-CM

## 2015-01-07 DIAGNOSIS — I10 Essential (primary) hypertension: Secondary | ICD-10-CM

## 2015-01-07 DIAGNOSIS — E1140 Type 2 diabetes mellitus with diabetic neuropathy, unspecified: Secondary | ICD-10-CM

## 2015-01-07 DIAGNOSIS — Z1159 Encounter for screening for other viral diseases: Secondary | ICD-10-CM

## 2015-01-07 DIAGNOSIS — Z0001 Encounter for general adult medical examination with abnormal findings: Secondary | ICD-10-CM | POA: Insufficient documentation

## 2015-01-07 LAB — COMPLETE METABOLIC PANEL WITH GFR
ALBUMIN: 4 g/dL (ref 3.6–5.1)
ALK PHOS: 98 U/L (ref 40–115)
ALT: 19 U/L (ref 9–46)
AST: 20 U/L (ref 10–35)
BILIRUBIN TOTAL: 0.5 mg/dL (ref 0.2–1.2)
BUN: 12 mg/dL (ref 7–25)
CALCIUM: 8.9 mg/dL (ref 8.6–10.3)
CO2: 20 mmol/L (ref 20–31)
Chloride: 106 mmol/L (ref 98–110)
Creat: 0.77 mg/dL (ref 0.70–1.33)
GFR, Est African American: >89 mL/min (ref >=60)
GFR, Est Non African American: >89 mL/min (ref >=60)
Glucose, Bld: 87 mg/dL (ref 65–99)
POTASSIUM: 3.9 mmol/L (ref 3.5–5.3)
SODIUM: 136 mmol/L (ref 135–146)
TOTAL PROTEIN: 6.4 g/dL (ref 6.1–8.1)

## 2015-01-07 LAB — POC HEMOCCULT BLD/STL (OFFICE/1-CARD/DIAGNOSTIC): Fecal Occult Blood, POC: NEGATIVE

## 2015-01-07 MED ORDER — CLOTRIMAZOLE-BETAMETHASONE 1-0.05 % EX CREA: 1.0000 "application " | TOPICAL_CREAM | Freq: Two times a day (BID) | CUTANEOUS | Status: DC

## 2015-01-07 MED ORDER — TERBINAFINE HCL 250 MG PO TABS: 250.0000 mg | ORAL_TABLET | Freq: Every day | ORAL | Status: DC

## 2015-01-07 NOTE — Progress Notes (Signed)
   Subjective:    Patient ID: Bryan Rodgers, male    DOB: [birth year], 54 y.o.   MRN: [id number]  HPI Patient is in for annual physical exam. No other health concerns are expressed or addressed at the visit. Recent labs, are drawn on  Day of visit and reviewed after the visit. Immunization is reviewed , and  updated .    Review of Systems See HPI     Objective:   Physical Exam  BP 130/80 mmHg  Pulse 65  Resp 16  Ht 5' 10.5" (1.791 m)  Wt 210 lb (95.255 kg)  BMI 29.70 kg/m2  SpO2 98%   Pleasant well nourished male, alert and oriented x 3, in no cardio-pulmonary distress. Afebrile. HEENT No facial trauma or asymetry. Sinuses non tender. EOMI, PERTL, . External ears normal, tympanic membranes clear. Oropharynx moist, no exudate,fairly  good dentition. Neck: supple, no adenopathy,JVD or thyromegaly.No bruits.  Chest: Clear to ascultation bilaterally.No crackles or wheezes. Non tender to palpation  Breast: No asymetry,no masses. No nipple discharge or inversion. No axillary or supraclavicular adenopathy  Cardiovascular system; Heart sounds normal,  S1 and  S2 ,no S3.  No murmur, or thrill. Apical beat not displaced Peripheral pulses normal.  Abdomen: Soft, non tender, no organomegaly or masses. No bruits. Bowel sounds normal. No guarding, tenderness or rebound.  Rectal:  Normal sphincter tone. No hemorrhoids or  masses. guaiac negative stool. Prostate smooth and firm  GU: Not done  Musculoskeletal exam: Full ROM of spine, hips , shoulders and knees. No deformity ,swelling or crepitus noted. No muscle wasting or atrophy.   Neurologic: Cranial nerves 2 to 12 intact. Power, tone ,sensation and reflexes normal throughout. No disturbance in gait. No tremor.  Skin: Intact, . Pigmentation normal throughout Severe bilateral onychomycosis and tinea pedis. Mild truncal fungal infection  Psych; Normal mood and affect. Judgement and concentration  normal        Assessment & Plan:  Annual physical exam Annual exam as documented. Counseling done  re healthy lifestyle involving commitment to 150 minutes exercise per week, heart healthy diet, and attaining healthy weight.The importance of adequate sleep also discussed. Regular seat belt use and home safety, is also discussed. Changes in health habits are decided on by the patient with goals and time frames  set for achieving them. Immunization and cancer screening needs are specifically addressed at this visit.   Onychomycosis Severe disease bilaterally, 4 month course of antifungal and topical cream for tinea  Pedis , liver function is normal

## 2015-01-07 NOTE — Patient Instructions (Signed)
F/u in 4 month, call if you need me sooner  Flu vaccine today  Medication by mouth and cream to be applied to skin prescribed  For fungal infection   Labs today  Fasting lipid, cmp and EGFR, CBC and hBA1Cuin 4 month  Please work on good  health habits so that your health will improve. 1. Commitment to daily physical activity for 30 to 60  minutes, if you are able to do this.  2. Commitment to wise food choices. Aim for half of your  food intake to be vegetable and fruit, one quarter starchy foods, and one quarter protein. Try to eat on a regular schedule  3 meals per day, snacking between meals should be limited to vegetables or fruits or small portions of nuts. 64 ounces of water per day is generally recommended, unless you have specific health conditions, like heart failure or kidney failure where you will need to limit fluid intake.  3. Commitment to sufficient and a  good quality of physical and mental rest daily, generally between 6 to 8 hours per day.  WITH PERSISTANCE AND PERSEVERANCE, THE IMPOSSIBLE , BECOMES THE NORM!  Thanks for choosing [facility], we consider it a privelige to serve you. All the best for 2017!

## 2015-01-07 NOTE — Assessment & Plan Note (Signed)

## 2015-01-08 ENCOUNTER — Other Ambulatory Visit: Payer: Self-pay

## 2015-01-08 DIAGNOSIS — M549 Dorsalgia, unspecified: Secondary | ICD-10-CM

## 2015-01-08 LAB — HEPATITIS C ANTIBODY: HCV AB: NEGATIVE

## 2015-01-08 LAB — HEMOGLOBIN A1C
Hgb A1c MFr Bld: 6.2 % — ABNORMAL HIGH (ref <5.7)
Mean Plasma Glucose: 131 mg/dL — ABNORMAL HIGH (ref <117)

## 2015-01-08 MED ORDER — TRAMADOL HCL 50 MG PO TABS: ORAL_TABLET | ORAL | Status: DC

## 2015-01-11 NOTE — Assessment & Plan Note (Signed)
Severe disease bilaterally, 4 month course of antifungal and topical cream for tinea  Pedis , liver function is normal

## 2015-02-27 ENCOUNTER — Other Ambulatory Visit: Payer: Self-pay | Admitting: Family Medicine

## 2015-03-18 ENCOUNTER — Other Ambulatory Visit: Payer: Self-pay | Admitting: Family Medicine

## 2015-03-24 ENCOUNTER — Other Ambulatory Visit: Payer: Self-pay | Admitting: Family Medicine

## 2015-05-07 ENCOUNTER — Emergency Department (HOSPITAL_COMMUNITY): Payer: 59

## 2015-05-07 ENCOUNTER — Observation Stay (HOSPITAL_COMMUNITY)
Admission: EM | Admit: 2015-05-07 | Discharge: 2015-05-09 | Disposition: A | Discharge status: Home / Self Care | Payer: 59 | Attending: Internal Medicine | Admitting: Internal Medicine

## 2015-05-07 ENCOUNTER — Encounter (HOSPITAL_COMMUNITY): Payer: Self-pay | Admitting: Emergency Medicine

## 2015-05-07 DIAGNOSIS — Z7982 Long term (current) use of aspirin: Secondary | ICD-10-CM | POA: Diagnosis not present

## 2015-05-07 DIAGNOSIS — J159 Unspecified bacterial pneumonia: Secondary | ICD-10-CM | POA: Diagnosis present

## 2015-05-07 DIAGNOSIS — I10 Essential (primary) hypertension: Secondary | ICD-10-CM | POA: Insufficient documentation

## 2015-05-07 DIAGNOSIS — J189 Pneumonia, unspecified organism: Principal | ICD-10-CM | POA: Insufficient documentation

## 2015-05-07 DIAGNOSIS — Z79899 Other long term (current) drug therapy: Secondary | ICD-10-CM | POA: Insufficient documentation

## 2015-05-07 DIAGNOSIS — E1140 Type 2 diabetes mellitus with diabetic neuropathy, unspecified: Secondary | ICD-10-CM | POA: Diagnosis not present

## 2015-05-07 DIAGNOSIS — R05 Cough: Secondary | ICD-10-CM | POA: Diagnosis present

## 2015-05-07 DIAGNOSIS — A419 Sepsis, unspecified organism: Secondary | ICD-10-CM | POA: Diagnosis present

## 2015-05-07 DIAGNOSIS — Z87891 Personal history of nicotine dependence: Secondary | ICD-10-CM | POA: Diagnosis not present

## 2015-05-07 LAB — BASIC METABOLIC PANEL
ANION GAP: 9 (ref 5–15)
BUN: 12 mg/dL (ref 6–20)
CALCIUM: 9.2 mg/dL (ref 8.9–10.3)
CO2: 22 mmol/L (ref 22–32)
CREATININE: 0.85 mg/dL (ref 0.61–1.24)
Chloride: 104 mmol/L (ref 101–111)
GFR calc Af Amer: >60 mL/min (ref >60)
GLUCOSE: 90 mg/dL (ref 65–99)
Potassium: 3.6 mmol/L (ref 3.5–5.1)
Sodium: 135 mmol/L (ref 135–145)

## 2015-05-07 LAB — CBC WITH DIFFERENTIAL/PLATELET
BASOS PCT: 0 %
Basophils Absolute: 0 10*3/uL (ref 0.0–0.1)
EOS ABS: 0 10*3/uL (ref 0.0–0.7)
EOS PCT: 0 %
HCT: 41.7 % (ref 39.0–52.0)
Hemoglobin: 14.1 g/dL (ref 13.0–17.0)
LYMPHS ABS: 2.4 10*3/uL (ref 0.7–4.0)
Lymphocytes Relative: 17 %
MCH: 29.3 pg (ref 26.0–34.0)
MCHC: 33.8 g/dL (ref 30.0–36.0)
MCV: 86.7 fL (ref 78.0–100.0)
MONOS PCT: 5 %
Monocytes Absolute: 0.7 10*3/uL (ref 0.1–1.0)
NEUTROS PCT: 78 %
Neutro Abs: 11 10*3/uL — ABNORMAL HIGH (ref 1.7–7.7)
PLATELETS: 158 10*3/uL (ref 150–400)
RBC: 4.81 MIL/uL (ref 4.22–5.81)
RDW: 13.8 % (ref 11.5–15.5)
WBC: 14.1 10*3/uL — AB (ref 4.0–10.5)

## 2015-05-07 LAB — PROCALCITONIN: PROCALCITONIN: 0.45 ng/mL

## 2015-05-07 LAB — INFLUENZA PANEL BY PCR (TYPE A & B)
H1N1FLUPCR: NOT DETECTED
INFLAPCR: NEGATIVE
INFLBPCR: NEGATIVE

## 2015-05-07 LAB — LACTIC ACID, PLASMA: Lactic Acid, Venous: 0.9 mmol/L (ref 0.5–2.0)

## 2015-05-07 MED ORDER — GUAIFENESIN-DM 100-10 MG/5ML PO SYRP: 5.0000 mL | ORAL_SOLUTION | ORAL | Status: DC | PRN

## 2015-05-07 MED ORDER — IPRATROPIUM-ALBUTEROL 0.5-2.5 (3) MG/3ML IN SOLN
3.0000 mL | RESPIRATORY_TRACT | Status: DC | PRN
  Administered 2015-05-09: 3 mL via RESPIRATORY_TRACT
  Filled 2015-05-07: qty 3

## 2015-05-07 MED ORDER — RIZATRIPTAN BENZOATE 10 MG PO TBDP: 10.0000 mg | ORAL_TABLET | Freq: Once | ORAL | Status: DC | PRN

## 2015-05-07 MED ORDER — METOPROLOL SUCCINATE ER 25 MG PO TB24
25.0000 mg | ORAL_TABLET | Freq: Every day | ORAL | Status: DC
Start: 2015-05-08 — End: 2015-05-09
  Administered 2015-05-08 – 2015-05-09 (×2): 25 mg via ORAL
  Filled 2015-05-07 (×3): qty 1

## 2015-05-07 MED ORDER — HYDRALAZINE HCL 20 MG/ML IJ SOLN: 10.0000 mg | INTRAMUSCULAR | Status: DC | PRN

## 2015-05-07 MED ORDER — LEVOFLOXACIN IN D5W 500 MG/100ML IV SOLN
500.0000 mg | Freq: Once | INTRAVENOUS | Status: AC
  Administered 2015-05-07: 500 mg via INTRAVENOUS
  Filled 2015-05-07: qty 100

## 2015-05-07 MED ORDER — PANTOPRAZOLE SODIUM 40 MG PO TBEC
40.0000 mg | DELAYED_RELEASE_TABLET | Freq: Every day | ORAL | Status: DC
  Administered 2015-05-08 – 2015-05-09 (×2): 40 mg via ORAL
  Filled 2015-05-07 (×2): qty 1

## 2015-05-07 MED ORDER — DEXTROSE 5 % IV SOLN
500.0000 mg | INTRAVENOUS | Status: DC
  Administered 2015-05-08 (×2): 500 mg via INTRAVENOUS
  Filled 2015-05-07 (×4): qty 500

## 2015-05-07 MED ORDER — ASPIRIN EC 81 MG PO TBEC
81.0000 mg | DELAYED_RELEASE_TABLET | Freq: Every day | ORAL | Status: DC
  Administered 2015-05-08 (×2): 81 mg via ORAL
  Filled 2015-05-07 (×2): qty 1

## 2015-05-07 MED ORDER — POTASSIUM CHLORIDE IN NACL 20-0.9 MEQ/L-% IV SOLN
INTRAVENOUS | Status: AC
  Administered 2015-05-08: 02:00:00 via INTRAVENOUS

## 2015-05-07 MED ORDER — SODIUM CHLORIDE 0.9 % IV BOLUS (SEPSIS)
1000.0000 mL | Freq: Once | INTRAVENOUS | Status: AC
  Administered 2015-05-07: 1000 mL via INTRAVENOUS

## 2015-05-07 MED ORDER — CHLORTHALIDONE 25 MG PO TABS
25.0000 mg | ORAL_TABLET | Freq: Every day | ORAL | Status: DC
  Administered 2015-05-08: 25 mg via ORAL
  Filled 2015-05-07 (×4): qty 1

## 2015-05-07 MED ORDER — ASPIRIN-ACETAMINOPHEN-CAFFEINE 250-250-65 MG PO TABS
2.0000 | ORAL_TABLET | Freq: Three times a day (TID) | ORAL | Status: DC | PRN
  Filled 2015-05-07: qty 2

## 2015-05-07 MED ORDER — TRAMADOL HCL 50 MG PO TABS
50.0000 mg | ORAL_TABLET | Freq: Four times a day (QID) | ORAL | Status: DC | PRN
  Administered 2015-05-08 – 2015-05-09 (×3): 50 mg via ORAL
  Filled 2015-05-07 (×3): qty 1

## 2015-05-07 MED ORDER — SUMATRIPTAN SUCCINATE 50 MG PO TABS
100.0000 mg | ORAL_TABLET | Freq: Once | ORAL | Status: AC | PRN
  Administered 2015-05-09: 100 mg via ORAL
  Filled 2015-05-07: qty 2

## 2015-05-07 MED ORDER — OXYCODONE-ACETAMINOPHEN 5-325 MG PO TABS
1.0000 | ORAL_TABLET | Freq: Once | ORAL | Status: AC
  Administered 2015-05-07: 1 via ORAL
  Filled 2015-05-07: qty 1

## 2015-05-07 MED ORDER — ENOXAPARIN SODIUM 40 MG/0.4ML ~~LOC~~ SOLN
40.0000 mg | SUBCUTANEOUS | Status: DC
  Administered 2015-05-08 (×2): 40 mg via SUBCUTANEOUS
  Filled 2015-05-07 (×2): qty 0.4

## 2015-05-07 MED ORDER — OSELTAMIVIR PHOSPHATE 75 MG PO CAPS
75.0000 mg | ORAL_CAPSULE | Freq: Once | ORAL | Status: AC
  Administered 2015-05-07: 75 mg via ORAL
  Filled 2015-05-07: qty 1

## 2015-05-07 MED ORDER — DEXTROSE 5 % IV SOLN
1.0000 g | INTRAVENOUS | Status: DC
  Administered 2015-05-07 – 2015-05-08 (×2): 1 g via INTRAVENOUS
  Filled 2015-05-07 (×4): qty 10

## 2015-05-07 MED ORDER — NORTRIPTYLINE HCL 25 MG PO CAPS
25.0000 mg | ORAL_CAPSULE | Freq: Every day | ORAL | Status: DC
  Administered 2015-05-08 (×2): 25 mg via ORAL
  Filled 2015-05-07 (×3): qty 1

## 2015-05-07 NOTE — ED Notes (Signed)
Patient complaining of fever, cough, headache, cold chills since 1330 today.

## 2015-05-07 NOTE — ED Notes (Signed)
Patient placed on 2 LPM of o2 r/t sats 92%. SAts increased to 96%. NAD noted.

## 2015-05-07 NOTE — ED Provider Notes (Signed)
CSN: [id number]     Arrival date & time 05/07/15  1947 History   First MD Initiated Contact with Patient 05/07/15 2016     Chief Complaint  Patient presents with  . Cough  . Headache  . Chills     (Consider location/radiation/quality/duration/timing/severity/associated sxs/prior Treatment) Patient is a 55 y.o. male presenting with cough and headaches. The history is provided by the patient (The patient complains of fever cough and shortness of breath).  Cough Cough characteristics:  Non-productive Severity:  Moderate Onset quality:  Sudden Timing:  Constant Progression:  Worsening Chronicity:  New Associated symptoms: headaches and shortness of breath   Associated symptoms: no chest pain, no eye discharge and no rash   Headache Associated symptoms: cough   Associated symptoms: no abdominal pain, no back pain, no congestion, no diarrhea, no fatigue, no seizures and no sinus pressure     Past Medical History  Diagnosis Date  . Chest pain     Palpations  . Elevated CPK   . Tobacco abuse   . Overweight (BMI 25.0-29.9)   . Hemorrhoids     with bleeding   . History of colonic polyps     removed via colonscope  . Hypertension   . Prediabetes 10/2012    HBA1C is 6.4  . Alcoholic ([place]) quit [id number]    10-15 beers per day on the weekends    Past Surgical History  Procedure Laterality Date  . Hemmorrhoidectomy  2002  . Cataract extraction w/ intraocular lens implant Bilateral 2012   Family History  Problem Relation Age of Onset  . Cancer Mother   . Cancer Sister   . Diabetes Sister   . Kidney disease Brother    Social History  Substance Use Topics  . Smoking status: Former Smoker    Quit date: 03/16/2012  . Smokeless tobacco: Never Used  . Alcohol Use: No     Comment: Primarily Beer on the weekends, Sometimes in sizable quantities    Review of Systems  Constitutional: Negative for appetite change and fatigue.  HENT: Negative for congestion, ear discharge and sinus  pressure.   Eyes: Negative for discharge.  Respiratory: Positive for cough and shortness of breath.   Cardiovascular: Negative for chest pain.  Gastrointestinal: Negative for abdominal pain and diarrhea.  Genitourinary: Negative for frequency and hematuria.  Musculoskeletal: Negative for back pain.  Skin: Negative for rash.  Neurological: Positive for headaches. Negative for seizures.  Psychiatric/Behavioral: Negative for hallucinations.      Allergies  Review of patient's allergies indicates no known allergies.  Home Medications   Prior to Admission medications   Medication Sig Start Date End Date Taking? Authorizing Provider  aspirin EC 81 MG tablet Take 81 mg by mouth at bedtime.   Yes Historical Provider, MD  aspirin-acetaminophen-caffeine (EXCEDRIN EXTRA STRENGTH) [phone] MG tablet Take 2 tablets by mouth daily as needed for headache or migraine.   Yes Historical Provider, MD  chlorthalidone (HYGROTON) 25 MG tablet Take one-half tablet by  mouth daily 03/26/15  Yes Fayrene Helper, MD  dextromethorphan [facility]) 30 MG/5ML liquid Take by mouth as needed for cough.   Yes Historical Provider, MD  metoprolol succinate (TOPROL-XL) 25 MG 24 hr tablet Take 1 tablet by mouth once a day 03/18/15  Yes Fayrene Helper, MD  nortriptyline (PAMELOR) 25 MG capsule Take 1 capsule (25 mg total) by mouth at bedtime. 07/23/14  Yes Fayrene Helper, MD  omeprazole (PRILOSEC) 40 MG capsule Take 1 capsule by  mouth  daily 02/27/15  Yes Fayrene Helper, MD  rizatriptan (MAXALT-MLT) 10 MG disintegrating tablet Take 1 tablet (10 mg total) by mouth as needed for migraine. May repeat in 2 hours if needed 01/02/15  Yes Fayrene Helper, MD  terbinafine (LAMISIL) 250 MG tablet Take 1 tablet (250 mg total) by mouth daily. 01/07/15  Yes Fayrene Helper, MD  traMADol (ULTRAM) 50 MG tablet One tablet at bedtime for back pain Patient taking differently: Take 50 mg by mouth at bedtime. One tablet at  bedtime for back pain 01/08/15  Yes Fayrene Helper, MD   BP 153/72 mmHg  Pulse 107  Temp(Src) 101.6 F (38.7 C) (Oral)  Resp 18  Ht 5\' 10"  (1.778 m)  Wt 212 lb (96.163 kg)  BMI 30.42 kg/m2  SpO2 92% Physical Exam  Constitutional: He is oriented to person, place, and time. He appears well-developed.  HENT:  Head: Normocephalic.  Eyes: Conjunctivae and EOM are normal. No scleral icterus.  Neck: Neck supple. No thyromegaly present.  Cardiovascular: Normal rate and regular rhythm.  Exam reveals no gallop and no friction rub.   No murmur heard. Pulmonary/Chest: No stridor. He has no wheezes. He has rales. He exhibits no tenderness.  Abdominal: He exhibits no distension. There is no tenderness. There is no rebound.  Musculoskeletal: Normal range of motion. He exhibits no edema.  Lymphadenopathy:    He has no cervical adenopathy.  Neurological: He is oriented to person, place, and time. He exhibits normal muscle tone. Coordination normal.  Skin: No rash noted. No erythema.  Psychiatric: He has a normal mood and affect. His behavior is normal.    ED Course  Procedures (including critical care time) Labs Review Labs Reviewed  CBC WITH DIFFERENTIAL/PLATELET - Abnormal; Notable for the following:    WBC 14.1 (*)    Neutro Abs 11.0 (*)    All other components within normal limits  CULTURE, BLOOD (ROUTINE X 2)  CULTURE, BLOOD (ROUTINE X 2)  BASIC METABOLIC PANEL  INFLUENZA PANEL BY PCR (TYPE A & B, H1N1)  LACTIC ACID, PLASMA    Imaging Review Dg Chest 2 View  05/07/2015  CLINICAL DATA:  Productive cough, shortness of breath and chest tightness. Fever and body aches today. EXAM: CHEST - 2 VIEW COMPARISON:  09/27/2010 FINDINGS: Acute appearing bibasilar infiltrates are consistent with bilateral pneumonia. There may be a small amount of associated left pleural fluid. No edema identified. The heart size and mediastinal contours are normal. The bony thorax is unremarkable. IMPRESSION:  Acute bibasilar pneumonia. Electronically Signed   By: Aletta Edouard M.D.   On: 05/07/2015 20:45   I have personally reviewed and evaluated these images and lab results as part of my medical decision-making.   EKG Interpretation None      MDM   Final diagnoses:  Community acquired pneumonia    Patient with bilateral basilar pneumonia. He will be admitted started on antibiotics     Milton Ferguson, MD 05/07/15 2143

## 2015-05-07 NOTE — H&P (Signed)
[facility] History and Physical  Bryan Rodgers [id number] DOB: Feb 07, 1960 DOA: 05/07/2015  Referring physician: ED physician PCP: Bryan Nakayama, MD  Specialists: Dr. Krista Blue (neurology)   Chief Complaint:  Fever, chills, cough  HPI: Bryan Rodgers is a 55 y.o. male with PMH of hypertension, migraines, childhood asthma, and diet-controlled type 2 diabetes mellitus who presents to the ED with 1 day of fever, chills, sweats, and cough. Patient notes that he been in his usual state of fairly good health until developing mild exertional dyspnea and chest tightness on approximately 05/04/2015. He describes the symptoms as reminiscent of his asthma attacks as a child, noting that he had not experienced these symptoms in many years. 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 A cough that also developed by this time. He was evaluated by a nurse at work and given a nebulized breathing treatment. He reports some improvement in dyspnea following the breathing treatment but continued with fever greater than 102 F and general malaise. He came into the ED for further evaluation.  In ED, patient was found to afebrile to 38.7 a greasy, tachycardic in the 110s, saturating low 90s on room air, and with stable blood pressure. Chest x-ray features acute infiltrates in the bilateral bases consistent with a bacterial pneumonia. BMP is entirely within the normal limits and CBC features a leukocytosis to 14,100. Blood cultures were obtained, flu swab sent, and the patient was treated with empiric Levaquin and Tamiflu. He remained tachycardic in the emergency department but with stable blood pressure. He will be admitted for ongoing evaluation and management of apparent sepsis suspected secondary to bacterial pneumonia. .   Where does patient live?   At home     Can patient participate in ADLs?  Yes        Review of  Systems:   General: no weight change. Fever, chills, sweats, loss of appetite HEENT: no blurry vision, hearing changes or sore throat Pulm: Dyspnea, cough, wheeze CV: no chest pain or palpitations Abd: no nausea, vomiting, abdominal pain, diarrhea, or constipation GU: no dysuria, hematuria, increased urinary frequency, or urgency  Ext: no leg edema Neuro: no focal weakness, numbness, or tingling, no vision change or hearing loss Skin: no rash, no wounds MSK: No muscle spasm, no deformity, no red, hot, or swollen joint Heme: No easy bruising or bleeding Travel history: No recent long distant travel    Allergy: No Known Allergies  Past Medical History  Diagnosis Date  . Chest pain     Palpations  . Elevated CPK   . Tobacco abuse   . Overweight (BMI 25.0-29.9)   . Hemorrhoids     with bleeding   . History of colonic polyps     removed via colonscope  . Hypertension   . Prediabetes 10/2012    HBA1C is 6.4  . Alcoholic ([place]) quit [id number]    10-15 beers per day on the weekends     Past Surgical History  Procedure Laterality Date  . Hemmorrhoidectomy  2002  . Cataract extraction w/ intraocular lens implant Bilateral 2012    Social History:  reports that he quit smoking about 3 years ago. He has never used smokeless tobacco. He reports that he does not drink alcohol or use illicit drugs.  Family History:  Family History  Problem Relation Age of Onset  . Cancer Mother   . Cancer Sister   . Diabetes  Sister   . Kidney disease Brother      Prior to Admission medications   Medication Sig Start Date End Date Taking? Authorizing Provider  aspirin EC 81 MG tablet Take 81 mg by mouth at bedtime.   Yes Historical Provider, MD  aspirin-acetaminophen-caffeine (EXCEDRIN EXTRA STRENGTH) [phone] MG tablet Take 2 tablets by mouth daily as needed for headache or migraine.   Yes Historical Provider, MD  chlorthalidone (HYGROTON) 25 MG tablet Take one-half tablet by  mouth daily 03/26/15   Yes Fayrene Helper, MD  dextromethorphan [facility]) 30 MG/5ML liquid Take by mouth as needed for cough.   Yes Historical Provider, MD  metoprolol succinate (TOPROL-XL) 25 MG 24 hr tablet Take 1 tablet by mouth once a day 03/18/15  Yes Fayrene Helper, MD  nortriptyline (PAMELOR) 25 MG capsule Take 1 capsule (25 mg total) by mouth at bedtime. 07/23/14  Yes Fayrene Helper, MD  omeprazole (PRILOSEC) 40 MG capsule Take 1 capsule by mouth  daily 02/27/15  Yes Fayrene Helper, MD  rizatriptan (MAXALT-MLT) 10 MG disintegrating tablet Take 1 tablet (10 mg total) by mouth as needed for migraine. May repeat in 2 hours if needed 01/02/15  Yes Fayrene Helper, MD  terbinafine (LAMISIL) 250 MG tablet Take 1 tablet (250 mg total) by mouth daily. 01/07/15  Yes Fayrene Helper, MD  traMADol (ULTRAM) 50 MG tablet One tablet at bedtime for back pain Patient taking differently: Take 50 mg by mouth at bedtime. One tablet at bedtime for back pain 01/08/15  Yes Fayrene Helper, MD    Physical Exam: Filed Vitals:   05/07/15 1956 05/07/15 2056  BP: 176/83 153/72  Pulse: 112 107  Temp: 99.2 F (37.3 C) 101.6 F (38.7 C)  TempSrc: Oral Oral  Resp: 20 18  Height: 5\' 10"  (1.778 m)   Weight: 96.163 kg (212 lb)   SpO2: 95% 92%   General: In mild respiratory distress, dyspneic between sentences HEENT:       Eyes: PERRL, EOMI, no scleral icterus or conjunctival pallor.       ENT: No discharge from the ears or nose, no pharyngeal ulcers.        Neck: No JVD, no bruit, no appreciable mass Heme: No cervical adenopathy, no pallor Cardiac: Rate ~120 and regular, No murmurs, No gallops or rubs. Pulm: Good air movement bilaterally. Ronchi over bilateral bases and upper left lung fields; scattered wheezes on expiration. Abd: Soft, nondistended, nontender, no rebound pain or gaurding, BS present. Ext: No LE edema bilaterally. 2+DP/PT pulse bilaterally. Musculoskeletal: No gross deformity, no red, hot,  swollen joints   Skin: No rashes or wounds on exposed surfaces  Neuro: Alert, oriented X3, cranial nerves II-XII grossly intact. No focal findings Psych: Patient is not overtly psychotic, appropriate mood and affect.  Labs on Admission:  Basic Metabolic Panel:  Recent Labs Lab 05/07/15 2043  NA 135  K 3.6  CL 104  CO2 22  GLUCOSE 90  BUN 12  CREATININE 0.85  CALCIUM 9.2   Liver Function Tests: No results for input(s): AST, ALT, ALKPHOS, BILITOT, PROT, ALBUMIN in the last 168 hours. No results for input(s): LIPASE, AMYLASE in the last 168 hours. No results for input(s): AMMONIA in the last 168 hours. CBC:  Recent Labs Lab 05/07/15 2043  WBC 14.1*  NEUTROABS 11.0*  HGB 14.1  HCT 41.7  MCV 86.7  PLT 158   Cardiac Enzymes: No results for input(s): CKTOTAL, CKMB, CKMBINDEX, TROPONINI in  the last 168 hours.  BNP (last 3 results) No results for input(s): BNP in the last 8760 hours.  ProBNP (last 3 results) No results for input(s): PROBNP in the last 8760 hours.  CBG: No results for input(s): GLUCAP in the last 168 hours.  Radiological Exams on Admission: Dg Chest 2 View  05/07/2015  CLINICAL DATA:  Productive cough, shortness of breath and chest tightness. Fever and body aches today. EXAM: CHEST - 2 VIEW COMPARISON:  09/27/2010 FINDINGS: Acute appearing bibasilar infiltrates are consistent with bilateral pneumonia. There may be a small amount of associated left pleural fluid. No edema identified. The heart size and mediastinal contours are normal. The bony thorax is unremarkable. IMPRESSION: Acute bibasilar pneumonia. Electronically Signed   By: Aletta Edouard M.D.   On: 05/07/2015 20:45    EKG:  Not done in ED, will obtain as appropriate   Assessment/Plan  1. Sepsis secondary to PNA  - Meets sepsis criteria on admission with fever, leukocytosis, tachycardia, and PNA  - Lactic acid is pending; no apparent end-organ damage; BP is modestly elevated and stable in ED   - Empiric Levaquin and Tamiflu given in ED  - Blood cultures pending, sputum culture requested  - Check urine for antigens to strep pneumo and legionella  - Trend lactate, procalcitonin  - Treating with empiric azithromycin and Rocephin while awaiting culture data  - Will give fluid bolus with NS, continue IVF infusion overnight  - Monitor on telemetry overnight given persistent tachycardia  - Supplemental O2 prn to keep sats >92%   2. Bronchospasm  - DuoNeb q4h prn SOB or wheezing    3. Hypertension  - Modestly elevated so far here  - Takes chlorthalidone and metoprolol at home  - Plan to resume home-dose antihypertensives in the am if he is improving and BP remains stable   4. Type II DM  - Currently diet-controlled  - Most recent A1c on file is 6.2% from 01/2015, suggesting that he may no longer have DM  - Monitor, institute a sliding-scale insulin regimen prn     DVT ppx:  SQ Lovenox    Code Status: Full code Family Communication:   Yes, patient's wife at bed side Disposition Plan: Admit to inpatient   Date of Service 05/07/2015    Vianne Bulls, MD [facility] Pager [phone]  If 7PM-7AM, please contact night-coverage [email or web address] Password [facility] 05/07/2015, 9:44 PM

## 2015-05-08 ENCOUNTER — Encounter (HOSPITAL_COMMUNITY): Payer: Self-pay

## 2015-05-08 DIAGNOSIS — J189 Pneumonia, unspecified organism: Secondary | ICD-10-CM | POA: Diagnosis not present

## 2015-05-08 DIAGNOSIS — I10 Essential (primary) hypertension: Secondary | ICD-10-CM

## 2015-05-08 DIAGNOSIS — E1140 Type 2 diabetes mellitus with diabetic neuropathy, unspecified: Secondary | ICD-10-CM | POA: Diagnosis not present

## 2015-05-08 DIAGNOSIS — J159 Unspecified bacterial pneumonia: Secondary | ICD-10-CM | POA: Diagnosis not present

## 2015-05-08 LAB — BASIC METABOLIC PANEL
ANION GAP: 9 (ref 5–15)
BUN: 12 mg/dL (ref 6–20)
CALCIUM: 8.4 mg/dL — AB (ref 8.9–10.3)
CO2: 22 mmol/L (ref 22–32)
CREATININE: 0.85 mg/dL (ref 0.61–1.24)
Chloride: 107 mmol/L (ref 101–111)
Glucose, Bld: 93 mg/dL (ref 65–99)
Potassium: 3.6 mmol/L (ref 3.5–5.1)
SODIUM: 138 mmol/L (ref 135–145)

## 2015-05-08 LAB — STREP PNEUMONIAE URINARY ANTIGEN: STREP PNEUMO URINARY ANTIGEN: NEGATIVE

## 2015-05-08 LAB — GLUCOSE, CAPILLARY: GLUCOSE-CAPILLARY: 84 mg/dL (ref 65–99)

## 2015-05-08 MED ORDER — ACETAMINOPHEN 500 MG PO TABS
500.0000 mg | ORAL_TABLET | Freq: Three times a day (TID) | ORAL | Status: DC | PRN
Start: 2015-05-08 — End: 2015-05-09

## 2015-05-08 MED ORDER — GUAIFENESIN ER 600 MG PO TB12
1200.0000 mg | ORAL_TABLET | Freq: Two times a day (BID) | ORAL | Status: DC
  Administered 2015-05-08 – 2015-05-09 (×3): 1200 mg via ORAL
  Filled 2015-05-08 (×3): qty 2

## 2015-05-08 MED ORDER — ASPIRIN 325 MG PO TABS: 650.0000 mg | ORAL_TABLET | Freq: Three times a day (TID) | ORAL | Status: DC | PRN

## 2015-05-08 MED ORDER — ZOLPIDEM TARTRATE 5 MG PO TABS
5.0000 mg | ORAL_TABLET | Freq: Every evening | ORAL | Status: DC | PRN
  Administered 2015-05-08: 5 mg via ORAL
  Filled 2015-05-08: qty 1

## 2015-05-08 MED ORDER — ACETAMINOPHEN 325 MG PO TABS: 650.0000 mg | ORAL_TABLET | Freq: Three times a day (TID) | ORAL | Status: DC | PRN

## 2015-05-08 NOTE — Progress Notes (Signed)
[facility] PROGRESS NOTE  Bryan Rodgers [id number] DOB: [birth year] DOA: 05/07/2015 PCP: Tula Nakayama, MD  Assessment/Plan: 1. CAP. WBC 14.1 Lactic acid 0.9, continue to trend. Blood and sputum Cx results pending. Currently on Rocephin and Azithromycin. Continue abx, breathing tx, and IVFs. Continue O2 and wean as tolerated. 2. Bronchospasm. likely related to #1. Continue nebs PRN 3. HTN. Continue home meds 4. DM type 2. Stable. A1c from 01/2015 showed 6.2%, suggesting good control. Continue SSI PRN 5. GERD. Continue PPI  Code Status: Full DVT prophylaxis: Lovenox Family Communication: Discussed with patient Disposition Plan: discharge home once improved   Consultants:  none  Procedures:  none  Antibiotics:  Levaquin 4/6 >> 4/6  Azythromycin 4/6 >>  Rocephin 4/6 >>  HPI/Subjective: Feels a lot better than he was. Is having production with cough and some tightness in chest. SOB is better and only mildly wheezing. Denies needing an inhaler since he was a child.  Objective: Filed Vitals:   05/08/15 0100 05/08/15 0522  BP: 147/77 119/72  Pulse: 92 83  Temp: 98.3 F (36.8 C) 98.1 F (36.7 C)  Resp: 24 22    Intake/Output Summary (Last 24 hours) at 05/08/15 0728 Last data filed at 05/08/15 0552  Gross per 24 hour  Intake 883.33 ml  Output   1400 ml  Net -516.67 ml   Filed Weights   05/07/15 1956 05/08/15 0522  Weight: 96.163 kg (212 lb) 92.4 kg (203 lb 11.3 oz)    Exam:  General: NAD, looks comfortable Cardiovascular: RRR, S1, S2  Respiratory:  rhonchi bilaterally Abdomen: soft, non tender, no distention , bowel sounds normal Musculoskeletal: No edema b/l  Data Reviewed: Basic Metabolic Panel:  Recent Labs Lab 05/07/15 2043 05/08/15 0450  NA 135 138  K 3.6 3.6  CL 104 107  CO2 22 22  GLUCOSE 90 93  BUN 12 12  CREATININE 0.85 0.85  CALCIUM 9.2 8.4*   Liver Function Tests: No results for input(s): AST, ALT, ALKPHOS, BILITOT,  PROT, ALBUMIN in the last 168 hours. No results for input(s): LIPASE, AMYLASE in the last 168 hours. No results for input(s): AMMONIA in the last 168 hours. CBC:  Recent Labs Lab 05/07/15 2043  WBC 14.1*  NEUTROABS 11.0*  HGB 14.1  HCT 41.7  MCV 86.7  PLT 158   Cardiac Enzymes: No results for input(s): CKTOTAL, CKMB, CKMBINDEX, TROPONINI in the last 168 hours. BNP (last 3 results) No results for input(s): BNP in the last 8760 hours.  ProBNP (last 3 results) No results for input(s): PROBNP in the last 8760 hours.  CBG: No results for input(s): GLUCAP in the last 168 hours.  Recent Results (from the past 240 hour(s))  Blood culture (routine x 2)     Status: None (Preliminary result)   Collection Time: 05/07/15  8:43 PM  Result Value Ref Range Status   Specimen Description RIGHT ANTECUBITAL  Final   Special Requests BOTTLES DRAWN AEROBIC AND ANAEROBIC 8CC  Final   Culture PENDING  Incomplete   Report Status PENDING  Incomplete  Blood culture (routine x 2)     Status: None (Preliminary result)   Collection Time: 05/07/15  9:03 PM  Result Value Ref Range Status   Specimen Description RIGHT ANTECUBITAL  Final   Special Requests BOTTLES DRAWN AEROBIC AND ANAEROBIC 8CC  Final   Culture PENDING  Incomplete   Report Status PENDING  Incomplete     Studies: Dg Chest 2 View  05/07/2015  CLINICAL DATA:  Productive cough, shortness of breath and chest tightness. Fever and body aches today. EXAM: CHEST - 2 VIEW COMPARISON:  09/27/2010 FINDINGS: Acute appearing bibasilar infiltrates are consistent with bilateral pneumonia. There may be a small amount of associated left pleural fluid. No edema identified. The heart size and mediastinal contours are normal. The bony thorax is unremarkable. IMPRESSION: Acute bibasilar pneumonia. Electronically Signed   By: Aletta Edouard M.D.   On: 05/07/2015 20:45    Scheduled Meds: . aspirin EC  81 mg Oral QHS  . azithromycin  500 mg Intravenous Q24H   . cefTRIAXone (ROCEPHIN)  IV  1 g Intravenous Q24H  . chlorthalidone  25 mg Oral Daily  . enoxaparin (LOVENOX) injection  40 mg Subcutaneous Q24H  . metoprolol succinate  25 mg Oral Daily  . nortriptyline  25 mg Oral QHS  . pantoprazole  40 mg Oral Daily   Continuous Infusions: . 0.9 % NaCl with KCl 20 mEq / L 100 mL/hr at 05/08/15 0134    Principal Problem:   Sepsis, unspecified organism ([place]) Active Problems:   HTN (hypertension)   Type 2 diabetes, controlled, with neuropathy ([place])   Sepsis ([place])   Pneumonia, bacterial   Pneumonia    Time spent: 25 minutes    Kathie Dike, MD.  [facility] Pager [phone]. If 7PM-7AM, please contact night-coverage at [email or web address], password [facility] 05/08/2015, 7:28 AM     By signing my name below, I, Delene Ruffini, attest that this documentation has been prepared under the direction and in the presence of Kathie Dike, MD. Electronically Signed: Delene Ruffini 05/08/2015 1:10pm  I, Dr. Kathie Dike, personally performed the services described in this documentaiton. All medical record entries made by the scribe were at my direction and in my presence. I have reviewed the chart and agree that the record reflects my personal performance and is accurate and complete  Kathie Dike, MD, 05/08/2015 1:24 PM

## 2015-05-09 DIAGNOSIS — J159 Unspecified bacterial pneumonia: Secondary | ICD-10-CM | POA: Diagnosis not present

## 2015-05-09 DIAGNOSIS — E1140 Type 2 diabetes mellitus with diabetic neuropathy, unspecified: Secondary | ICD-10-CM | POA: Diagnosis not present

## 2015-05-09 DIAGNOSIS — I10 Essential (primary) hypertension: Secondary | ICD-10-CM | POA: Diagnosis not present

## 2015-05-09 LAB — GLUCOSE, CAPILLARY: GLUCOSE-CAPILLARY: 109 mg/dL — AB (ref 65–99)

## 2015-05-09 LAB — LEGIONELLA PNEUMOPHILA SEROGP 1 UR AG: L. pneumophila Serogp 1 Ur Ag: NEGATIVE

## 2015-05-09 LAB — CBC
HEMATOCRIT: 42.3 % (ref 39.0–52.0)
Hemoglobin: 14.4 g/dL (ref 13.0–17.0)
MCH: 30 pg (ref 26.0–34.0)
MCHC: 34 g/dL (ref 30.0–36.0)
MCV: 88.1 fL (ref 78.0–100.0)
Platelets: 197 10*3/uL (ref 150–400)
RBC: 4.8 MIL/uL (ref 4.22–5.81)
RDW: 14.1 % (ref 11.5–15.5)
WBC: 8.9 10*3/uL (ref 4.0–10.5)

## 2015-05-09 LAB — EXPECTORATED SPUTUM ASSESSMENT W REFEX TO RESP CULTURE

## 2015-05-09 LAB — PROCALCITONIN: PROCALCITONIN: 0.72 ng/mL

## 2015-05-09 LAB — EXPECTORATED SPUTUM ASSESSMENT W GRAM STAIN, RFLX TO RESP C: Special Requests: NORMAL

## 2015-05-09 LAB — HIV ANTIBODY (ROUTINE TESTING W REFLEX): HIV SCREEN 4TH GENERATION: NONREACTIVE

## 2015-05-09 MED ORDER — ALBUTEROL SULFATE HFA 108 (90 BASE) MCG/ACT IN AERS: 2.0000 | INHALATION_SPRAY | Freq: Four times a day (QID) | RESPIRATORY_TRACT | Status: DC | PRN

## 2015-05-09 MED ORDER — GUAIFENESIN ER 600 MG PO TB12: 600.0000 mg | ORAL_TABLET | Freq: Two times a day (BID) | ORAL | Status: DC

## 2015-05-09 MED ORDER — AMOXICILLIN-POT CLAVULANATE 875-125 MG PO TABS: 1.0000 | ORAL_TABLET | Freq: Two times a day (BID) | ORAL | Status: DC

## 2015-05-09 MED ORDER — SUMATRIPTAN SUCCINATE 50 MG PO TABS: 100.0000 mg | ORAL_TABLET | ORAL | Status: DC | PRN

## 2015-05-09 NOTE — Discharge Summary (Signed)
Physician Discharge Summary  Bryan Rodgers [id number] DOB: 1961-01-12 DOA: 05/07/2015  PCP: Tula Nakayama, MD  Admit date: 05/07/2015 Discharge date: 05/09/2015  Time spent: 35 minutes  Recommendations for Outpatient Follow-up:  1. Follow up for repeat CXR in 4 weeks 2. Follow up with PCP in 1-2 weeks 3. Continue 5 day regimen of abx as outpatient   Discharge Diagnoses:  Active Problems:   HTN (hypertension)   Type 2 diabetes, controlled, with neuropathy ([place])   Pneumonia, bacterial   Pneumonia   Discharge Condition: improved  Diet recommendation: carb modified  Filed Weights   05/07/15 1956 05/08/15 0522  Weight: 96.163 kg (212 lb) 92.4 kg (203 lb 11.3 oz)    History of present illness:  70 yom with a hx of HTN, childhood asthma, DM type 2 presented to the ED with 1 day of  Fever, chills, diaphoresis, and cough. He had been in his usual state of failry good health until he developed chest tightness and mild exertional dyspnea on approximately 4/3. He described his Sz as similar to his asthma attacks from childhood. In the early afternoon of 4/6, he developed fevers, chills, cough, and sweats. 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 While in the ED, he was found to be afebrile, tachycardic, and with stable BP. CXR showed acute infiltrates and leukocytosis. He was admitted for evaluation and management of apparent sepsis secondary to bacterial PNA.  Hospital Course:  The patient was evaluated in the ED and was found to have CAP as revealed by a CXR that showed acute infiltrates consistent with PNA. Blood Cx were obtained and the patient was started on abx. Flu swab was negative. He was also treated with IVFs, breathing treatments, and given supplemental O2. After several days of tx, the patient appeared to be much improved clinically and was ready to discharge home.  He does not require any supplemental O2 and is breathing comfortably on RA. He has been afebrile for 48 hrs. It is recommended that he follow up with his PP in 1-2 weeks and have repeat CXR in 4 weeks.  1. Bronchospasm. likely related to #1. Continue bronchidilators PRN 2. HTN. Continue home meds 3. DM type 2. Stable. A1c from 01/2015 showed 6.2%, suggesting good control. 4. GERD. Continue PPI  Procedures:  none  Consultations:  None  Discharge Exam: Filed Vitals:   05/08/15 2121 05/09/15 0656  BP: 130/67 131/76  Pulse: 76 80  Temp: 98.1 F (36.7 C) 98.5 F (36.9 C)  Resp: 20 20     General: NAD, looks comfortable  Cardiovascular: RRR, S1, S2   Respiratory: occasional rhonchi bilaterally  Abdomen: soft, non tender, no distention , bowel sounds normal  Musculoskeletal: No edema b/l   Discharge Instructions   Discharge Instructions    Diet - low sodium heart healthy    Complete by:  As directed      Increase activity slowly    Complete by:  As directed           Current Discharge Medication List    START taking these medications   Details  albuterol (PROVENTIL HFA;VENTOLIN HFA) 108 (90 Base) MCG/ACT inhaler Inhale 2 puffs into the lungs every 6 (six) hours as needed for wheezing or shortness of breath. Qty: 1 Inhaler, Refills: 2    amoxicillin-clavulanate (AUGMENTIN) 875-125 MG tablet Take 1 tablet by mouth  2 (two) times daily. Qty: 10 tablet, Refills: 0    guaiFENesin (MUCINEX) 600 MG 12 hr tablet Take 1 tablet (600 mg total) by mouth 2 (two) times daily. Qty: 30 tablet, Refills: 0      CONTINUE these medications which have NOT CHANGED   Details  aspirin EC 81 MG tablet Take 81 mg by mouth at bedtime.    aspirin-acetaminophen-caffeine (EXCEDRIN EXTRA STRENGTH) 250-250-65 MG tablet Take 2 tablets by mouth daily as needed for headache or migraine.    chlorthalidone (HYGROTON) 25 MG tablet Take one-half tablet by  mouth daily Qty: 45 tablet, Refills:  1    dextromethorphan (DELSYM) 30 MG/5ML liquid Take by mouth as needed for cough.    metoprolol succinate (TOPROL-XL) 25 MG 24 hr tablet Take 1 tablet by mouth once a day Qty: 90 tablet, Refills: 1    nortriptyline (PAMELOR) 25 MG capsule Take 1 capsule (25 mg total) by mouth at bedtime. Qty: 30 capsule, Refills: 5   Associated Diagnoses: Nonintractable migraine, unspecified migraine type    omeprazole (PRILOSEC) 40 MG capsule Take 1 capsule by mouth  daily Qty: 90 capsule, Refills: 0    rizatriptan (MAXALT-MLT) 10 MG disintegrating tablet Take 1 tablet (10 mg total) by mouth as needed for migraine. May repeat in 2 hours if needed Qty: 4 tablet, Refills: 0    terbinafine (LAMISIL) 250 MG tablet Take 1 tablet (250 mg total) by mouth daily. Qty: 30 tablet, Refills: 3   Associated Diagnoses: Onychomycosis    traMADol (ULTRAM) 50 MG tablet One tablet at bedtime for back pain Qty: 30 tablet, Refills: 3   Associated Diagnoses: Back pain with radiation       No Known Allergies    The results of significant diagnostics from this hospitalization (including imaging, microbiology, ancillary and laboratory) are listed below for reference.    Significant Diagnostic Studies: Dg Chest 2 View  05/07/2015  CLINICAL DATA:  Productive cough, shortness of breath and chest tightness. Fever and body aches today. EXAM: CHEST - 2 VIEW COMPARISON:  09/27/2010 FINDINGS: Acute appearing bibasilar infiltrates are consistent with bilateral pneumonia. There may be a small amount of associated left pleural fluid. No edema identified. The heart size and mediastinal contours are normal. The bony thorax is unremarkable. IMPRESSION: Acute bibasilar pneumonia. Electronically Signed   By: Aletta Edouard M.D.   On: 05/07/2015 20:45    Microbiology: Recent Results (from the past 240 hour(s))  Blood culture (routine x 2)     Status: None (Preliminary result)   Collection Time: 05/07/15  8:43 PM  Result Value Ref  Range Status   Specimen Description RIGHT ANTECUBITAL  Final   Special Requests BOTTLES DRAWN AEROBIC AND ANAEROBIC 8CC  Final   Culture NO GROWTH < 12 HOURS  Final   Report Status PENDING  Incomplete  Blood culture (routine x 2)     Status: None (Preliminary result)   Collection Time: 05/07/15  9:03 PM  Result Value Ref Range Status   Specimen Description RIGHT ANTECUBITAL  Final   Special Requests BOTTLES DRAWN AEROBIC AND ANAEROBIC 8CC  Final   Culture NO GROWTH < 12 HOURS  Final   Report Status PENDING  Incomplete  Culture, sputum-assessment     Status: None   Collection Time: 05/08/15  5:40 AM  Result Value Ref Range Status   Specimen Description SPUTUM  Final   Special Requests Normal  Final   Sputum evaluation   Final    THIS SPECIMEN  IS ACCEPTABLE. RESPIRATORY CULTURE REPORT TO FOLLOW.   Report Status 05/09/2015 FINAL  Final  Culture, respiratory (NON-Expectorated)     Status: None (Preliminary result)   Collection Time: 05/08/15  5:40 AM  Result Value Ref Range Status   Specimen Description SPUTUM  Final   Special Requests NONE  Final   Gram Stain   Final    ABUNDANT WBC PRESENT,BOTH PMN AND MONONUCLEAR FEW SQUAMOUS EPITHELIAL CELLS PRESENT MODERATE GRAM POSITIVE COCCI IN PAIRS FEW GRAM NEGATIVE RODS FEW GRAM POSITIVE RODS THIS SPECIMEN IS ACCEPTABLE FOR SPUTUM CULTURE Performed at [facility]    Culture PENDING  Incomplete   Report Status PENDING  Incomplete     Labs: Basic Metabolic Panel:  Recent Labs Lab 05/07/15 2043 05/08/15 0450  NA 135 138  K 3.6 3.6  CL 104 107  CO2 22 22  GLUCOSE 90 93  BUN 12 12  CREATININE 0.85 0.85  CALCIUM 9.2 8.4*   Liver Function Tests: No results for input(s): AST, ALT, ALKPHOS, BILITOT, PROT, ALBUMIN in the last 168 hours. No results for input(s): LIPASE, AMYLASE in the last 168 hours. No results for input(s): AMMONIA in the last 168 hours. CBC:  Recent Labs Lab 05/07/15 2043 05/09/15 0527  WBC 14.1*  8.9  NEUTROABS 11.0*  --   HGB 14.1 14.4  HCT 41.7 42.3  MCV 86.7 88.1  PLT 158 197   Cardiac Enzymes: No results for input(s): CKTOTAL, CKMB, CKMBINDEX, TROPONINI in the last 168 hours. BNP: BNP (last 3 results) No results for input(s): BNP in the last 8760 hours.  ProBNP (last 3 results) No results for input(s): PROBNP in the last 8760 hours.  CBG:  Recent Labs Lab 05/08/15 0813 05/09/15 0714  GLUCAP 84 109*       Signed:  Kathie Dike, MD. [facility] 05/09/2015, 1:01 PM   By signing my name below, I, Delene Ruffini, attest that this documentation has been prepared under the direction and in the presence of Kathie Dike, MD. Electronically Signed: Delene Ruffini 05/09/2015  12:50pm  I, Dr. Kathie Dike, personally performed the services described in this documentaiton. All medical record entries made by the scribe were at my direction and in my presence. I have reviewed the chart and agree that the record reflects my personal performance and is accurate and complete  Kathie Dike, MD, 05/09/2015 1:01 PM

## 2015-05-09 NOTE — Progress Notes (Signed)
Pt discharged home today per Dr. Memon. Pt's IV site D/C'd and WDL. Pt's VSS. Pt provided with home medication list, discharge instructions and prescriptions. Verbalized understanding. Pt left floor via WC in stable condition accompanied by RN. 

## 2015-05-09 NOTE — Progress Notes (Signed)
Pt's migraine pain reassessed and patient states that pain is now a 4/10 on pain scale.  He is interested in continuing the lavender as he feels this has made a significant difference in his pain.  Will continue to monitor.

## 2015-05-09 NOTE — Progress Notes (Addendum)
Pt c/o migraine at this time.  Currently rating pain as a 10/10 on pain scale.  Lavender oil drops were used on the temple pressure points and at the base of the neck pressure points.  Education was given regarding essential oil use, and patient and family verbalized understanding.  Will continue to monitor.

## 2015-05-11 LAB — CULTURE, RESPIRATORY W GRAM STAIN: Culture: NORMAL

## 2015-05-11 LAB — CULTURE, RESPIRATORY

## 2015-05-12 LAB — CULTURE, BLOOD (ROUTINE X 2)
CULTURE: NO GROWTH
CULTURE: NO GROWTH

## 2015-05-13 ENCOUNTER — Ambulatory Visit (INDEPENDENT_AMBULATORY_CARE_PROVIDER_SITE_OTHER): Payer: 59 | Admitting: Family Medicine

## 2015-05-13 ENCOUNTER — Encounter: Payer: Self-pay | Admitting: Family Medicine

## 2015-05-13 VITALS — BP 128/80 | HR 85 | Resp 16 | Ht 70.0 in | Wt 205.0 lb

## 2015-05-13 DIAGNOSIS — J45901 Unspecified asthma with (acute) exacerbation: Secondary | ICD-10-CM | POA: Diagnosis not present

## 2015-05-13 DIAGNOSIS — J159 Unspecified bacterial pneumonia: Secondary | ICD-10-CM

## 2015-05-13 DIAGNOSIS — E119 Type 2 diabetes mellitus without complications: Secondary | ICD-10-CM | POA: Diagnosis not present

## 2015-05-13 DIAGNOSIS — E1140 Type 2 diabetes mellitus with diabetic neuropathy, unspecified: Secondary | ICD-10-CM

## 2015-05-13 DIAGNOSIS — I10 Essential (primary) hypertension: Secondary | ICD-10-CM

## 2015-05-13 DIAGNOSIS — Z09 Encounter for follow-up examination after completed treatment for conditions other than malignant neoplasm: Secondary | ICD-10-CM

## 2015-05-13 MED ORDER — BUDESONIDE-FORMOTEROL FUMARATE 160-4.5 MCG/ACT IN AERO: 2.0000 | INHALATION_SPRAY | Freq: Two times a day (BID) | RESPIRATORY_TRACT | Status: DC

## 2015-05-13 MED ORDER — IPRATROPIUM-ALBUTEROL 0.5-2.5 (3) MG/3ML IN SOLN: 3.0000 mL | Freq: Four times a day (QID) | RESPIRATORY_TRACT | Status: DC | PRN

## 2015-05-13 MED ORDER — IPRATROPIUM BROMIDE 0.02 % IN SOLN
0.5000 mg | Freq: Once | RESPIRATORY_TRACT | Status: AC
  Administered 2015-05-13: 0.5 mg via RESPIRATORY_TRACT

## 2015-05-13 MED ORDER — PREDNISONE 5 MG (21) PO TBPK: 5.0000 mg | ORAL_TABLET | ORAL | Status: DC

## 2015-05-13 MED ORDER — METHYLPREDNISOLONE ACETATE 80 MG/ML IJ SUSP
80.0000 mg | Freq: Once | INTRAMUSCULAR | Status: AC
  Administered 2015-05-13: 80 mg via INTRAMUSCULAR

## 2015-05-13 MED ORDER — ALBUTEROL SULFATE (2.5 MG/3ML) 0.083% IN NEBU
2.5000 mg | INHALATION_SOLUTION | Freq: Once | RESPIRATORY_TRACT | Status: AC
  Administered 2015-05-13: 2.5 mg via RESPIRATORY_TRACT

## 2015-05-13 NOTE — Patient Instructions (Signed)
F/u around May 17, call if you need me sooner  Complete antibiotic course for pneumonia.  You report flare of asthma , which you state you had as a child  Breathing treatment and depo medrol in office, prednisone dose pack and Breathing machine with solution prescribed , also New inhaler to use EVERY day is symbicort  CXR 2nd week in May  You will be referred for lung function tests in the future  Work excuse from today return 05/20/2015  Thank you  for choosing [facility]. We consider it a privelige to serve you.  Delivering excellent health care in a caring and  compassionate way is our goal.  Partnering with you,  so that together we can achieve this goal is our strategy.

## 2015-05-13 NOTE — Progress Notes (Signed)
Subjective:    Patient ID: Bryan Rodgers, male    DOB: Dec 24, 1960, 55 y.o.   MRN: [id number]  HPI Pt in for hospital f/u for acute bacterial pneumonia. No more fever or chills states that having chest tightness with  Minimal activity, reports childhood asthma up until age 63 , and states that this is how he feels now. No current fever or chills, reduced sputum  Review of Systems See HPI Denies sinus pressure, nasal congestion, ear pain or sore throat.  Denies chest pains, palpitations and leg swelling Denies abdominal pain, nausea, vomiting,diarrhea or constipation.   Denies dysuria, frequency, hesitancy or incontinence. Denies joint pain, swelling and limitation in mobility. Denies headaches, seizures, numbness, or tingling. Denies depression, anxiety or insomnia. Denies skin break down or rash.        Objective:   Physical Exam  BP 128/80 mmHg  Pulse 85  Resp 16  Ht 5\' 10"  (1.778 m)  Wt 205 lb (92.987 kg)  BMI 29.41 kg/m2  SpO2 96% Patient alert and oriented and in no cardiopulmonary distress.  HEENT: No facial asymmetry, EOMI,   oropharynx pink and moist.  Neck supple no JVD, no mass.  Chest: decreased air entry, bilateral wheezing  CVS: S1, S2 no murmurs, no S3.Regular rate.  ABD: Soft non tender.   Ext: No edema  MS: Adequate ROM spine, shoulders, hips and knees.  Skin: Intact, no ulcerations or rash noted.  Psych: Good eye contact, normal affect. Memory intact not anxious or depressed appearing.  CNS: CN 2-12 intact, power,  normal throughout.no focal deficits noted.      Assessment & Plan:  Pneumonia, bacterial Improved as far as chills, fatigue and sputum production, will obtain f/u CXR in next several weeks to ensure clearing  Hospital discharge follow-up Improved as far as infection is concerned, but describes significant shortness of breath and wheezing, states he had asthnma intil he was 55 years old, report dyspnea when walking from  bedroom to bathroom, will keep out of work for and addittionl several days and treat for asthma flare.Will need PFT at some stage   Acute asthma flare Start symbicort daily, and prednisone dose pack prescribed also, neb treatment done in office also, work excuse extended to cover current acute illness, and depo medrol administered in office Script for neb machine and duoneb also prepared. PFT in future  HTN (hypertension) Controlled, no change in medication DASH diet and commitment to daily physical activity for a minimum of 30 minutes discussed and encouraged, as a part of hypertension management. The importance of attaining a healthy weight is also discussed.  BP/Weight 05/13/2015 05/09/2015 05/08/2015 01/07/2015 08/21/2014 07/23/2014 [id number]  Systolic BP 0000000 [id number] - [id number]  Diastolic BP 80 76 - 80 70 80 74  Wt. (Lbs) 205 - 203.71 210 204 203 203  BMI 29.41 - 29.23 29.7 28.46 28.71 28.71        Type 2 diabetes, controlled, with neuropathy (HCC) Controlled, no change in medication Bryan Rodgers is reminded of the importance of commitment to daily physical activity for 30 minutes or more, as able and the need to limit carbohydrate intake to 30 to 60 grams per meal to help with blood sugar control.   The need to take medication as prescribed, test blood sugar as directed, and to call between visits if there is a concern that blood sugar is uncontrolled is also discussed.   Bryan Rodgers is reminded of the importance of daily foot exam,  annual eye examination, and good blood sugar, blood pressure and cholesterol control.  Diabetic Labs Latest Ref Rng 05/08/2015 05/07/2015 01/07/2015 08/16/2014 07/23/2014  HbA1c <5.7 % - - 6.2(H) - 6.7(H)  Microalbumin <2.0 mg/dL - - - 0.9 -  Micro/Creat Ratio 0.0 - 30.0 mg/g - - - 7.4 -  Chol 0 - 200 mg/dL - - - 112 -  HDL >=40 mg/dL - - - 27(L) -  Calc LDL 0 - 99 mg/dL - - - 30 -  Triglycerides <150 mg/dL - - - 277(H) -  Creatinine 0.61 - 1.24 mg/dL 0.85  0.85 0.77 0.93 1.08   BP/Weight 05/13/2015 05/09/2015 05/08/2015 01/07/2015 08/21/2014 07/23/2014 [id number]  Systolic BP 0000000 [id number] - [id number]  Diastolic BP 80 76 - 80 70 80 74  Wt. (Lbs) 205 - 203.71 210 204 203 203  BMI 29.41 - 29.23 29.7 28.46 28.71 28.71   Foot/eye exam completion dates 01/07/2015 08/21/2014  Foot Form Completion Done Done

## 2015-05-16 DIAGNOSIS — Z09 Encounter for follow-up examination after completed treatment for conditions other than malignant neoplasm: Secondary | ICD-10-CM | POA: Insufficient documentation

## 2015-05-16 NOTE — Assessment & Plan Note (Signed)
Controlled, no change in medication DASH diet and commitment to daily physical activity for a minimum of 30 minutes discussed and encouraged, as a part of hypertension management. The importance of attaining a healthy weight is also discussed.  BP/Weight 05/13/2015 05/09/2015 05/08/2015 01/07/2015 08/21/2014 07/23/2014 [id number]  Systolic BP 0000000 [id number] - [id number]  Diastolic BP 80 76 - 80 70 80 74  Wt. (Lbs) 205 - 203.71 210 204 203 203  BMI 29.41 - 29.23 29.7 28.46 28.71 28.71

## 2015-05-16 NOTE — Assessment & Plan Note (Signed)
Controlled, no change in medication Bryan Rodgers is reminded of the importance of commitment to daily physical activity for 30 minutes or more, as able and the need to limit carbohydrate intake to 30 to 60 grams per meal to help with blood sugar control.   The need to take medication as prescribed, test blood sugar as directed, and to call between visits if there is a concern that blood sugar is uncontrolled is also discussed.   Bryan Rodgers is reminded of the importance of daily foot exam, annual eye examination, and good blood sugar, blood pressure and cholesterol control.  Diabetic Labs Latest Ref Rng 05/08/2015 05/07/2015 01/07/2015 08/16/2014 07/23/2014  HbA1c <5.7 % - - 6.2(H) - 6.7(H)  Microalbumin <2.0 mg/dL - - - 0.9 -  Micro/Creat Ratio 0.0 - 30.0 mg/g - - - 7.4 -  Chol 0 - 200 mg/dL - - - 112 -  HDL >=40 mg/dL - - - 27(L) -  Calc LDL 0 - 99 mg/dL - - - 30 -  Triglycerides <150 mg/dL - - - 277(H) -  Creatinine 0.61 - 1.24 mg/dL 0.85 0.85 0.77 0.93 1.08   BP/Weight 05/13/2015 05/09/2015 05/08/2015 01/07/2015 08/21/2014 07/23/2014 [id number]  Systolic BP 0000000 [id number] - [id number]  Diastolic BP 80 76 - 80 70 80 74  Wt. (Lbs) 205 - 203.71 210 204 203 203  BMI 29.41 - 29.23 29.7 28.46 28.71 28.71   Foot/eye exam completion dates 01/07/2015 08/21/2014  Foot Form Completion Done Done

## 2015-05-16 NOTE — Assessment & Plan Note (Signed)
Improved as far as infection is concerned, but describes significant shortness of breath and wheezing, states he had asthnma intil he was 55 years old, report dyspnea when walking from bedroom to bathroom, will keep out of work for and addittionl several days and treat for asthma flare.Will need PFT at some stage

## 2015-05-16 NOTE — Assessment & Plan Note (Signed)
Improved as far as chills, fatigue and sputum production, will obtain f/u CXR in next several weeks to ensure clearing

## 2015-05-16 NOTE — Assessment & Plan Note (Addendum)
Start symbicort daily, and prednisone dose pack prescribed also, neb treatment done in office also, work excuse extended to cover current acute illness, and depo medrol administered in office Script for neb machine and duoneb also prepared. PFT in future

## 2015-05-22 ENCOUNTER — Other Ambulatory Visit: Payer: Self-pay | Admitting: Family Medicine

## 2015-05-27 LAB — HM DIABETES EYE EXAM

## 2015-06-10 ENCOUNTER — Other Ambulatory Visit (HOSPITAL_COMMUNITY)
Admission: RE | Admit: 2015-06-10 | Discharge: 2015-06-10 | Disposition: A | Discharge status: Home / Self Care | Payer: 59 | Source: Ambulatory Visit | Attending: Family Medicine | Admitting: Family Medicine

## 2015-06-10 ENCOUNTER — Ambulatory Visit (HOSPITAL_COMMUNITY)
Admission: RE | Admit: 2015-06-10 | Discharge: 2015-06-10 | Disposition: A | Discharge status: Home / Self Care | Payer: 59 | Source: Ambulatory Visit | Attending: Family Medicine | Admitting: Family Medicine

## 2015-06-10 DIAGNOSIS — Z8701 Personal history of pneumonia (recurrent): Secondary | ICD-10-CM | POA: Diagnosis not present

## 2015-06-10 DIAGNOSIS — J159 Unspecified bacterial pneumonia: Secondary | ICD-10-CM | POA: Diagnosis present

## 2015-06-10 DIAGNOSIS — M47894 Other spondylosis, thoracic region: Secondary | ICD-10-CM | POA: Diagnosis not present

## 2015-06-19 ENCOUNTER — Ambulatory Visit: Payer: 59 | Admitting: Family Medicine

## 2015-06-19 ENCOUNTER — Other Ambulatory Visit: Payer: Self-pay | Admitting: Family Medicine

## 2015-07-10 ENCOUNTER — Other Ambulatory Visit: Payer: Self-pay | Admitting: Family Medicine

## 2015-08-17 ENCOUNTER — Other Ambulatory Visit: Payer: Self-pay

## 2015-08-17 MED ORDER — RIZATRIPTAN BENZOATE 10 MG PO TBDP: ORAL_TABLET | ORAL | Status: DC

## 2015-09-03 ENCOUNTER — Other Ambulatory Visit (HOSPITAL_COMMUNITY)
Admission: RE | Admit: 2015-09-03 | Discharge: 2015-09-03 | Disposition: A | Discharge status: Home / Self Care | Payer: 59 | Source: Ambulatory Visit | Attending: Family Medicine | Admitting: Family Medicine

## 2015-09-03 ENCOUNTER — Encounter: Payer: Self-pay | Admitting: Family Medicine

## 2015-09-03 ENCOUNTER — Encounter (INDEPENDENT_AMBULATORY_CARE_PROVIDER_SITE_OTHER): Payer: Self-pay

## 2015-09-03 ENCOUNTER — Ambulatory Visit (INDEPENDENT_AMBULATORY_CARE_PROVIDER_SITE_OTHER): Payer: 59 | Admitting: Family Medicine

## 2015-09-03 VITALS — BP 128/80 | HR 66 | Resp 16 | Ht 70.0 in | Wt 204.4 lb

## 2015-09-03 DIAGNOSIS — E1140 Type 2 diabetes mellitus with diabetic neuropathy, unspecified: Secondary | ICD-10-CM

## 2015-09-03 DIAGNOSIS — I10 Essential (primary) hypertension: Secondary | ICD-10-CM | POA: Diagnosis not present

## 2015-09-03 DIAGNOSIS — Z13220 Encounter for screening for lipoid disorders: Secondary | ICD-10-CM | POA: Diagnosis not present

## 2015-09-03 DIAGNOSIS — L0390 Cellulitis, unspecified: Secondary | ICD-10-CM | POA: Diagnosis not present

## 2015-09-03 DIAGNOSIS — L0291 Cutaneous abscess, unspecified: Secondary | ICD-10-CM | POA: Diagnosis not present

## 2015-09-03 DIAGNOSIS — E663 Overweight: Secondary | ICD-10-CM

## 2015-09-03 DIAGNOSIS — Z23 Encounter for immunization: Secondary | ICD-10-CM | POA: Diagnosis not present

## 2015-09-03 LAB — CBC WITH DIFFERENTIAL/PLATELET
BASOS PCT: 0 %
Basophils Absolute: 0 cells/uL (ref 0–200)
EOS ABS: 231 {cells}/uL (ref 15–500)
Eosinophils Relative: 3 %
HCT: 45.2 % (ref 38.5–50.0)
Hemoglobin: 14.7 g/dL (ref 13.2–17.1)
Lymphocytes Relative: 40 %
Lymphs Abs: 3080 cells/uL (ref 850–3900)
MCH: 29.4 pg (ref 27.0–33.0)
MCHC: 32.5 g/dL (ref 32.0–36.0)
MCV: 90.4 fL (ref 80.0–100.0)
MONOS PCT: 8 %
MPV: 9.5 fL (ref 7.5–12.5)
Monocytes Absolute: 616 cells/uL (ref 200–950)
NEUTROS ABS: 3773 {cells}/uL (ref 1500–7800)
Neutrophils Relative %: 49 %
PLATELETS: 233 10*3/uL (ref 140–400)
RBC: 5 MIL/uL (ref 4.20–5.80)
RDW: 14.6 % (ref 11.0–15.0)
WBC: 7.7 10*3/uL (ref 3.8–10.8)

## 2015-09-03 LAB — COMPLETE METABOLIC PANEL WITH GFR
ALK PHOS: 123 U/L — AB (ref 40–115)
ALT: 15 U/L (ref 9–46)
AST: 19 U/L (ref 10–35)
Albumin: 4.6 g/dL (ref 3.6–5.1)
BILIRUBIN TOTAL: 0.7 mg/dL (ref 0.2–1.2)
BUN: 15 mg/dL (ref 7–25)
CO2: 25 mmol/L (ref 20–31)
CREATININE: 0.94 mg/dL (ref 0.70–1.33)
Calcium: 9.4 mg/dL (ref 8.6–10.3)
Chloride: 105 mmol/L (ref 98–110)
GLUCOSE: 82 mg/dL (ref 65–99)
Potassium: 4.7 mmol/L (ref 3.5–5.3)
SODIUM: 140 mmol/L (ref 135–146)
Total Protein: 7.3 g/dL (ref 6.1–8.1)

## 2015-09-03 LAB — TSH: TSH: 0.7 mIU/L (ref 0.40–4.50)

## 2015-09-03 LAB — LIPID PANEL
Cholesterol: 95 mg/dL — ABNORMAL LOW (ref 125–200)
HDL: 40 mg/dL (ref >=40)
LDL CALC: 41 mg/dL (ref <130)
Total CHOL/HDL Ratio: 2.4 Ratio (ref <=5.0)
Triglycerides: 72 mg/dL (ref <150)
VLDL: 14 mg/dL (ref <30)

## 2015-09-03 MED ORDER — SULFAMETHOXAZOLE-TRIMETHOPRIM 800-160 MG PO TABS: 1.0000 | ORAL_TABLET | Freq: Two times a day (BID) | ORAL | 0 refills | Status: DC

## 2015-09-03 MED ORDER — CEFTRIAXONE SODIUM 1 G IJ SOLR
500.0000 mg | Freq: Once | INTRAMUSCULAR | Status: AC
  Administered 2015-09-03: 500 mg via INTRAMUSCULAR

## 2015-09-03 NOTE — Assessment & Plan Note (Signed)
Unchnaged Patient re-educated about  the importance of commitment to a  minimum of 150 minutes of exercise per week.  The importance of healthy food choices with portion control discussed. Encouraged to start a food diary, count calories and to consider  joining a support group. Sample diet sheets offered. Goals set by the patient for the next several months.   Weight /BMI 09/03/2015 05/13/2015 05/08/2015  WEIGHT 204 lb 6.4 oz 205 lb 203 lb 11.3 oz  HEIGHT 5\' 10"  5\' 10"  5\' 10"   BMI 29.33 kg/m2 29.41 kg/m2 29.23 kg/m2

## 2015-09-03 NOTE — Assessment & Plan Note (Signed)
2 week h/o nodule to right side of neck, increasing in size and   firm, reportedly only bloody drainage. Probabable spider bite. Additional 2 lesions on left neck and one on chin. Rocephin in office followed by septra. Urgent surgical eval

## 2015-09-03 NOTE — Patient Instructions (Addendum)
Annual exam Dec 7 or after, call if you need me before  You are being treated for infected skin, Rocephin in office and augmentin sent to your pahrmacy  TdAp today  Labs today  Work excuse to return next Thursday  You are referred urgently to surgery, call after 1:30 today for appt info, the lump on right neck which is very hard may need to be removed  Keep infected skin covered, clean and dry

## 2015-09-03 NOTE — Progress Notes (Signed)
Bryan Rodgers     MRN: [id number]      DOB: [birth year]   HPI Bryan Rodgers is here with a 2 week h/o hard swelling on right side of neck, draining blood, followed by 2 additional swellings on left side of neck. States a large spider was seen in his bed recently, unsure these are due to spider bite. Also has new lesion developing on left chin He has no h/o fever or chills ROS Denies recent fever or chills. Denies sinus pressure, nasal congestion, ear pain or sore throat. Denies chest congestion, productive cough or wheezing. Denies chest pains, palpitations and leg swelling Denies abdominal pain, nausea, vomiting,diarrhea or constipation.   Denies dysuria, frequency, hesitancy or incontinence. Denies joint pain, swelling and limitation in mobility. Denies headaches, seizures, numbness, or tingling. Denies depression, anxiety or insomnia.  PE  BP 128/80   Pulse 66   Resp 16   Ht 5\' 10"  (1.778 m)   Wt 204 lb 6.4 oz (92.7 kg)   SpO2 98%   BMI 29.33 kg/m   Patient alert and oriented and in no cardiopulmonary distress.  HEENT: No facial asymmetry, EOMI,   oropharynx pink and moist.  Neck supple no JVD, no mass.  Chest: Clear to auscultation bilaterally.  CVS: S1, S2 no murmurs, no S3.Regular rate.  ABD: Soft non tender.   Ext: No edema  MS: Adequate ROM spine, shoulders, hips and knees.  Skin:painful nodule on right posterior neck, open with  Serous and bloody drainage, size approx 2.5 cm , firm, 2 additional lesions on left side of neck, max diameter approx 1.5 cm each. Small lesion developing on left chin  Psych: Good eye contact, normal affect. Memory intact not anxious or depressed appearing.  CNS: CN 2-12 intact, power,  normal throughout.no focal deficits noted.   Assessment & Plan  Cellulitis and abscess 2 week h/o nodule to right side of neck, increasing in size and   firm, reportedly only bloody drainage. Probabable spider bite. Additional 2 lesions on  left neck and one on chin. Rocephin in office followed by septra. Urgent surgical eval  HTN (hypertension) Controlled, no change in medication DASH diet and commitment to daily physical activity for a minimum of 30 minutes discussed and encouraged, as a part of hypertension management. The importance of attaining a healthy weight is also discussed.  BP/Weight 09/03/2015 05/13/2015 05/09/2015 05/08/2015 01/07/2015 08/21/2014 [id number]  Systolic BP 0000000 0000000 [id number] - [id number]  Diastolic BP 80 80 76 - 80 70 80  Wt. (Lbs) 204.4 205 - 203.71 210 204 203  BMI 29.33 29.41 - 29.23 29.7 28.46 28.71       Overweight (BMI 25.0-29.9) Unchnaged Patient re-educated about  the importance of commitment to a  minimum of 150 minutes of exercise per week.  The importance of healthy food choices with portion control discussed. Encouraged to start a food diary, count calories and to consider  joining a support group. Sample diet sheets offered. Goals set by the patient for the next several months.   Weight /BMI 09/03/2015 05/13/2015 05/08/2015  WEIGHT 204 lb 6.4 oz 205 lb 203 lb 11.3 oz  HEIGHT 5\' 10"  5\' 10"  5\' 10"   BMI 29.33 kg/m2 29.41 kg/m2 29.23 kg/m2      Diabetes mellitus type 2 in obese [facility]) Bryan Rodgers is reminded of the importance of commitment to daily physical activity for 30 minutes or more, as able and the need to limit carbohydrate intake to 30 to  60 grams per meal to help with blood sugar control.   The need to take medication as prescribed, test blood sugar as directed, and to call between visits if there is a concern that blood sugar is uncontrolled is also discussed.   Bryan Rodgers is reminded of the importance of daily foot exam, annual eye examination, and good blood sugar, blood pressure and cholesterol control. Updated lab needed   Diabetic Labs Latest Ref Rng & Units 05/08/2015 05/07/2015 01/07/2015 08/16/2014 07/23/2014  HbA1c <5.7 % - - 6.2(H) - 6.7(H)  Microalbumin <2.0 mg/dL - - - 0.9 -    Micro/Creat Ratio 0.0 - 30.0 mg/g - - - 7.4 -  Chol 0 - 200 mg/dL - - - 112 -  HDL >=40 mg/dL - - - 27(L) -  Calc LDL 0 - 99 mg/dL - - - 30 -  Triglycerides <150 mg/dL - - - 277(H) -  Creatinine 0.61 - 1.24 mg/dL 0.85 0.85 0.77 0.93 1.08   BP/Weight 09/03/2015 05/13/2015 05/09/2015 05/08/2015 01/07/2015 08/21/2014 [id number]  Systolic BP 0000000 0000000 [id number] - [id number]  Diastolic BP 80 80 76 - 80 70 80  Wt. (Lbs) 204.4 205 - 203.71 210 204 203  BMI 29.33 29.41 - 29.23 29.7 28.46 28.71   Foot/eye exam completion dates Latest Ref Rng & Units 05/27/2015 01/07/2015  Eye Exam No Retinopathy No Retinopathy -  Foot Form Completion - - Done

## 2015-09-03 NOTE — Assessment & Plan Note (Signed)
Bryan Rodgers is reminded of the importance of commitment to daily physical activity for 30 minutes or more, as able and the need to limit carbohydrate intake to 30 to 60 grams per meal to help with blood sugar control.   The need to take medication as prescribed, test blood sugar as directed, and to call between visits if there is a concern that blood sugar is uncontrolled is also discussed.   Bryan Rodgers is reminded of the importance of daily foot exam, annual eye examination, and good blood sugar, blood pressure and cholesterol control. Updated lab needed   Diabetic Labs Latest Ref Rng & Units 05/08/2015 05/07/2015 01/07/2015 08/16/2014 07/23/2014  HbA1c <5.7 % - - 6.2(H) - 6.7(H)  Microalbumin <2.0 mg/dL - - - 0.9 -  Micro/Creat Ratio 0.0 - 30.0 mg/g - - - 7.4 -  Chol 0 - 200 mg/dL - - - 112 -  HDL >=40 mg/dL - - - 27(L) -  Calc LDL 0 - 99 mg/dL - - - 30 -  Triglycerides <150 mg/dL - - - 277(H) -  Creatinine 0.61 - 1.24 mg/dL 0.85 0.85 0.77 0.93 1.08   BP/Weight 09/03/2015 05/13/2015 05/09/2015 05/08/2015 01/07/2015 08/21/2014 [id number]  Systolic BP 0000000 0000000 A999333 - [id number] [id number]  Diastolic BP 80 80 76 - 80 70 80  Wt. (Lbs) 204.4 205 - 203.71 210 204 203  BMI 29.33 29.41 - 29.23 29.7 28.46 28.71   Foot/eye exam completion dates Latest Ref Rng & Units 05/27/2015 01/07/2015  Eye Exam No Retinopathy No Retinopathy -  Foot Form Completion - - Done

## 2015-09-03 NOTE — Assessment & Plan Note (Signed)
Controlled, no change in medication DASH diet and commitment to daily physical activity for a minimum of 30 minutes discussed and encouraged, as a part of hypertension management. The importance of attaining a healthy weight is also discussed.  BP/Weight 09/03/2015 05/13/2015 05/09/2015 05/08/2015 01/07/2015 08/21/2014 [id number]  Systolic BP 0000000 0000000 A999333 - [id number] A999333  Diastolic BP 80 80 76 - 80 70 80  Wt. (Lbs) 204.4 205 - 203.71 210 204 203  BMI 29.33 29.41 - 29.23 29.7 28.46 28.71

## 2015-09-04 LAB — MICROALBUMIN / CREATININE URINE RATIO
CREATININE, URINE: 131 mg/dL (ref 20–370)
Creatinine, Urine: 133.5 mg/dL
MICROALB UR: 20.1 mg/dL
MICROALB UR: 256 ug/mL — AB
MICROALB/CREAT RATIO: 191.8 mg/g{creat} — AB (ref 0.0–30.0)
Microalb Creat Ratio: 153 mcg/mg creat — ABNORMAL HIGH (ref <30)

## 2015-09-04 LAB — HEMOGLOBIN A1C
HEMOGLOBIN A1C: 6.1 % — AB (ref <5.7)
MEAN PLASMA GLUCOSE: 128 mg/dL

## 2015-09-17 ENCOUNTER — Other Ambulatory Visit: Payer: Self-pay | Admitting: Family Medicine

## 2015-09-17 ENCOUNTER — Telehealth: Payer: Self-pay | Admitting: Family Medicine

## 2015-09-17 NOTE — Telephone Encounter (Signed)
Mr. Kummer is asking if he can get a refill on the Proctofoam he is having a hemorrhoid flare up, please advise?

## 2015-09-18 ENCOUNTER — Other Ambulatory Visit: Payer: Self-pay

## 2015-09-18 DIAGNOSIS — K648 Other hemorrhoids: Secondary | ICD-10-CM

## 2015-09-18 MED ORDER — HYDROCORTISONE ACE-PRAMOXINE 1-1 % RE FOAM: 1.0000 | Freq: Two times a day (BID) | RECTAL | 2 refills | Status: AC

## 2015-09-18 NOTE — Telephone Encounter (Signed)
Medication refilled

## 2015-10-15 ENCOUNTER — Other Ambulatory Visit: Payer: Self-pay

## 2015-10-15 MED ORDER — RIZATRIPTAN BENZOATE 10 MG PO TBDP: ORAL_TABLET | ORAL | 0 refills | Status: DC

## 2015-11-02 ENCOUNTER — Other Ambulatory Visit: Payer: Self-pay

## 2015-11-02 MED ORDER — OMEPRAZOLE 40 MG PO CPDR: 40.0000 mg | DELAYED_RELEASE_CAPSULE | Freq: Every day | ORAL | 1 refills | Status: DC

## 2016-01-12 ENCOUNTER — Encounter: Payer: 59 | Admitting: Family Medicine

## 2016-01-18 ENCOUNTER — Other Ambulatory Visit: Payer: Self-pay

## 2016-01-18 ENCOUNTER — Other Ambulatory Visit: Payer: Self-pay | Admitting: Family Medicine

## 2016-01-18 MED ORDER — METOPROLOL SUCCINATE ER 25 MG PO TB24: 25.0000 mg | ORAL_TABLET | Freq: Every day | ORAL | 1 refills | Status: DC

## 2016-01-21 ENCOUNTER — Other Ambulatory Visit: Payer: Self-pay | Admitting: Family Medicine

## 2016-01-22 ENCOUNTER — Other Ambulatory Visit: Payer: Self-pay

## 2016-01-22 DIAGNOSIS — M549 Dorsalgia, unspecified: Secondary | ICD-10-CM

## 2016-01-22 MED ORDER — TRAMADOL HCL 50 MG PO TABS: ORAL_TABLET | ORAL | 0 refills | Status: DC

## 2016-02-10 ENCOUNTER — Other Ambulatory Visit: Payer: Self-pay | Admitting: Family Medicine

## 2016-02-17 ENCOUNTER — Encounter: Payer: 59 | Admitting: Family Medicine

## 2016-02-25 ENCOUNTER — Telehealth: Payer: Self-pay

## 2016-02-25 ENCOUNTER — Other Ambulatory Visit: Payer: Self-pay | Admitting: Family Medicine

## 2016-02-25 MED ORDER — BUTALBITAL-APAP-CAFFEINE 50-325-40 MG PO TABS: ORAL_TABLET | ORAL | 0 refills | Status: DC

## 2016-02-25 NOTE — Telephone Encounter (Signed)
Record review shows he has never been [prescribed topamax. I have primnted, pls send fiopricet , which he has used in the past,pls explain

## 2016-02-26 NOTE — Telephone Encounter (Signed)
Spoke with wife.  She is aware of what medication will be sent to pharmacy.  Acute appointment scheduled.

## 2016-03-07 ENCOUNTER — Ambulatory Visit: Payer: 59 | Admitting: Family Medicine

## 2016-03-23 ENCOUNTER — Other Ambulatory Visit: Payer: Self-pay

## 2016-03-23 MED ORDER — BUTALBITAL-APAP-CAFFEINE 50-325-40 MG PO TABS: ORAL_TABLET | ORAL | 0 refills | Status: DC

## 2016-03-30 ENCOUNTER — Encounter: Payer: 59 | Admitting: Family Medicine

## 2016-04-21 ENCOUNTER — Encounter: Payer: 59 | Admitting: Family Medicine

## 2016-04-28 ENCOUNTER — Encounter: Payer: Self-pay | Admitting: Family Medicine

## 2016-04-28 ENCOUNTER — Ambulatory Visit (INDEPENDENT_AMBULATORY_CARE_PROVIDER_SITE_OTHER): Payer: 59 | Admitting: Family Medicine

## 2016-04-28 VITALS — BP 140/78 | HR 68 | Resp 16 | Ht 70.0 in | Wt 203.1 lb

## 2016-04-28 DIAGNOSIS — M549 Dorsalgia, unspecified: Secondary | ICD-10-CM | POA: Diagnosis not present

## 2016-04-28 DIAGNOSIS — E1169 Type 2 diabetes mellitus with other specified complication: Secondary | ICD-10-CM | POA: Diagnosis not present

## 2016-04-28 DIAGNOSIS — G43009 Migraine without aura, not intractable, without status migrainosus: Secondary | ICD-10-CM | POA: Diagnosis not present

## 2016-04-28 DIAGNOSIS — Z1211 Encounter for screening for malignant neoplasm of colon: Secondary | ICD-10-CM

## 2016-04-28 DIAGNOSIS — R972 Elevated prostate specific antigen [PSA]: Secondary | ICD-10-CM

## 2016-04-28 DIAGNOSIS — I10 Essential (primary) hypertension: Secondary | ICD-10-CM | POA: Diagnosis not present

## 2016-04-28 DIAGNOSIS — Z0000 Encounter for general adult medical examination without abnormal findings: Secondary | ICD-10-CM | POA: Diagnosis not present

## 2016-04-28 DIAGNOSIS — E669 Obesity, unspecified: Secondary | ICD-10-CM | POA: Diagnosis not present

## 2016-04-28 DIAGNOSIS — B351 Tinea unguium: Secondary | ICD-10-CM

## 2016-04-28 LAB — POC HEMOCCULT BLD/STL (OFFICE/1-CARD/DIAGNOSTIC): FECAL OCCULT BLD: POSITIVE — AB

## 2016-04-28 MED ORDER — RIZATRIPTAN BENZOATE 10 MG PO TBDP: ORAL_TABLET | ORAL | 5 refills | Status: DC

## 2016-04-28 MED ORDER — RIZATRIPTAN BENZOATE 10 MG PO TBDP: 10.0000 mg | ORAL_TABLET | ORAL | 3 refills | Status: DC | PRN

## 2016-04-28 MED ORDER — TRAMADOL HCL 50 MG PO TABS: ORAL_TABLET | ORAL | 3 refills | Status: DC

## 2016-04-28 MED ORDER — TERBINAFINE HCL 250 MG PO TABS: 250.0000 mg | ORAL_TABLET | Freq: Every day | ORAL | 1 refills | Status: DC

## 2016-04-28 MED ORDER — TOPIRAMATE 25 MG PO TABS: 25.0000 mg | ORAL_TABLET | Freq: Two times a day (BID) | ORAL | 4 refills | Status: DC

## 2016-04-28 NOTE — Patient Instructions (Addendum)
f/u in 3 month, call if you need me before  Topamax, maxalt, tramadol woill all be sent in and also  terbinafine as discussed    Labs toda  Please work on good  health habits so that your health will improve. 1. Commitment to daily physical activity for 30 to 60  minutes, if you are able to do this.  2. Commitment to wise food choices. Aim for half of your  food intake to be vegetable and fruit, one quarter starchy foods, and one quarter protein. Try to eat on a regular schedule  3 meals per day, snacking between meals should be limited to vegetables or fruits or small portions of nuts. 64 ounces of water per day is generally recommended, unless you have specific health conditions, like heart failure or kidney failure where you will need to limit fluid intake.  3. Commitment to sufficient and a  good quality of physical and mental rest daily, generally between 6 to 8 hours per day.  WITH PERSISTANCE AND PERSEVERANCE, THE IMPOSSIBLE , BECOMES THE NORM! Thank you  for choosing [facility]. We consider it a privelige to serve you.  Delivering excellent health care in a caring and  compassionate way is our goal.  Partnering with you,  so that together we can achieve this goal is our strategy.

## 2016-04-28 NOTE — Progress Notes (Signed)
Bryan Rodgers     MRN: [id number]      DOB: [birth year]   HPI: Patient is in for annual physical exam. c/o headaches over twice weekly, getting insufficient medication and on no prophylactic medication Requests medication for fungal toenail infection Requests medication for back pian    PE; Pleasant male, alert and oriented x 3, in no cardio-pulmonary distress. Afebrile. HEENT No facial trauma or asymetry. Sinuses non tender. EOMI,  External ears normal, tympanic membranes clear. Oropharynx moist, no exudate. Neck: supple, no adenopathy,JVD or thyromegaly.No bruits.  Chest: Clear to ascultation bilaterally.No crackles or wheezes. Non tender to palpation  Breast: No asymetry,no masses. No nipple discharge or inversion. No axillary or supraclavicular adenopathy  Cardiovascular system; Heart sounds normal,  S1 and  S2 ,no S3.  No murmur, or thrill. Apical beat not displaced Peripheral pulses normal.  Abdomen: Soft, non tender, no organomegaly or masses. No bruits. Bowel sounds normal. No guarding, tenderness or rebound.  Rectal:  Normal sphincter tone. No hemorrhoids or  masses. guaiac negative stool. Prostate smooth and firm    Musculoskeletal exam: Full ROM of spine, hips , shoulders and knees. No deformity ,swelling or crepitus noted. No muscle wasting or atrophy.   Neurologic: Cranial nerves 2 to 12 intact. Power, tone ,sensation and reflexes normal throughout. No disturbance in gait. No tremor.  Skin: Intact, no ulceration, erythema , scaling or rash noted. Pigmentation normal throughout  Psych; Normal mood and affect. Judgement and concentration normal   Assessment & Plan:  Migraine headache Uncontrolled pt reports twice weekly at least , headaches, start Topamax twice daily for prophylaxis, Maxalt sent in up to 10 per month. Has been to neurology in the past , no interest in returning at this time voiced  Diabetes mellitus type 2 in obese  [facility]) Updated lab needed at/ before next visit. Bryan Rodgers is reminded of the importance of commitment to daily physical activity for 30 minutes or more, as able and the need to limit carbohydrate intake to 30 to 60 grams per meal to help with blood sugar control.   The need to take medication as prescribed, test blood sugar as directed, and to call between visits if there is a concern that blood sugar is uncontrolled is also discussed.   Bryan Rodgers is reminded of the importance of daily foot exam, annual eye examination, and good blood sugar, blood pressure and cholesterol control.  Diabetic Labs Latest Ref Rng & Units 09/03/2015 09/03/2015 09/03/2015 05/08/2015 05/07/2015  HbA1c <5.7 % - 6.1(H) - - -  Microalbumin Not Estab. ug/mL 256.0(H) - 20.1 - -  Micro/Creat Ratio 0.0 - 30.0 mg/g creat 191.8(H) - 153(H) - -  Chol 125 - 200 mg/dL - 95(L) - - -  HDL >=40 mg/dL - 40 - - -  Calc LDL <130 mg/dL - 41 - - -  Triglycerides <150 mg/dL - 72 - - -  Creatinine 0.70 - 1.33 mg/dL - 0.94 - 0.85 0.85   BP/Weight 04/28/2016 09/03/2015 05/13/2015 05/09/2015 05/08/2015 01/07/2015 7/82/9562  Systolic BP 130 865 784 696 - 295 284  Diastolic BP 78 80 80 76 - 80 70  Wt. (Lbs) 203.12 204.4 205 - 203.71 210 204  BMI 29.14 29.33 29.41 - 29.23 29.7 28.46   Foot/eye exam completion dates Latest Ref Rng & Units 04/28/2016 05/27/2015  Eye Exam No Retinopathy - No Retinopathy  Foot Form Completion - Done -        Onychomycosis Severe onychomycosis all toes  affected, 12 week corse of terbinafine prescribed  Back pain with radiation Chronic low back pain, tramadol one at bedtime for control  Elevated PSA Followed by urology  Annual physical exam Annual exam as documented.  Immunization and cancer screening needs are specifically addressed at this visit.

## 2016-04-28 NOTE — Assessment & Plan Note (Signed)
Severe onychomycosis all toes affected, 12 week corse of terbinafine prescribed

## 2016-04-28 NOTE — Assessment & Plan Note (Addendum)
Annual exam as documented. . Immunization and cancer screening needs are specifically addressed at this visit.  

## 2016-04-28 NOTE — Assessment & Plan Note (Signed)
Uncontrolled pt reports twice weekly at least , headaches, start Topamax twice daily for prophylaxis, Maxalt sent in up to 10 per month. Has been to neurology in the past , no interest in returning at this time voiced

## 2016-04-28 NOTE — Assessment & Plan Note (Signed)
Followed by urology.   

## 2016-04-28 NOTE — Assessment & Plan Note (Signed)
Chronic low back pain, tramadol one at bedtime for control

## 2016-04-28 NOTE — Assessment & Plan Note (Signed)
Updated lab needed at/ before next visit. Bryan Rodgers is reminded of the importance of commitment to daily physical activity for 30 minutes or more, as able and the need to limit carbohydrate intake to 30 to 60 grams per meal to help with blood sugar control.   The need to take medication as prescribed, test blood sugar as directed, and to call between visits if there is a concern that blood sugar is uncontrolled is also discussed.   Bryan Rodgers is reminded of the importance of daily foot exam, annual eye examination, and good blood sugar, blood pressure and cholesterol control.  Diabetic Labs Latest Ref Rng & Units 09/03/2015 09/03/2015 09/03/2015 05/08/2015 05/07/2015  HbA1c <5.7 % - 6.1(H) - - -  Microalbumin Not Estab. ug/mL 256.0(H) - 20.1 - -  Micro/Creat Ratio 0.0 - 30.0 mg/g creat 191.8(H) - 153(H) - -  Chol 125 - 200 mg/dL - 95(L) - - -  HDL >=40 mg/dL - 40 - - -  Calc LDL <130 mg/dL - 41 - - -  Triglycerides <150 mg/dL - 72 - - -  Creatinine 0.70 - 1.33 mg/dL - 0.94 - 0.85 0.85   BP/Weight 04/28/2016 09/03/2015 05/13/2015 05/09/2015 05/08/2015 01/07/2015 0/86/7619  Systolic BP 509 326 712 458 - 099 833  Diastolic BP 78 80 80 76 - 80 70  Wt. (Lbs) 203.12 204.4 205 - 203.71 210 204  BMI 29.14 29.33 29.41 - 29.23 29.7 28.46   Foot/eye exam completion dates Latest Ref Rng & Units 04/28/2016 05/27/2015  Eye Exam No Retinopathy - No Retinopathy  Foot Form Completion - Done -

## 2016-05-05 ENCOUNTER — Other Ambulatory Visit: Payer: Self-pay

## 2016-05-05 MED ORDER — METOPROLOL SUCCINATE ER 25 MG PO TB24: 25.0000 mg | ORAL_TABLET | Freq: Every day | ORAL | 1 refills | Status: DC

## 2016-05-07 ENCOUNTER — Other Ambulatory Visit: Payer: Self-pay | Admitting: Family Medicine

## 2016-05-07 ENCOUNTER — Telehealth: Payer: Self-pay | Admitting: Family Medicine

## 2016-05-07 DIAGNOSIS — R195 Other fecal abnormalities: Secondary | ICD-10-CM

## 2016-05-07 NOTE — Telephone Encounter (Signed)
Pls let pt know that I reviewed his record, he needs to see Dr Oneida Alar as there is hidden blood in his stool, and since 2012 she has sent him letters to schedule an appt twice and he has not been I have entered the referral so he needs to schedule and keep  The appt when he is contacted Also needs to get labs drawn that were ordered at his last visit, they are overdue, thanks

## 2016-05-09 ENCOUNTER — Encounter: Payer: Self-pay | Admitting: Gastroenterology

## 2016-05-17 NOTE — Telephone Encounter (Signed)
Pt has scheduled may appt with GI

## 2016-05-26 ENCOUNTER — Other Ambulatory Visit: Payer: Self-pay

## 2016-05-26 MED ORDER — TRAMADOL HCL 50 MG PO TABS: ORAL_TABLET | ORAL | 3 refills | Status: DC

## 2016-06-08 ENCOUNTER — Ambulatory Visit (INDEPENDENT_AMBULATORY_CARE_PROVIDER_SITE_OTHER): Payer: 59 | Admitting: Gastroenterology

## 2016-06-08 ENCOUNTER — Other Ambulatory Visit: Payer: Self-pay

## 2016-06-08 ENCOUNTER — Encounter: Payer: Self-pay | Admitting: Gastroenterology

## 2016-06-08 DIAGNOSIS — R195 Other fecal abnormalities: Secondary | ICD-10-CM | POA: Diagnosis not present

## 2016-06-08 MED ORDER — PEG 3350-KCL-NA BICARB-NACL 420 G PO SOLR: 4000.0000 mL | ORAL | 0 refills | Status: DC

## 2016-06-08 NOTE — Progress Notes (Signed)
cc'ed to pcp °

## 2016-06-08 NOTE — Patient Instructions (Signed)
PA# for TCS  [id number]

## 2016-06-08 NOTE — Patient Instructions (Signed)
We have scheduled you for a colonoscopy with Dr. Fields in the near future.  Further recommendations to follow!   

## 2016-06-08 NOTE — Progress Notes (Signed)
Primary Care Physician:  Fayrene Helper, MD Primary Gastroenterologist:  Dr. Oneida Alar   Chief Complaint  Patient presents with  . Blood In Stools    heme+, has seen blood in stool after heavy lifting    HPI:   Bryan Rodgers is a 56 y.o. male presenting today at the request of Dr. Moshe Cipro secondary to heme positive stool.   Has seen scant rectal bleeding at times after heavy living. Around 2-3 weeks ago felt constipated just once, but this resolved. No constipation since this time. No abdominal pain. Doesn't "eat like I used to". Stops when he gets full instead of overeating. Proctofoam only with itching. GERD controlled with omeprazole. No dysphagia.   Last colonoscopy in 2007 by Dr. Oneida Alar with internal hemorrhoids, otherwise normal. Remote history of colon polyp in 2004, path unknown.   Past Medical History:  Diagnosis Date  . Alcoholic ([place]) quit 0626   10-15 beers per day on the weekends   . Asthma    childhood asthma up to age 42  . Chest pain    Palpations  . Elevated CPK   . Hemorrhoids    with bleeding   . History of colonic polyps    removed via colonscope  . Hypertension   . Overweight (BMI 25.0-29.9)   . Prediabetes 10/2012   HBA1C is 6.4  . Tobacco abuse     Past Surgical History:  Procedure Laterality Date  . CATARACT EXTRACTION W/ INTRAOCULAR LENS IMPLANT Bilateral 2012  . COLONOSCOPY  2007   Dr. Oneida Alar: internal hemorrhoids, otherwise normal  . COLONOSCOPY  2004   reported history of colon polyp  . hemmorrhoidectomy  2002    Current Outpatient Prescriptions  Medication Sig Dispense Refill  . albuterol (PROVENTIL HFA;VENTOLIN HFA) 108 (90 Base) MCG/ACT inhaler Inhale 2 puffs into the lungs every 6 (six) hours as needed for wheezing or shortness of breath. 1 Inhaler 2  . aspirin EC 81 MG tablet Take 81 mg by mouth at bedtime.    . budesonide-formoterol (SYMBICORT) 160-4.5 MCG/ACT inhaler Inhale 2 puffs into the lungs 2 (two) times daily. 1  Inhaler 3  . chlorthalidone (HYGROTON) 25 MG tablet Take one-half tablet by  mouth daily 45 tablet 1  . metoprolol succinate (TOPROL-XL) 25 MG 24 hr tablet Take 1 tablet (25 mg total) by mouth daily. 90 tablet 1  . omeprazole (PRILOSEC) 40 MG capsule TAKE (1) CAPSULE BY MOUTH ONCE DAILY. 90 capsule 0  . PROCTOFOAM HC rectal foam PLACE APPLICATOR RECTALLY 2 TIMES A DAY AS DIRECTED. (Patient taking differently: PLACE APPLICATOR RECTALLY 2 TIMES A DAY AS DIRECTED. Takes as needed) 10 g 0  . rizatriptan (MAXALT-MLT) 10 MG disintegrating tablet Take 1 tablet (10 mg total) by mouth as needed for migraine. May repeat in 2 hours if needed 10 tablet 3  . topiramate (TOPAMAX) 25 MG tablet Take 1 tablet (25 mg total) by mouth 2 (two) times daily. 60 tablet 4  . traMADol (ULTRAM) 50 MG tablet One tablet at bedtime for back pain, as needed 30 tablet 3  . polyethylene glycol-electrolytes (TRILYTE) 420 g solution Take 4,000 mLs by mouth as directed. 4000 mL 0   No current facility-administered medications for this visit.     Allergies as of 06/08/2016  . (No Known Allergies)    Family History  Problem Relation Age of Onset  . Cancer Mother   . Kidney disease Brother   . Cancer Sister   . Diabetes Sister   .  Colon cancer Neg Hx     Social History   Social History  . Marital status: Married    Spouse name: Marti Sleigh  . Number of children: 0  . 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 Smoking status: Former Smoker    Quit date: 03/16/2012  . Smokeless tobacco: Never Used  . Alcohol use No     Comment: History of alcohol use with beer on weekends, none since 2014   . Drug use: No  . Sexual activity: Yes   Other Topics Concern  . Not on file   Social History Narrative   Patient lives with wife and daughter    Patient has no biological children   Patient is left handed    Patient has a high school education     Review  of Systems: Gen: Denies any fever, chills, fatigue, weight loss, lack of appetite.  CV: Denies chest pain, heart palpitations, peripheral edema, syncope.  Resp: Denies shortness of breath at rest or with exertion. Denies wheezing or cough.  GI: see HPI  GU : Denies urinary burning, urinary frequency, urinary hesitancy MS: Denies joint pain, muscle weakness, cramps, or limitation of movement.  Derm: Denies rash, itching, dry skin Psych: Denies depression, anxiety, memory loss, and confusion Heme: see HPI   Physical Exam: BP 124/73   Pulse (!) 57   Temp 97 F (36.1 C) (Oral)   Ht 5\' 10"  (1.778 m)   Wt 191 lb (86.6 kg)   BMI 27.41 kg/m  General:   Alert and oriented. Pleasant and cooperative. Well-nourished and well-developed.  Head:  Normocephalic and atraumatic. Eyes:  Without icterus, sclera clear and conjunctiva pink.  Ears:  Normal auditory acuity. Nose:  No deformity, discharge,  or lesions. Mouth:  No deformity or lesions, oral mucosa pink.  Lungs:  Clear to auscultation bilaterally. No wheezes, rales, or rhonchi. No distress.  Heart:  S1, S2 present without murmurs appreciated.  Abdomen:  +BS, soft, non-tender and non-distended. No HSM noted. No guarding or rebound. No masses appreciated.  Rectal:  Deferred  Msk:  Symmetrical without gross deformities. Normal posture. Extremities:  Without  edema. Neurologic:  Alert and  oriented x4 Psych:  Alert and cooperative. Normal mood and affect.

## 2016-06-08 NOTE — Assessment & Plan Note (Signed)
56 year old male with remote history of colon polyp in 2004, last colonoscopy 2007 with internal hemorrhoids otherwise normal, presenting with heme positive stool and scant hematochezia. No abdominal pain, weight loss, or upper GI symptoms.   Proceed with colonoscopy with Dr. Oneida Alar in the near future. The risks, benefits, and alternatives have been discussed in detail with the patient. They state understanding and desire to proceed.  PROPOFOL due to history of ETOH abuse in past (none in 4 years) and polypharmacy

## 2016-07-19 NOTE — Patient Instructions (Signed)
Bryan Rodgers  07/19/2016     @PREFPERIOPPHARMACY @   Your procedure is scheduled on 07/26/2016.  Report to [facility] at 7:00 A.M.  Call this number if you have problems the morning of surgery:  [phone]   Remember:  Do not eat food or drink liquids after midnight.  Take these medicines the morning of surgery with A SIP OF WATER Albuterol inhaler and bring with you, Symbicort, Toprol, Prilosec, Topamax, Tramadol   Do not wear jewelry, make-up or nail polish.  Do not wear lotions, powders, or perfumes, or deoderant.  Do not shave 48 hours prior to surgery.  Men may shave face and neck.  Do not bring valuables to the hospital.  [facility] is not responsible for any belongings or valuables.  Contacts, dentures or bridgework may not be worn into surgery.  Leave your suitcase in the car.  After surgery it may be brought to your room.  For patients admitted to the hospital, discharge time will be determined by your treatment team.  Patients discharged the day of surgery will not be allowed to drive home.    Please read over the following fact sheets that you were given. Anesthesia Post-op Instructions     PATIENT INSTRUCTIONS POST-ANESTHESIA  IMMEDIATELY FOLLOWING SURGERY:  Do not drive or operate machinery for the first twenty four hours after surgery.  Do not make any important decisions for twenty four hours after surgery or while taking narcotic pain medications or sedatives.  If you develop intractable nausea and vomiting or a severe headache please notify your doctor immediately.  FOLLOW-UP:  Please make an appointment with your surgeon as instructed. You do not need to follow up with anesthesia unless specifically instructed to do so.  WOUND CARE INSTRUCTIONS (if applicable):  Keep a dry clean dressing on the anesthesia/puncture wound site if there is drainage.  Once the wound has quit draining you may leave it open to air.  Generally you should leave the bandage  intact for twenty four hours unless there is drainage.  If the epidural site drains for more than 36-48 hours please call the anesthesia department.  QUESTIONS?:  Please feel free to call your physician or the hospital operator if you have any questions, and they will be happy to assist you.      Colonoscopy, Adult A colonoscopy is an exam to look at the entire large intestine. During the exam, a lubricated, bendable tube is inserted into the anus and then passed into the rectum, colon, and other parts of the large intestine. A colonoscopy is often done as a part of normal colorectal screening or in response to certain symptoms, such as anemia, persistent diarrhea, abdominal pain, and blood in the stool. The exam can help screen for and diagnose medical problems, including:  Tumors.  Polyps.  Inflammation.  Areas of bleeding.  Tell a health care provider about:  Any allergies you have.  All medicines you are taking, including vitamins, herbs, eye drops, creams, and over-the-counter medicines.  Any problems you or family members have had with anesthetic medicines.  Any blood disorders you have.  Any surgeries you have had.  Any medical conditions you have.  Any problems you have had passing stool. What are the risks? Generally, this is a safe procedure. However, problems may occur, including:  Bleeding.  A tear in the intestine.  A reaction to medicines given during the exam.  Infection (rare).  What happens before the procedure? Eating and drinking  restrictions Follow instructions from your health care provider about eating and drinking, which may include:  A few days before the procedure - follow a low-fiber diet. Avoid nuts, seeds, dried fruit, raw fruits, and vegetables.  1-3 days before the procedure - follow a clear liquid diet. Drink only clear liquids, such as clear broth or bouillon, black coffee or tea, clear juice, clear soft drinks or sports drinks, gelatin  dessert, and popsicles. Avoid any liquids that contain red or purple dye.  On the day of the procedure - do not eat or drink anything during the 2 hours before the procedure, or within the time period that your health care provider recommends.  Bowel prep If you were prescribed an oral bowel prep to clean out your colon:  Take it as told by your health care provider. Starting the day before your procedure, you will need to drink a large amount of medicated liquid. The liquid will cause you to have multiple loose stools until your stool is almost clear or light green.  If your skin or anus gets irritated from diarrhea, you may use these to relieve the irritation: ? Medicated wipes, such as adult wet wipes with aloe and vitamin E. ? A skin soothing-product like petroleum jelly.  If you vomit while drinking the bowel prep, take a break for up to 60 minutes and then begin the bowel prep again. If vomiting continues and you cannot take the bowel prep without vomiting, call your health care provider.  General instructions  Ask your health care provider about changing or stopping your regular medicines. This is especially important if you are taking diabetes medicines or blood thinners.  Plan to have someone take you home from the hospital or clinic. What happens during the procedure?  An IV tube may be inserted into one of your veins.  You will be given medicine to help you relax (sedative).  To reduce your risk of infection: ? Your health care team will wash or sanitize their hands. ? Your anal area will be washed with soap.  You will be asked to lie on your side with your knees bent.  Your health care provider will lubricate a long, thin, flexible tube. The tube will have a camera and a light on the end.  The tube will be inserted into your anus.  The tube will be gently eased through your rectum and colon.  Air will be delivered into your colon to keep it open. You may feel some  pressure or cramping.  The camera will be used to take images during the procedure.  A small tissue sample may be removed from your body to be examined under a microscope (biopsy). If any potential problems are found, the tissue will be sent to a lab for testing.  If small polyps are found, your health care provider may remove them and have them checked for cancer cells.  The tube that was inserted into your anus will be slowly removed. The procedure may vary among health care providers and hospitals. What happens after the procedure?  Your blood pressure, heart rate, breathing rate, and blood oxygen level will be monitored until the medicines you were given have worn off.  Do not drive for 24 hours after the exam.  You may have a small amount of blood in your stool.  You may pass gas and have mild abdominal cramping or bloating due to the air that was used to inflate your colon during the exam.  It is up to you to get the results of your procedure. Ask your health care provider, or the department performing the procedure, when your results will be ready. This information is not intended to replace advice given to you by your health care provider. Make sure you discuss any questions you have with your health care provider. Document Released: 01/15/2000 Document Revised: 11/18/2015 Document Reviewed: 03/31/2015 Elsevier Interactive Patient Education  2018 [employer].

## 2016-07-21 ENCOUNTER — Encounter (HOSPITAL_COMMUNITY)
Admission: RE | Admit: 2016-07-21 | Discharge: 2016-07-21 | Disposition: A | Discharge status: Home / Self Care | Payer: 59 | Source: Ambulatory Visit | Attending: Gastroenterology | Admitting: Gastroenterology

## 2016-07-21 ENCOUNTER — Encounter (HOSPITAL_COMMUNITY): Payer: Self-pay

## 2016-07-21 ENCOUNTER — Other Ambulatory Visit: Payer: Self-pay

## 2016-07-21 DIAGNOSIS — R9431 Abnormal electrocardiogram [ECG] [EKG]: Secondary | ICD-10-CM | POA: Diagnosis not present

## 2016-07-21 DIAGNOSIS — Z01810 Encounter for preprocedural cardiovascular examination: Secondary | ICD-10-CM | POA: Insufficient documentation

## 2016-07-21 DIAGNOSIS — I10 Essential (primary) hypertension: Secondary | ICD-10-CM | POA: Insufficient documentation

## 2016-07-21 DIAGNOSIS — Z01812 Encounter for preprocedural laboratory examination: Secondary | ICD-10-CM | POA: Insufficient documentation

## 2016-07-21 HISTORY — DX: Dyspnea, unspecified: R06.00

## 2016-07-21 HISTORY — DX: Headache, unspecified: R51.9

## 2016-07-21 HISTORY — DX: Headache: R51

## 2016-07-21 HISTORY — DX: Anemia, unspecified: D64.9

## 2016-07-21 HISTORY — DX: Unspecified osteoarthritis, unspecified site: M19.90

## 2016-07-21 LAB — BASIC METABOLIC PANEL
Anion gap: 7 (ref 5–15)
BUN: 18 mg/dL (ref 6–20)
CHLORIDE: 104 mmol/L (ref 101–111)
CO2: 26 mmol/L (ref 22–32)
CREATININE: 0.89 mg/dL (ref 0.61–1.24)
Calcium: 9.2 mg/dL (ref 8.9–10.3)
GFR calc Af Amer: >60 mL/min (ref >60)
GFR calc non Af Amer: >60 mL/min (ref >60)
GLUCOSE: 107 mg/dL — AB (ref 65–99)
Potassium: 3.6 mmol/L (ref 3.5–5.1)
Sodium: 137 mmol/L (ref 135–145)

## 2016-07-21 LAB — CBC
HCT: 45 % (ref 39.0–52.0)
Hemoglobin: 14.9 g/dL (ref 13.0–17.0)
MCH: 29.4 pg (ref 26.0–34.0)
MCHC: 33.1 g/dL (ref 30.0–36.0)
MCV: 88.9 fL (ref 78.0–100.0)
PLATELETS: 180 10*3/uL (ref 150–400)
RBC: 5.06 MIL/uL (ref 4.22–5.81)
RDW: 14.3 % (ref 11.5–15.5)
WBC: 5.8 10*3/uL (ref 4.0–10.5)

## 2016-07-21 NOTE — Progress Notes (Signed)
LMOM labs normal except slightly elevated glucose. Call with questions.

## 2016-07-26 ENCOUNTER — Encounter (HOSPITAL_COMMUNITY)
Admission: RE | Disposition: A | Discharge status: Home / Self Care | Payer: Self-pay | Source: Ambulatory Visit | Attending: Gastroenterology

## 2016-07-26 ENCOUNTER — Telehealth: Payer: Self-pay | Admitting: *Deleted

## 2016-07-26 ENCOUNTER — Ambulatory Visit (HOSPITAL_COMMUNITY)
Admission: RE | Admit: 2016-07-26 | Discharge: 2016-07-26 | Disposition: A | Discharge status: Home / Self Care | Payer: 59 | Source: Ambulatory Visit | Attending: Gastroenterology | Admitting: Gastroenterology

## 2016-07-26 ENCOUNTER — Ambulatory Visit (HOSPITAL_COMMUNITY): Payer: 59 | Admitting: Anesthesiology

## 2016-07-26 ENCOUNTER — Encounter (HOSPITAL_COMMUNITY): Payer: Self-pay | Admitting: *Deleted

## 2016-07-26 DIAGNOSIS — K635 Polyp of colon: Secondary | ICD-10-CM | POA: Insufficient documentation

## 2016-07-26 DIAGNOSIS — Z87891 Personal history of nicotine dependence: Secondary | ICD-10-CM | POA: Insufficient documentation

## 2016-07-26 DIAGNOSIS — I10 Essential (primary) hypertension: Secondary | ICD-10-CM | POA: Diagnosis not present

## 2016-07-26 DIAGNOSIS — J45909 Unspecified asthma, uncomplicated: Secondary | ICD-10-CM | POA: Diagnosis not present

## 2016-07-26 DIAGNOSIS — J449 Chronic obstructive pulmonary disease, unspecified: Secondary | ICD-10-CM | POA: Diagnosis not present

## 2016-07-26 DIAGNOSIS — R195 Other fecal abnormalities: Secondary | ICD-10-CM

## 2016-07-26 DIAGNOSIS — K219 Gastro-esophageal reflux disease without esophagitis: Secondary | ICD-10-CM | POA: Insufficient documentation

## 2016-07-26 DIAGNOSIS — K644 Residual hemorrhoidal skin tags: Secondary | ICD-10-CM | POA: Diagnosis not present

## 2016-07-26 DIAGNOSIS — Z7982 Long term (current) use of aspirin: Secondary | ICD-10-CM | POA: Insufficient documentation

## 2016-07-26 DIAGNOSIS — K5730 Diverticulosis of large intestine without perforation or abscess without bleeding: Secondary | ICD-10-CM | POA: Diagnosis not present

## 2016-07-26 DIAGNOSIS — K648 Other hemorrhoids: Secondary | ICD-10-CM | POA: Diagnosis not present

## 2016-07-26 DIAGNOSIS — E119 Type 2 diabetes mellitus without complications: Secondary | ICD-10-CM | POA: Insufficient documentation

## 2016-07-26 DIAGNOSIS — Z79899 Other long term (current) drug therapy: Secondary | ICD-10-CM | POA: Insufficient documentation

## 2016-07-26 DIAGNOSIS — D123 Benign neoplasm of transverse colon: Secondary | ICD-10-CM | POA: Diagnosis not present

## 2016-07-26 HISTORY — PX: COLONOSCOPY WITH PROPOFOL: SHX5780

## 2016-07-26 HISTORY — PX: POLYPECTOMY: SHX5525

## 2016-07-26 LAB — GLUCOSE, CAPILLARY: Glucose-Capillary: 84 mg/dL (ref 65–99)

## 2016-07-26 SURGERY — COLONOSCOPY WITH PROPOFOL
Anesthesia: Monitor Anesthesia Care

## 2016-07-26 MED ORDER — CHLORHEXIDINE GLUCONATE CLOTH 2 % EX PADS: 6.0000 | MEDICATED_PAD | Freq: Once | CUTANEOUS | Status: DC

## 2016-07-26 MED ORDER — PROPOFOL 500 MG/50ML IV EMUL
INTRAVENOUS | Status: DC | PRN
  Administered 2016-07-26: 09:00:00 via INTRAVENOUS
  Administered 2016-07-26: 125 ug/kg/min via INTRAVENOUS

## 2016-07-26 MED ORDER — STERILE WATER FOR IRRIGATION IR SOLN
Status: DC | PRN
  Administered 2016-07-26: 100 mL

## 2016-07-26 MED ORDER — MIDAZOLAM HCL 2 MG/2ML IJ SOLN
INTRAMUSCULAR | Status: AC
  Filled 2016-07-26: qty 2

## 2016-07-26 MED ORDER — LACTATED RINGERS IV SOLN
INTRAVENOUS | Status: DC
  Administered 2016-07-26: 1000 mL via INTRAVENOUS

## 2016-07-26 MED ORDER — PROPOFOL 10 MG/ML IV BOLUS
INTRAVENOUS | Status: AC
  Filled 2016-07-26: qty 40

## 2016-07-26 MED ORDER — MIDAZOLAM HCL 2 MG/2ML IJ SOLN
1.0000 mg | Freq: Once | INTRAMUSCULAR | Status: AC | PRN
  Administered 2016-07-26: 2 mg via INTRAVENOUS

## 2016-07-26 MED ORDER — PROPOFOL 10 MG/ML IV BOLUS
INTRAVENOUS | Status: DC | PRN
  Administered 2016-07-26 (×3): 10 mg via INTRAVENOUS

## 2016-07-26 NOTE — H&P (Signed)
Primary Care Physician:  Fayrene Helper, MD Primary Gastroenterologist:  Dr. Oneida Alar  Pre-Procedure History & Physical: HPI:  Bryan Rodgers is a 56 y.o. male here for  PERSONAL HISTORY OF POLYPS.  Past Medical History:  Diagnosis Date  . Alcoholic ([place]) quit 8101   10-15 beers per day on the weekends   . Anemia   . Arthritis   . Asthma    childhood asthma up to age 34  . Chest pain    Palpations  . Dyspnea   . Elevated CPK   . Headache   . Hemorrhoids    with bleeding   . History of colonic polyps    removed via colonscope  . Hypertension   . Overweight (BMI 25.0-29.9)   . Prediabetes 10/2012   HBA1C is 6.4  . Tobacco abuse     Past Surgical History:  Procedure Laterality Date  . CATARACT EXTRACTION W/ INTRAOCULAR LENS IMPLANT Bilateral 2012  . COLONOSCOPY  2007   Dr. Oneida Alar: internal hemorrhoids, otherwise normal  . COLONOSCOPY  2004   reported history of colon polyp  . hemmorrhoidectomy  2002    Prior to Admission medications   Medication Sig Start Date End Date Taking? Authorizing Provider  albuterol (PROVENTIL HFA;VENTOLIN HFA) 108 (90 Base) MCG/ACT inhaler Inhale 2 puffs into the lungs every 6 (six) hours as needed for wheezing or shortness of breath. 05/09/15  Yes Kathie Dike, MD  aspirin EC 81 MG tablet Take 81 mg by mouth at bedtime.   Yes [provider]  budesonide-formoterol (SYMBICORT) 160-4.5 MCG/ACT inhaler Inhale 2 puffs into the lungs 2 (two) times daily. 05/13/15  Yes Fayrene Helper, MD  chlorthalidone (HYGROTON) 25 MG tablet Take one-half tablet by  mouth daily 03/26/15  Yes Fayrene Helper, MD  metoprolol succinate (TOPROL-XL) 25 MG 24 hr tablet Take 1 tablet (25 mg total) by mouth daily. 05/05/16  Yes Fayrene Helper, MD  omeprazole (PRILOSEC) 40 MG capsule TAKE (1) CAPSULE BY MOUTH ONCE DAILY. 01/21/16  Yes Fayrene Helper, MD  polyethylene glycol-electrolytes (TRILYTE) 420 g solution Take 4,000 mLs by mouth as  directed. 06/08/16  Yes Domonick Sittner L, MD  rizatriptan (MAXALT-MLT) 10 MG disintegrating tablet Take 1 tablet (10 mg total) by mouth as needed for migraine. May repeat in 2 hours if needed 04/28/16  Yes Fayrene Helper, MD  traMADol Veatrice Bourbon) 50 MG tablet One tablet at bedtime for back pain, as needed 05/26/16  Yes Fayrene Helper, MD  Aspirin-Acetaminophen-Caffeine (EXCEDRIN EXTRA STRENGTH PO) Take 2 tablets by mouth daily as needed (migraines).    [provider]  PROCTOFOAM HC rectal foam PLACE APPLICATOR RECTALLY 2 TIMES A DAY AS DIRECTED. Patient taking differently: PLACE APPLICATOR RECTALLY 2 TIMES A DAY AS DIRECTED. Takes as needed 09/18/15   Fayrene Helper, MD  topiramate (TOPAMAX) 25 MG tablet Take 1 tablet (25 mg total) by mouth 2 (two) times daily. Patient not taking: Reported on 07/21/2016 04/28/16   Fayrene Helper, MD    Allergies as of 06/08/2016  . (No Known Allergies)    Family History  Problem Relation Age of Onset  . Cancer Mother   . Kidney disease Brother   . Cancer Sister   . Diabetes Sister   . Colon cancer Neg Hx     Social History   Social History  . Marital status: Married    Spouse name: Marti Sleigh  . Number of children: 0  . 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 Smoking status: Former Smoker    Quit date: 03/16/2012  . Smokeless tobacco: Never Used  . Alcohol use No     Comment: History of alcohol use with beer on weekends, none since 2014   . Drug use: No  . Sexual activity: Yes   Other Topics Concern  . Not on file   Social History Narrative   Patient lives with wife and daughter    Patient has no biological children   Patient is left handed    Patient has a high school education     Review of Systems: See HPI, otherwise negative ROS   Physical Exam: BP 130/77   Pulse 89   Temp 97.5 F (36.4 C) (Oral)   Resp (!) 28   Ht 5\' 10"  (1.778 m)    Wt 199 lb (90.3 kg)   SpO2 99%   BMI 28.55 kg/m  General:   Alert,  pleasant and cooperative in NAD Head:  Normocephalic and atraumatic. Neck:  Supple; Lungs:  Clear throughout to auscultation.    Heart:  Regular rate and rhythm. Abdomen:  Soft, nontender and nondistended. Normal bowel sounds, without guarding, and without rebound.   Neurologic:  Alert and  oriented x4;  grossly normal neurologically.  Impression/Plan:     PERSONAL HISTORY OF POLYPS.  PLAN: 1. TCS TODAY. DISCUSSED PROCEDURE, BENEFITS, & RISKS: < 1% chance of medication reaction, bleeding, perforation, or rupture of spleen/liver.

## 2016-07-26 NOTE — Anesthesia Postprocedure Evaluation (Signed)
Anesthesia Post Note  Patient: ABDULLAHI VALLONE  Procedure(s) Performed: Procedure(s) (LRB): COLONOSCOPY WITH PROPOFOL (N/A) POLYPECTOMY  Patient location during evaluation: PACU Anesthesia Type: MAC Level of consciousness: awake and alert and oriented Pain management: pain level controlled Vital Signs Assessment: post-procedure vital signs reviewed and stable Respiratory status: spontaneous breathing Cardiovascular status: stable Postop Assessment: no signs of nausea or vomiting and adequate PO intake Anesthetic complications: no Comments: CBG 84, given juice to drink.     Last Vitals:  Vitals:   07/26/16 0930 07/26/16 0936  BP: 124/79 131/83  Pulse: 86 91  Resp: 14 16  Temp:  36.7 C    Last Pain:  Vitals:   07/26/16 0936  TempSrc: Oral                 Elianne Gubser

## 2016-07-26 NOTE — Discharge Instructions (Signed)
Colonoscopy, Adult, Care After This sheet gives you information about how to care for yourself after your procedure. Your doctor may also give you more specific instructions. If you have problems or questions, call your doctor. Follow these instructions at home: General instructions   For the first 24 hours after the procedure: ? Do not drive or use machinery. ? Do not sign important documents. ? Do not drink alcohol. ? Do your daily activities more slowly than normal. ? Eat foods that are soft and easy to digest. ? Rest often.  Take over-the-counter or prescription medicines only as told by your doctor.  It is up to you to get the results of your procedure. Ask your doctor, or the department performing the procedure, when your results will be ready. To help cramping and bloating:  Try walking around.  Put heat on your belly (abdomen) as told by your doctor. Use a heat source that your doctor recommends, such as a moist heat pack or a heating pad. ? Put a towel between your skin and the heat source. ? Leave the heat on for 20-30 minutes. ? Remove the heat if your skin turns bright red. This is especially important if you cannot feel pain, heat, or cold. You can get burned. Eating and drinking  Drink enough fluid to keep your pee (urine) clear or pale yellow.  Return to your normal diet as told by your doctor. Avoid heavy or fried foods that are hard to digest.  Avoid drinking alcohol for as long as told by your doctor. Contact a doctor if:  You have blood in your poop (stool) 2-3 days after the procedure. Get help right away if:  You have more than a small amount of blood in your poop.  You see large clumps of tissue (blood clots) in your poop.  Your belly is swollen.  You feel sick to your stomach (nauseous).  You throw up (vomit).  You have a fever.  You have belly pain that gets worse, and medicine does not help your pain. This information is not intended to  replace advice given to you by your health care provider. Make sure you discuss any questions you have with your health care provider. Document Released: 02/19/2010 Document Revised: 10/12/2015 Document Reviewed: 10/12/2015 Elsevier Interactive Patient Education  2017 [employer]   PATIENT INSTRUCTIONS POST-ANESTHESIA  IMMEDIATELY FOLLOWING SURGERY:  Do not drive or operate machinery for the first twenty four hours after surgery.  Do not make any important decisions for twenty four hours after surgery or while taking narcotic pain medications or sedatives.  If you develop intractable nausea and vomiting or a severe headache please notify your doctor immediately.  FOLLOW-UP:  Please make an appointment with your surgeon as instructed. You do not need to follow up with anesthesia unless specifically instructed to do so.  WOUND CARE INSTRUCTIONS (if applicable):  Keep a dry clean dressing on the anesthesia/puncture wound site if there is drainage.  Once the wound has quit draining you may leave it open to air.  Generally you should leave the bandage intact for twenty four hours unless there is drainage.  If the epidural site drains for more than 36-48 hours please call the anesthesia department.  QUESTIONS?:  Please feel free to call your physician or the hospital operator if you have any questions, and they will be happy to assist you.        Colon Polyps Polyps are tissue growths inside the body. Polyps can grow  in many places, including the large intestine (colon). A polyp may be a round bump or a mushroom-shaped growth. You could have one polyp or several. Most colon polyps are noncancerous (benign). However, some colon polyps can become cancerous over time. What are the causes? The exact cause of colon polyps is not known. What increases the risk? This condition is more likely to develop in people who:  Have a family history of colon cancer or colon polyps.  Are older than 9 or  older than 45 if they are African American.  Have inflammatory bowel disease, such as ulcerative colitis or Crohn disease.  Are overweight.  Smoke cigarettes.  Do not get enough exercise.  Drink too much alcohol.  Eat a diet that is: ? High in fat and red meat. ? Low in fiber.  Had childhood cancer that was treated with abdominal radiation.  What are the signs or symptoms? Most polyps do not cause symptoms. If you have symptoms, they may include:  Blood coming from your rectum when having a bowel movement.  Blood in your stool.The stool may look dark red or black.  A change in bowel habits, such as constipation or diarrhea.  How is this diagnosed? This condition is diagnosed with a colonoscopy. This is a procedure that uses a lighted, flexible scope to look at the inside of your colon. How is this treated? Treatment for this condition involves removing any polyps that are found. Those polyps will then be tested for cancer. If cancer is found, your health care provider will talk to you about options for colon cancer treatment. Follow these instructions at home: Diet  Eat plenty of fiber, such as fruits, vegetables, and whole grains.  Eat foods that are high in calcium and vitamin D, such as milk, cheese, yogurt, eggs, liver, fish, and broccoli.  Limit foods high in fat, red meats, and processed meats, such as hot dogs, sausage, bacon, and lunch meats.  Maintain a healthy weight, or lose weight if recommended by your health care provider. General instructions  Do not smoke cigarettes.  Do not drink alcohol excessively.  Keep all follow-up visits as told by your health care provider. This is important. This includes keeping regularly scheduled colonoscopies. Talk to your health care provider about when you need a colonoscopy.  Exercise every day or as told by your health care provider. Contact a health care provider if:  You have new or worsening bleeding during a  bowel movement.  You have new or increased blood in your stool.  You have a change in bowel habits.  You unexpectedly lose weight. This information is not intended to replace advice given to you by your health care provider. Make sure you discuss any questions you have with your health care provider. Document Released: 10/14/2003 Document Revised: 06/25/2015 Document Reviewed: 12/08/2014 Elsevier Interactive Patient Education  2018 [employer].    Hemorrhoids Hemorrhoids are swollen veins in and around the rectum or anus. There are two types of hemorrhoids:  Internal hemorrhoids. These occur in the veins that are just inside the rectum. They may poke through to the outside and become irritated and painful.  External hemorrhoids. These occur in the veins that are outside of the anus and can be felt as a painful swelling or hard lump near the anus.  Most hemorrhoids do not cause serious problems, and they can be managed with home treatments such as diet and lifestyle changes. If home treatments do not help your symptoms, procedures  can be done to shrink or remove the hemorrhoids. What are the causes? This condition is caused by increased pressure in the anal area. This pressure may result from various things, including:  Constipation.  Straining to have a bowel movement.  Diarrhea.  Pregnancy.  Obesity.  Sitting for long periods of time.  Heavy lifting or other activity that causes you to strain.  Anal sex.  What are the signs or symptoms? Symptoms of this condition include:  Pain.  Anal itching or irritation.  Rectal bleeding.  Leakage of stool (feces).  Anal swelling.  One or more lumps around the anus.  How is this diagnosed? This condition can often be diagnosed through a visual exam. Other exams or tests may also be done, such as:  Examination of the rectal area with a gloved hand (digital rectal exam).  Examination of the anal canal using a small tube  (anoscope).  A blood test, if you have lost a significant amount of blood.  A test to look inside the colon (sigmoidoscopy or colonoscopy).  How is this treated? This condition can usually be treated at home. However, various procedures may be done if dietary changes, lifestyle changes, and other home treatments do not help your symptoms. These procedures can help make the hemorrhoids smaller or remove them completely. Some of these procedures involve surgery, and others do not. Common procedures include:  Rubber band ligation. Rubber bands are placed at the base of the hemorrhoids to cut off the blood supply to them.  Sclerotherapy. Medicine is injected into the hemorrhoids to shrink them.  Infrared coagulation. A type of light energy is used to get rid of the hemorrhoids.  Hemorrhoidectomy surgery. The hemorrhoids are surgically removed, and the veins that supply them are tied off.  Stapled hemorrhoidopexy surgery. A circular stapling device is used to remove the hemorrhoids and use staples to cut off the blood supply to them.  Follow these instructions at home: Eating and drinking  Eat foods that have a lot of fiber in them, such as whole grains, beans, nuts, fruits, and vegetables. Ask your health care provider about taking products that have added fiber (fiber supplements).  Drink enough fluid to keep your urine clear or pale yellow. Managing pain and swelling  Take warm sitz baths for 20 minutes, 3-4 times a day to ease pain and discomfort.  If directed, apply ice to the affected area. Using ice packs between sitz baths may be helpful. ? Put ice in a plastic bag. ? Place a towel between your skin and the bag. ? Leave the ice on for 20 minutes, 2-3 times a day. General instructions  Take over-the-counter and prescription medicines only as told by your health care provider.  Use medicated creams or suppositories as told.  Exercise regularly.  Go to the bathroom when you  have the urge to have a bowel movement. Do not wait.  Avoid straining to have bowel movements.  Keep the anal area dry and clean. Use wet toilet paper or moist towelettes after a bowel movement.  Do not sit on the toilet for long periods of time. This increases blood pooling and pain. Contact a health care provider if:  You have increasing pain and swelling that are not controlled by treatment or medicine.  You have uncontrolled bleeding.  You have difficulty having a bowel movement, or you are unable to have a bowel movement.  You have pain or inflammation outside the area of the hemorrhoids. This information is  not intended to replace advice given to you by your health care provider. Make sure you discuss any questions you have with your health care provider. Document Released: 01/15/2000 Document Revised: 06/17/2015 Document Reviewed: 10/01/2014 Elsevier Interactive Patient Education  2017 [employer] Inc.    Diverticulosis Diverticulosis is a condition that develops when small pouches (diverticula) form in the wall of the large intestine (colon). The colon is where water is absorbed and stool is formed. The pouches form when the inside layer of the colon pushes through weak spots in the outer layers of the colon. You may have a few pouches or many of them. What are the causes? The cause of this condition is not known. What increases the risk? The following factors may make you more likely to develop this condition:  Being older than age 43. Your risk for this condition increases with age. Diverticulosis is rare among people younger than age 46. By age 81, many people have it.  Eating a low-fiber diet.  Having frequent constipation.  Being overweight.  Not getting enough exercise.  Smoking.  Taking over-the-counter pain medicines, like aspirin and ibuprofen.  Having a family history of diverticulosis.  What are the signs or symptoms? In most people, there are no  symptoms of this condition. If you do have symptoms, they may include:  Bloating.  Cramps in the abdomen.  Constipation or diarrhea.  Pain in the lower left side of the abdomen.  How is this diagnosed? This condition is most often diagnosed during an exam for other colon problems. Because diverticulosis usually has no symptoms, it often cannot be diagnosed independently. This condition may be diagnosed by:  Using a flexible scope to examine the colon (colonoscopy).  Taking an X-ray of the colon after dye has been put into the colon (barium enema).  Doing a CT scan.  How is this treated? You may not need treatment for this condition if you have never developed an infection related to diverticulosis. If you have had an infection before, treatment may include:  Eating a high-fiber diet. This may include eating more fruits, vegetables, and grains.  Taking a fiber supplement.  Taking a live bacteria supplement (probiotic).  Taking medicine to relax your colon.  Taking antibiotic medicines.  Follow these instructions at home:  Drink 6-8 glasses of water or more each day to prevent constipation.  Try not to strain when you have a bowel movement.  If you have had an infection before: ? Eat more fiber as directed by your health care provider or your diet and nutrition specialist (dietitian). ? Take a fiber supplement or probiotic, if your health care provider approves.  Take over-the-counter and prescription medicines only as told by your health care provider.  If you were prescribed an antibiotic, take it as told by your health care provider. Do not stop taking the antibiotic even if you start to feel better.  Keep all follow-up visits as told by your health care provider. This is important. Contact a health care provider if:  You have pain in your abdomen.  You have bloating.  You have cramps.  You have not had a bowel movement in 3 days. Get help right away  if:  Your pain gets worse.  Your bloating becomes very bad.  You have a fever or chills, and your symptoms suddenly get worse.  You vomit.  You have bowel movements that are bloody or black.  You have bleeding from your rectum. Summary  Diverticulosis is a  condition that develops when small pouches (diverticula) form in the wall of the large intestine (colon).  You may have a few pouches or many of them.  This condition is most often diagnosed during an exam for other colon problems.  If you have had an infection related to diverticulosis, treatment may include increasing the fiber in your diet, taking supplements, or taking medicines. This information is not intended to replace advice given to you by your health care provider. Make sure you discuss any questions you have with your health care provider. Document Released: 10/15/2003 Document Revised: 12/07/2015 Document Reviewed: 12/07/2015 Elsevier Interactive Patient Education  2017 [place].    High-Fiber Diet Fiber, also called dietary fiber, is a type of carbohydrate found in fruits, vegetables, whole grains, and beans. A high-fiber diet can have many health benefits. Your health care provider may recommend a high-fiber diet to help:  Prevent constipation. Fiber can make your bowel movements more regular.  Lower your cholesterol.  Relieve hemorrhoids, uncomplicated diverticulosis, or irritable bowel syndrome.  Prevent overeating as part of a weight-loss plan.  Prevent heart disease, type 2 diabetes, and certain cancers.  What is my plan? The recommended daily intake of fiber includes:  38 grams for men under age 89.  30 grams for men over age 46.  73 grams for women under age 74.  88 grams for women over age 30.  You can get the recommended daily intake of dietary fiber by eating a variety of fruits, vegetables, grains, and beans. Your health care provider may also recommend a fiber supplement if it is  not possible to get enough fiber through your diet. What do I need to know about a high-fiber diet?  Fiber supplements have not been widely studied for their effectiveness, so it is better to get fiber through food sources.  Always check the fiber content on thenutrition facts label of any prepackaged food. Look for foods that contain at least 5 grams of fiber per serving.  Ask your dietitian if you have questions about specific foods that are related to your condition, especially if those foods are not listed in the following section.  Increase your daily fiber consumption gradually. Increasing your intake of dietary fiber too quickly may cause bloating, cramping, or gas.  Drink plenty of water. Water helps you to digest fiber. What foods can I eat? Grains Whole-grain breads. Multigrain cereal. Oats and oatmeal. Brown rice. Barley. Bulgur wheat. [place]. Bran muffins. Popcorn. Rye wafer crackers. Vegetables Sweet potatoes. Spinach. Kale. Artichokes. Cabbage. Broccoli. Green peas. Carrots. Squash. Fruits Berries. Pears. Apples. Oranges. Avocados. Prunes and raisins. Dried figs. Meats and Other Protein Sources [place], kidney, pinto, and soy beans. Split peas. Lentils. Nuts and seeds. Dairy Fiber-fortified yogurt. Beverages Fiber-fortified soy milk. Fiber-fortified orange juice. Other Fiber bars. The items listed above may not be a complete list of recommended foods or beverages. Contact your dietitian for more options. What foods are not recommended? Grains White bread. Pasta made with refined flour. White rice. Vegetables Fried potatoes. Canned vegetables. Well-cooked vegetables. Fruits Fruit juice. Cooked, strained fruit. Meats and Other Protein Sources Fatty cuts of meat. Fried Sales executive or fried fish. Dairy Milk. Yogurt. Cream cheese. Sour cream. Beverages Soft drinks. Other Cakes and pastries. Butter and oils. The items listed above may not be a complete list of foods and  beverages to avoid. Contact your dietitian for more information. What are some tips for including high-fiber foods in my diet?  Eat a wide variety of  high-fiber foods.  Make sure that half of all grains consumed each day are whole grains.  Replace breads and cereals made from refined flour or white flour with whole-grain breads and cereals.  Replace white rice with brown rice, bulgur wheat, or millet.  Start the day with a breakfast that is high in fiber, such as a cereal that contains at least 5 grams of fiber per serving.  Use beans in place of meat in soups, salads, or pasta.  Eat high-fiber snacks, such as berries, raw vegetables, nuts, or popcorn. This information is not intended to replace advice given to you by your health care provider. Make sure you discuss any questions you have with your health care provider. Document Released: 01/17/2005 Document Revised: 06/25/2015 Document Reviewed: 07/02/2013 Elsevier Interactive Patient Education  2017 [place] RESUME ALL MEDICATIONS PER DR. FIELDS

## 2016-07-26 NOTE — Progress Notes (Signed)
Patient and wife requesting discharge instructions.  Advised patient and wife will let them sign and leave as soon as discharge instructions are ready from Doctor.  Offered drink, crackers while waiting, patient and wife declined. Wife and patient watching television while waiting.

## 2016-07-26 NOTE — Anesthesia Preprocedure Evaluation (Signed)
Anesthesia Evaluation  Patient identified by MRN, date of birth, ID band  Reviewed: reviewed documented beta blocker date and time   Airway Mallampati: I       Dental no notable dental hx.    Pulmonary shortness of breath, asthma , neg COPD, former smoker,    Pulmonary exam normal        Cardiovascular hypertension, Normal cardiovascular exam Rhythm:Regular Rate:Normal     Neuro/Psych  Headaches, PSYCHIATRIC DISORDERS    GI/Hepatic GERD  ,Fatty liver hx   Endo/Other  diabetes, Well Controlled, Type 2  Renal/GU      Musculoskeletal  (+) Arthritis ,   Abdominal Normal abdominal exam  (+)   Peds  Hematology  (+) anemia ,   Anesthesia Other Findings   Reproductive/Obstetrics                             Anesthesia Physical Anesthesia Plan  ASA: III  Anesthesia Plan: MAC   Post-op Pain Management:    Induction: Intravenous  PONV Risk Score and Plan:   Airway Management Planned: Nasal Cannula  Additional Equipment:   Intra-op Plan:   Post-operative Plan: Extubation in OR  Informed Consent: I have reviewed the patients History and Physical, chart, labs and discussed the procedure including the risks, benefits and alternatives for the proposed anesthesia with the patient or authorized representative who has indicated his/her understanding and acceptance.     Plan Discussed with: CRNA  Anesthesia Plan Comments:         Anesthesia Quick Evaluation

## 2016-07-26 NOTE — Op Note (Signed)
[facility] Patient Name: Bryan Rodgers Procedure Date: 07/26/2016 8:38 AM MRN: [id number] Date of Birth: 1960-04-19 Attending MD: Barney Drain , MD CSN: [id number] Age: 57 Admit Type: Outpatient Procedure:                Colonoscopy WITH COLD FORCEPS/SNARE POLYPECTOMY Indications:              Heme positive stool-LAST TCS 2007-LARGE IH/NO POLYPS Providers:                Barney Drain, MD, Rosina Lowenstein, RN, Randa Spike,                            Technician Referring MD:             Norwood Levo. Simpson MD, MD Medicines:                Propofol per Anesthesia Complications:            No immediate complications. Estimated Blood Loss:     Estimated blood loss was minimal. Procedure:                Pre-Anesthesia Assessment:                           - Prior to the procedure, a History and Physical                            was performed, and patient medications and                            allergies were reviewed. The patient's tolerance of                            previous anesthesia was also reviewed. The risks                            and benefits of the procedure and the sedation                            options and risks were discussed with the patient.                            All questions were answered, and informed consent                            was obtained. Prior Anticoagulants: The patient has                            taken aspirin, last dose was 1 day prior to                            procedure. ASA Grade Assessment: II - A patient                            with mild systemic disease. After reviewing the  risks and benefits, the patient was deemed in                            satisfactory condition to undergo the procedure.                            After obtaining informed consent, the colonoscope                            was passed under direct vision. Throughout the                            procedure, the  patient's blood pressure, pulse, and                            oxygen saturations were monitored continuously. The                            EC-[id number] ([id number]) scope was introduced through                            the anus and advanced to the the cecum, identified                            by appendiceal orifice and ileocecal valve. The                            colonoscopy was technically difficult and complex                            due to significant looping. Successful completion                            of the procedure was aided by applying abdominal                            pressure and COLOWRAP. The patient tolerated the                            procedure well. The quality of the bowel                            preparation was excellent. The ileocecal valve,                            appendiceal orifice, and rectum were photographed. Scope In: 8:44:23 AM Scope Out: 9:07:35 AM Scope Withdrawal Time: 0 hours 17 minutes 50 seconds  Total Procedure Duration: 0 hours 23 minutes 12 seconds  Findings:      Three sessile polyps were found in the splenic flexure. The polyps were       4 to 6 mm in size. These polyps were removed with a cold snare.       Resection and retrieval were complete.      A 3 mm polyp  was found in the splenic flexure. The polyp was sessile.       The polyp was removed with a cold biopsy forceps. Resection and       retrieval were complete.      Multiple small and large-mouthed diverticula were found in the       recto-sigmoid colon, sigmoid colon, descending colon, transverse colon       and ascending colon.      External and internal hemorrhoids were found during retroflexion. The       hemorrhoids were large. Impression:               - FOUR POLYPS REMOVED AND ARE LIKELY HYPERPLASTIC                            OR MUCOSAL PROLAPSE POLYPS                           - HEME POSITIVE STOOLS DUE TO INTERNAL HEMORRHOIDS Moderate Sedation:      Per  Anesthesia Care Recommendation:           - High fiber diet. LOSE WEIGHT.                           - Continue present medications.                           - Await pathology results.                           - Repeat colonoscopy in 5-10 years for                            surveillance. SEE SURGERY IF HE HAS PERSISTENT                            RECTAL PAIN/BLEEDING                           - Patient has a contact number available for                            emergencies. The signs and symptoms of potential                            delayed complications were discussed with the                            patient. Return to normal activities tomorrow.                            Written discharge instructions were provided to the                            patient. Procedure Code(s):        --- Professional ---  45385, Colonoscopy, flexible; with removal of                            tumor(s), polyp(s), or other lesion(s) by snare                            technique                           45380, 59, Colonoscopy, flexible; with biopsy,                            single or multiple Diagnosis Code(s):        --- Professional ---                           D12.3, Benign neoplasm of transverse colon (hepatic                            flexure or splenic flexure)                           R19.5, Other fecal abnormalities CPT copyright 2016 American [employer] Association. All rights reserved. The codes documented in this report are preliminary and upon coder review may  be revised to meet current compliance requirements. Barney Drain, MD Barney Drain, MD 07/26/2016 9:21:07 AM This report has been signed electronically. Number of Addenda: 0

## 2016-07-26 NOTE — Addendum Note (Signed)
Addendum  created 07/26/16 1504 by Mikey College, MD   Delete clinical note

## 2016-07-26 NOTE — Telephone Encounter (Signed)
Patient's wife called stating patient is at [facility] doing his colonoscopy today, and they would like to know if the lab work patient has already done will be good for Dr Moshe Cipro due to insurance patient does not know if it will be covered twice in one month. Please call Kaylene back at [phone]

## 2016-07-26 NOTE — Telephone Encounter (Signed)
Left message for patient

## 2016-07-26 NOTE — Transfer of Care (Signed)
Immediate Anesthesia Transfer of Care Note  Patient: DENG KEMLER  Procedure(s) Performed: Procedure(s) with comments: COLONOSCOPY WITH PROPOFOL (N/A) - 830  POLYPECTOMY - splenic flexure polyps x4  Patient Location: PACU  Anesthesia Type:MAC  Level of Consciousness: awake  Airway & Oxygen Therapy: Patient Spontanous Breathing and Patient connected to nasal cannula oxygen  Post-op Assessment: Report given to RN  Post vital signs: Reviewed and stable  Last Vitals:  Vitals:   07/26/16 0800 07/26/16 0805  BP:  130/77  Pulse:    Resp: (!) 23 (!) 28  Temp:      Last Pain:  Vitals:   07/26/16 0736  TempSrc: Oral      Patients Stated Pain Goal: 7 (93/71/69 6789)  Complications: No apparent anesthesia complications

## 2016-07-28 ENCOUNTER — Encounter (HOSPITAL_COMMUNITY): Payer: Self-pay | Admitting: Gastroenterology

## 2016-07-30 LAB — COMPLETE METABOLIC PANEL WITH GFR
ALT: 18 U/L (ref 9–46)
AST: 18 U/L (ref 10–35)
Albumin: 4.5 g/dL (ref 3.6–5.1)
Alkaline Phosphatase: 130 U/L — ABNORMAL HIGH (ref 40–115)
BILIRUBIN TOTAL: 0.6 mg/dL (ref 0.2–1.2)
BUN: 14 mg/dL (ref 7–25)
CALCIUM: 9.4 mg/dL (ref 8.6–10.3)
CO2: 22 mmol/L (ref 20–31)
Chloride: 106 mmol/L (ref 98–110)
Creat: 0.94 mg/dL (ref 0.70–1.33)
Glucose, Bld: 90 mg/dL (ref 65–99)
Potassium: 3.8 mmol/L (ref 3.5–5.3)
Sodium: 138 mmol/L (ref 135–146)
TOTAL PROTEIN: 7 g/dL (ref 6.1–8.1)

## 2016-07-30 LAB — LIPID PANEL
CHOL/HDL RATIO: 2.1 ratio (ref <5.0)
CHOLESTEROL: 86 mg/dL (ref <200)
HDL: 41 mg/dL (ref >40)
LDL Cholesterol: 36 mg/dL (ref <100)
Triglycerides: 45 mg/dL (ref <150)
VLDL: 9 mg/dL (ref <30)

## 2016-07-30 LAB — HEMOGLOBIN A1C
HEMOGLOBIN A1C: 6 % — AB (ref <5.7)
MEAN PLASMA GLUCOSE: 126 mg/dL

## 2016-08-02 ENCOUNTER — Other Ambulatory Visit: Payer: Self-pay | Admitting: Family Medicine

## 2016-08-02 NOTE — Telephone Encounter (Signed)
Seen 3 29 18

## 2016-08-04 ENCOUNTER — Ambulatory Visit: Payer: 59 | Admitting: Family Medicine

## 2016-08-04 ENCOUNTER — Encounter: Payer: Self-pay | Admitting: Family Medicine

## 2016-08-04 ENCOUNTER — Ambulatory Visit (INDEPENDENT_AMBULATORY_CARE_PROVIDER_SITE_OTHER): Payer: BLUE CROSS/BLUE SHIELD | Admitting: Family Medicine

## 2016-08-04 VITALS — BP 130/84 | HR 82 | Temp 97.8°F | Resp 16 | Ht 70.0 in | Wt 201.5 lb

## 2016-08-04 DIAGNOSIS — G43109 Migraine with aura, not intractable, without status migrainosus: Secondary | ICD-10-CM

## 2016-08-04 DIAGNOSIS — E119 Type 2 diabetes mellitus without complications: Secondary | ICD-10-CM | POA: Diagnosis not present

## 2016-08-04 DIAGNOSIS — K648 Other hemorrhoids: Secondary | ICD-10-CM

## 2016-08-04 DIAGNOSIS — R972 Elevated prostate specific antigen [PSA]: Secondary | ICD-10-CM | POA: Diagnosis not present

## 2016-08-04 DIAGNOSIS — M549 Dorsalgia, unspecified: Secondary | ICD-10-CM

## 2016-08-04 DIAGNOSIS — E663 Overweight: Secondary | ICD-10-CM

## 2016-08-04 DIAGNOSIS — K219 Gastro-esophageal reflux disease without esophagitis: Secondary | ICD-10-CM

## 2016-08-04 DIAGNOSIS — I10 Essential (primary) hypertension: Secondary | ICD-10-CM

## 2016-08-04 MED ORDER — TRAMADOL HCL 50 MG PO TABS: ORAL_TABLET | ORAL | 3 refills | Status: DC

## 2016-08-04 MED ORDER — OMEPRAZOLE 40 MG PO CPDR: DELAYED_RELEASE_CAPSULE | ORAL | 1 refills | Status: DC

## 2016-08-04 MED ORDER — BUDESONIDE-FORMOTEROL FUMARATE 160-4.5 MCG/ACT IN AERO: 2.0000 | INHALATION_SPRAY | Freq: Two times a day (BID) | RESPIRATORY_TRACT | 3 refills | Status: DC

## 2016-08-04 NOTE — Assessment & Plan Note (Signed)
No current flare 

## 2016-08-04 NOTE — Assessment & Plan Note (Signed)
Followed by urology.   

## 2016-08-04 NOTE — Assessment & Plan Note (Signed)
Controlled on daily PPI, continue same

## 2016-08-04 NOTE — Assessment & Plan Note (Signed)
Deteriorated. Patient re-educated about  the importance of commitment to a  minimum of 150 minutes of exercise per week.  The importance of healthy food choices with portion control discussed. Encouraged to start a food diary, count calories and to consider  joining a support group. Sample diet sheets offered. Goals set by the patient for the next several months.   Weight /BMI 08/04/2016 07/26/2016 07/21/2016  WEIGHT 201 lb 8 oz 199 lb 199 lb  HEIGHT 5\' 10"  5\' 10"  5\' 10"   BMI 28.91 kg/m2 28.55 kg/m2 28.55 kg/m2

## 2016-08-04 NOTE — Assessment & Plan Note (Signed)
Controlled, no change in medication  

## 2016-08-04 NOTE — Progress Notes (Signed)
Bryan Rodgers     MRN: [id number]      DOB: [birth year]   HPI Bryan Rodgers is here for follow up and re-evaluation of chronic medical conditions, medication management and review of any available recent lab and radiology data.  Preventive health is updated, specifically  Cancer screening and Immunization.   Questions or concerns regarding consultations or procedures which the PT has had in the interim are  addressed. The PT denies any adverse reactions to current medications since the last visit.  C/o swelling in left ear x 2 weeks , wants  To know if it is a tick or a hair bump, not painful   ROS Denies recent fever or chills. Denies sinus pressure, nasal congestion, ear pain or sore throat. Denies chest congestion, productive cough or wheezing. Denies chest pains, palpitations and leg swelling Denies abdominal pain, nausea, vomiting,diarrhea or constipation.   Denies dysuria, frequency, hesitancy or incontinence. Denies joint pain, swelling and limitation in mobility. Denies  Uncontrolled headaches, seizures, numbness, or tingling. Denies depression, anxiety or insomnia. Denies skin break down or rash.   PE  BP 130/84 (BP Location: Left Arm, Patient Position: Sitting)   Pulse 82   Temp 97.8 F (36.6 C)   Resp 16   Ht 5\' 10"  (1.778 m)   Wt 201 lb 8 oz (91.4 kg)   SpO2 97%   BMI 28.91 kg/m   Patient alert and oriented and in no cardiopulmonary distress.  HEENT: No facial asymmetry, EOMI,   oropharynx pink and moist.  Neck supple no JVD, no mass.tM clea bilaterally. Right ear examined and there is enlarged hair follicle in outer ear which is not inflamed  Chest: Clear to auscultation bilaterally.  CVS: S1, S2 no murmurs, no S3.Regular rate.  ABD: Soft non tender.   Ext: No edema  MS: Adequate ROM spine, shoulders, hips and knees.  Skin: Intact, no ulcerations or rash noted.  Psych: Good eye contact, normal affect. Memory intact not anxious or depressed  appearing.  CNS: CN 2-12 intact, power,  normal throughout.no focal deficits noted.   Assessment & Plan  HTN (hypertension) Controlled, no change in medication DASH diet and commitment to daily physical activity for a minimum of 30 minutes discussed and encouraged, as a part of hypertension management. The importance of attaining a healthy weight is also discussed.  BP/Weight 08/04/2016 07/26/2016 07/21/2016 06/08/2016 04/28/2016 09/03/2015 6/65/9935  Systolic BP 701 779 390 300 923 300 762  Diastolic BP 84 83 74 73 78 80 80  Wt. (Lbs) 201.5 199 199 191 203.12 204.4 205  BMI 28.91 28.55 28.55 27.41 29.14 29.33 29.41       Migraine headache Controlled on avg one to two per month, excellent response to maxalt  Type 2 diabetes, diet controlled [facility]) Bryan Rodgers is reminded of the importance of commitment to daily physical activity for 30 minutes or more, as able and the need to limit carbohydrate intake to 30 to 60 grams per meal to help with blood sugar control.   The need to take medication as prescribed, test blood sugar as directed, and to call between visits if there is a concern that blood sugar is uncontrolled is also discussed.   Bryan Rodgers is reminded of the importance of daily foot exam, annual eye examination, and good blood sugar, blood pressure and cholesterol control.  Diabetic Labs Latest Ref Rng & Units 07/29/2016 07/21/2016 09/03/2015 09/03/2015 09/03/2015  HbA1c <5.7 % 6.0(H) - - 6.1(H) -  Microalbumin  Not Estab. ug/mL - - 256.0(H) - 20.1  Micro/Creat Ratio 0.0 - 30.0 mg/g creat - - 191.8(H) - 153(H)  Chol <200 mg/dL 86 - - 95(L) -  HDL >40 mg/dL 41 - - 40 -  Calc LDL <100 mg/dL 36 - - 41 -  Triglycerides <150 mg/dL 45 - - 72 -  Creatinine 0.70 - 1.33 mg/dL 0.94 0.89 - 0.94 -   BP/Weight 08/04/2016 07/26/2016 07/21/2016 06/08/2016 04/28/2016 09/03/2015 8/87/5797  Systolic BP 282 060 156 153 794 327 614  Diastolic BP 84 83 74 73 78 80 80  Wt. (Lbs) 201.5 199 199 191 203.12 204.4 205   BMI 28.91 28.55 28.55 27.41 29.14 29.33 29.41   Foot/eye exam completion dates Latest Ref Rng & Units 04/28/2016 05/27/2015  Eye Exam No Retinopathy - No Retinopathy  Foot Form Completion - Done -        Overweight (BMI 25.0-29.9) Deteriorated. Patient re-educated about  the importance of commitment to a  minimum of 150 minutes of exercise per week.  The importance of healthy food choices with portion control discussed. Encouraged to start a food diary, count calories and to consider  joining a support group. Sample diet sheets offered. Goals set by the patient for the next several months.   Weight /BMI 08/04/2016 07/26/2016 07/21/2016  WEIGHT 201 lb 8 oz 199 lb 199 lb  HEIGHT 5\' 10"  5\' 10"  5\' 10"   BMI 28.91 kg/m2 28.55 kg/m2 28.55 kg/m2      Elevated PSA Followed by urology  GERD (gastroesophageal reflux disease) Controlled on daily PPI, continue same  Internal hemorrhoids No current flare  Back pain with radiation Controlled, no change in medication

## 2016-08-04 NOTE — Assessment & Plan Note (Signed)
Mr. Lineman is reminded of the importance of commitment to daily physical activity for 30 minutes or more, as able and the need to limit carbohydrate intake to 30 to 60 grams per meal to help with blood sugar control.   The need to take medication as prescribed, test blood sugar as directed, and to call between visits if there is a concern that blood sugar is uncontrolled is also discussed.   Mr. Pezzullo is reminded of the importance of daily foot exam, annual eye examination, and good blood sugar, blood pressure and cholesterol control.  Diabetic Labs Latest Ref Rng & Units 07/29/2016 07/21/2016 09/03/2015 09/03/2015 09/03/2015  HbA1c <5.7 % 6.0(H) - - 6.1(H) -  Microalbumin Not Estab. ug/mL - - 256.0(H) - 20.1  Micro/Creat Ratio 0.0 - 30.0 mg/g creat - - 191.8(H) - 153(H)  Chol <200 mg/dL 86 - - 95(L) -  HDL >40 mg/dL 41 - - 40 -  Calc LDL <100 mg/dL 36 - - 41 -  Triglycerides <150 mg/dL 45 - - 72 -  Creatinine 0.70 - 1.33 mg/dL 0.94 0.89 - 0.94 -   BP/Weight 08/04/2016 07/26/2016 07/21/2016 06/08/2016 04/28/2016 09/03/2015 07/31/4101  Systolic BP 013 143 888 757 972 820 601  Diastolic BP 84 83 74 73 78 80 80  Wt. (Lbs) 201.5 199 199 191 203.12 204.4 205  BMI 28.91 28.55 28.55 27.41 29.14 29.33 29.41   Foot/eye exam completion dates Latest Ref Rng & Units 04/28/2016 05/27/2015  Eye Exam No Retinopathy - No Retinopathy  Foot Form Completion - Done -

## 2016-08-04 NOTE — Assessment & Plan Note (Signed)
Controlled on avg one to two per month, excellent response to maxalt

## 2016-08-04 NOTE — Patient Instructions (Signed)
F/u in 6 month, callif you need me before  No changes in medication  Please work on good  health habits so that your health will improve. 1. Commitment to daily physical activity for 30 to 60  minutes, if you are able to do this.  2. Commitment to wise food choices. Aim for half of your  food intake to be vegetable and fruit, one quarter starchy foods, and one quarter protein. Try to eat on a regular schedule  3 meals per day, snacking between meals should be limited to vegetables or fruits or small portions of nuts. 64 ounces of water per day is generally recommended, unless you have specific health conditions, like heart failure or kidney failure where you will need to limit fluid intake.  3. Commitment to sufficient and a  good quality of physical and mental rest daily, generally between 6 to 8 hours per day.  WITH PERSISTANCE AND PERSEVERANCE, THE IMPOSSIBLE , BECOMES THE NORM! It is important that you exercise regularly at least 30 minutes 5 times a week. If you develop chest pain, have severe difficulty breathing, or feel very tired, stop exercising immediately and seek medical attention  Labs are excellent as, is your blood pressure  Thank you  for choosing [facility]. We consider it a privelige to serve you.  Delivering excellent health care in a caring and  compassionate way is our goal.  Partnering with you,  so that together we can achieve this goal is our strategy.

## 2016-08-04 NOTE — Assessment & Plan Note (Signed)
Controlled, no change in medication DASH diet and commitment to daily physical activity for a minimum of 30 minutes discussed and encouraged, as a part of hypertension management. The importance of attaining a healthy weight is also discussed.  BP/Weight 08/04/2016 07/26/2016 07/21/2016 06/08/2016 04/28/2016 09/03/2015 6/43/3295  Systolic BP 188 416 606 301 601 093 235  Diastolic BP 84 83 74 73 78 80 80  Wt. (Lbs) 201.5 199 199 191 203.12 204.4 205  BMI 28.91 28.55 28.55 27.41 29.14 29.33 29.41

## 2016-08-05 ENCOUNTER — Other Ambulatory Visit: Payer: Self-pay | Admitting: Family Medicine

## 2016-08-07 ENCOUNTER — Telehealth: Payer: Self-pay | Admitting: Gastroenterology

## 2016-08-07 NOTE — Telephone Encounter (Signed)
Please call pt. He had ONE simple adenoma,ON HYPERPLASTIC, AND TWO BENIGN POLYPOID LESIONs removed. FOLLOW A High fiber diet. NEXT TCS in 5-10 years.

## 2016-08-08 NOTE — Telephone Encounter (Signed)
Phone number does not work

## 2016-08-08 NOTE — Telephone Encounter (Signed)
Letter mailed to pt to call.  

## 2016-08-08 NOTE — Telephone Encounter (Signed)
ON RECALL  °

## 2016-09-13 ENCOUNTER — Other Ambulatory Visit: Payer: Self-pay | Admitting: Family Medicine

## 2016-09-16 NOTE — Progress Notes (Signed)
REVIEWED-NO ADDITIONAL RECOMMENDATIONS. 

## 2016-10-03 IMAGING — DX DG CHEST 2V
2 series · 2 of 2 positions shown · non-contrast
Comparison: 05/07/2015

CLINICAL DATA: Followup pneumonia

EXAM:
CHEST  2 VIEW

[chest pa]
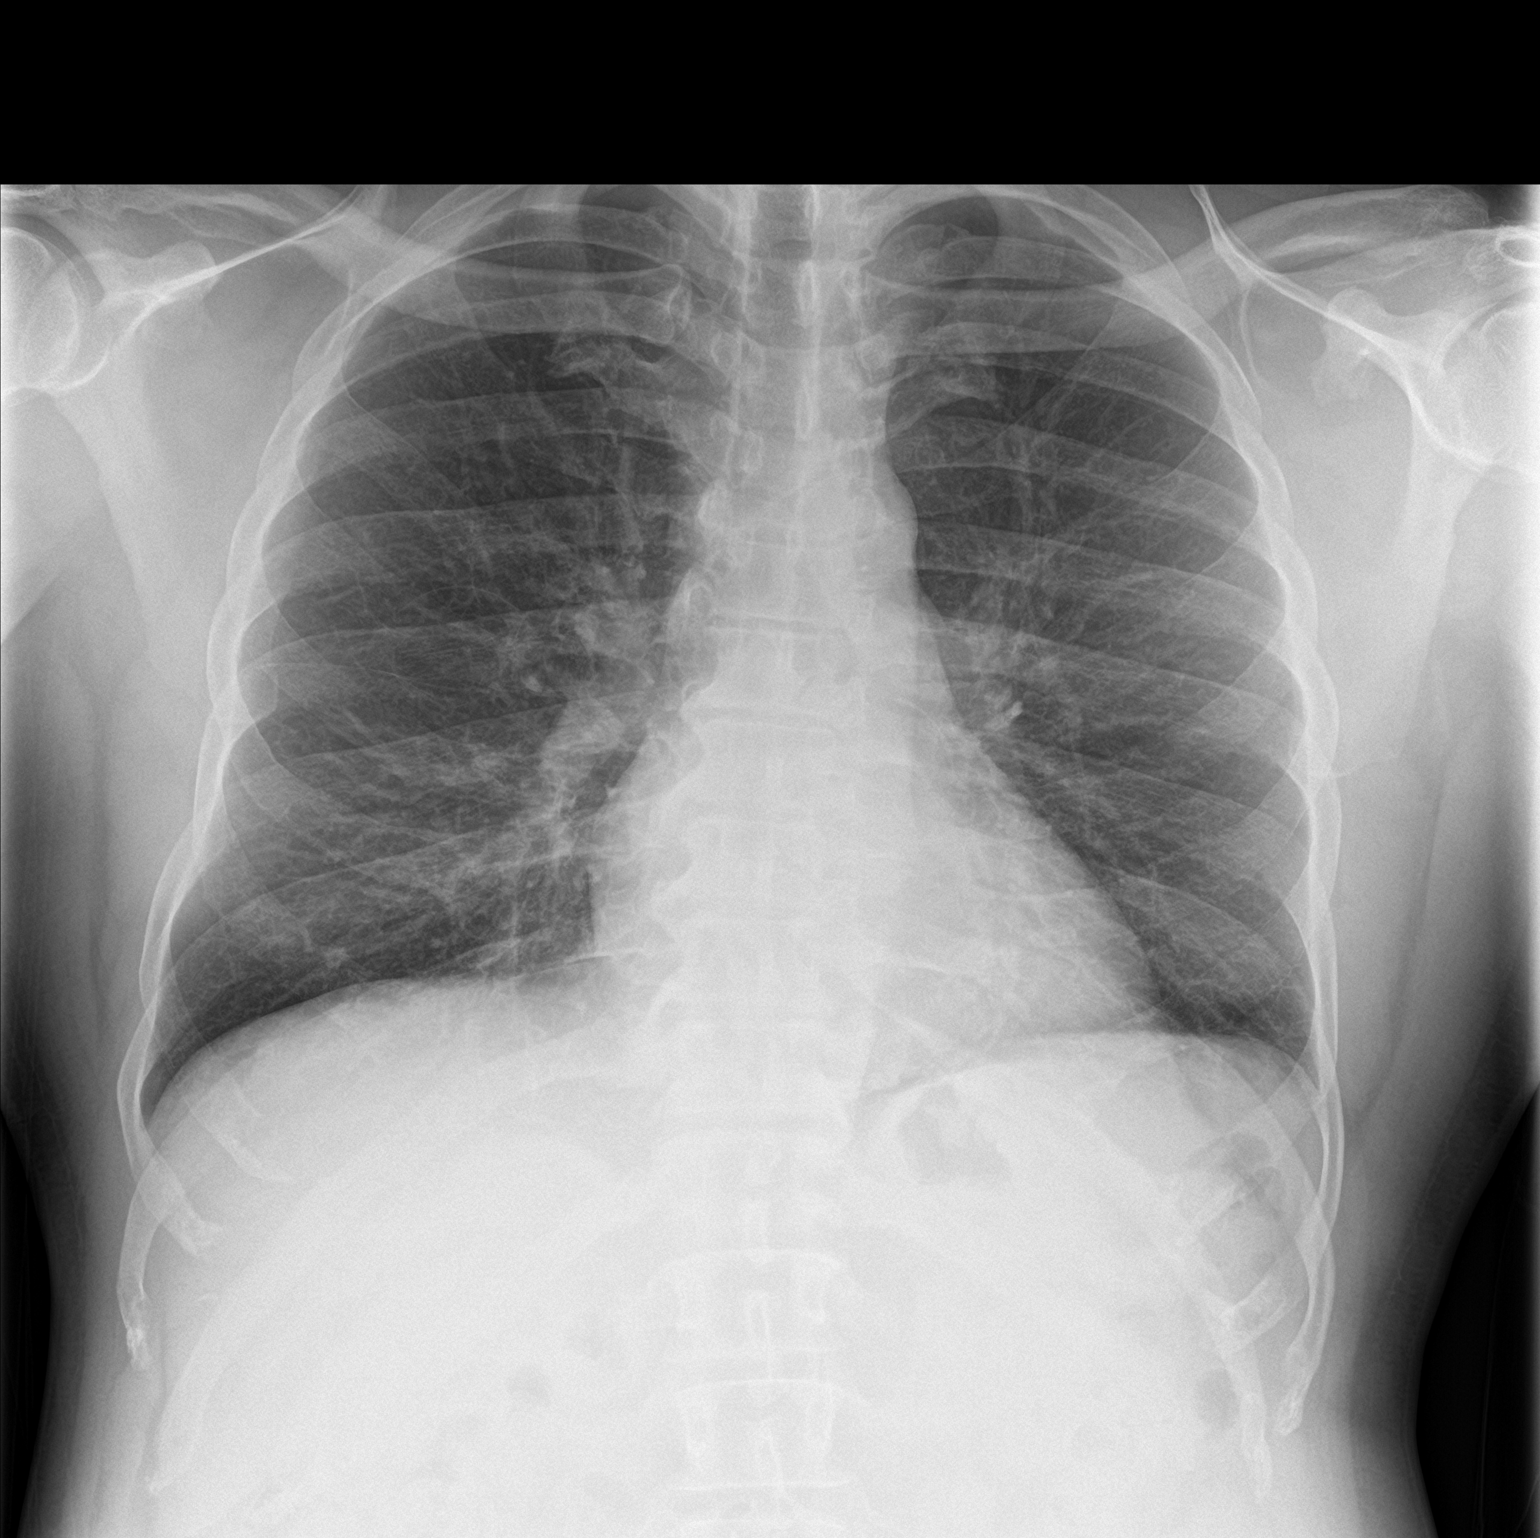

[chest lat]
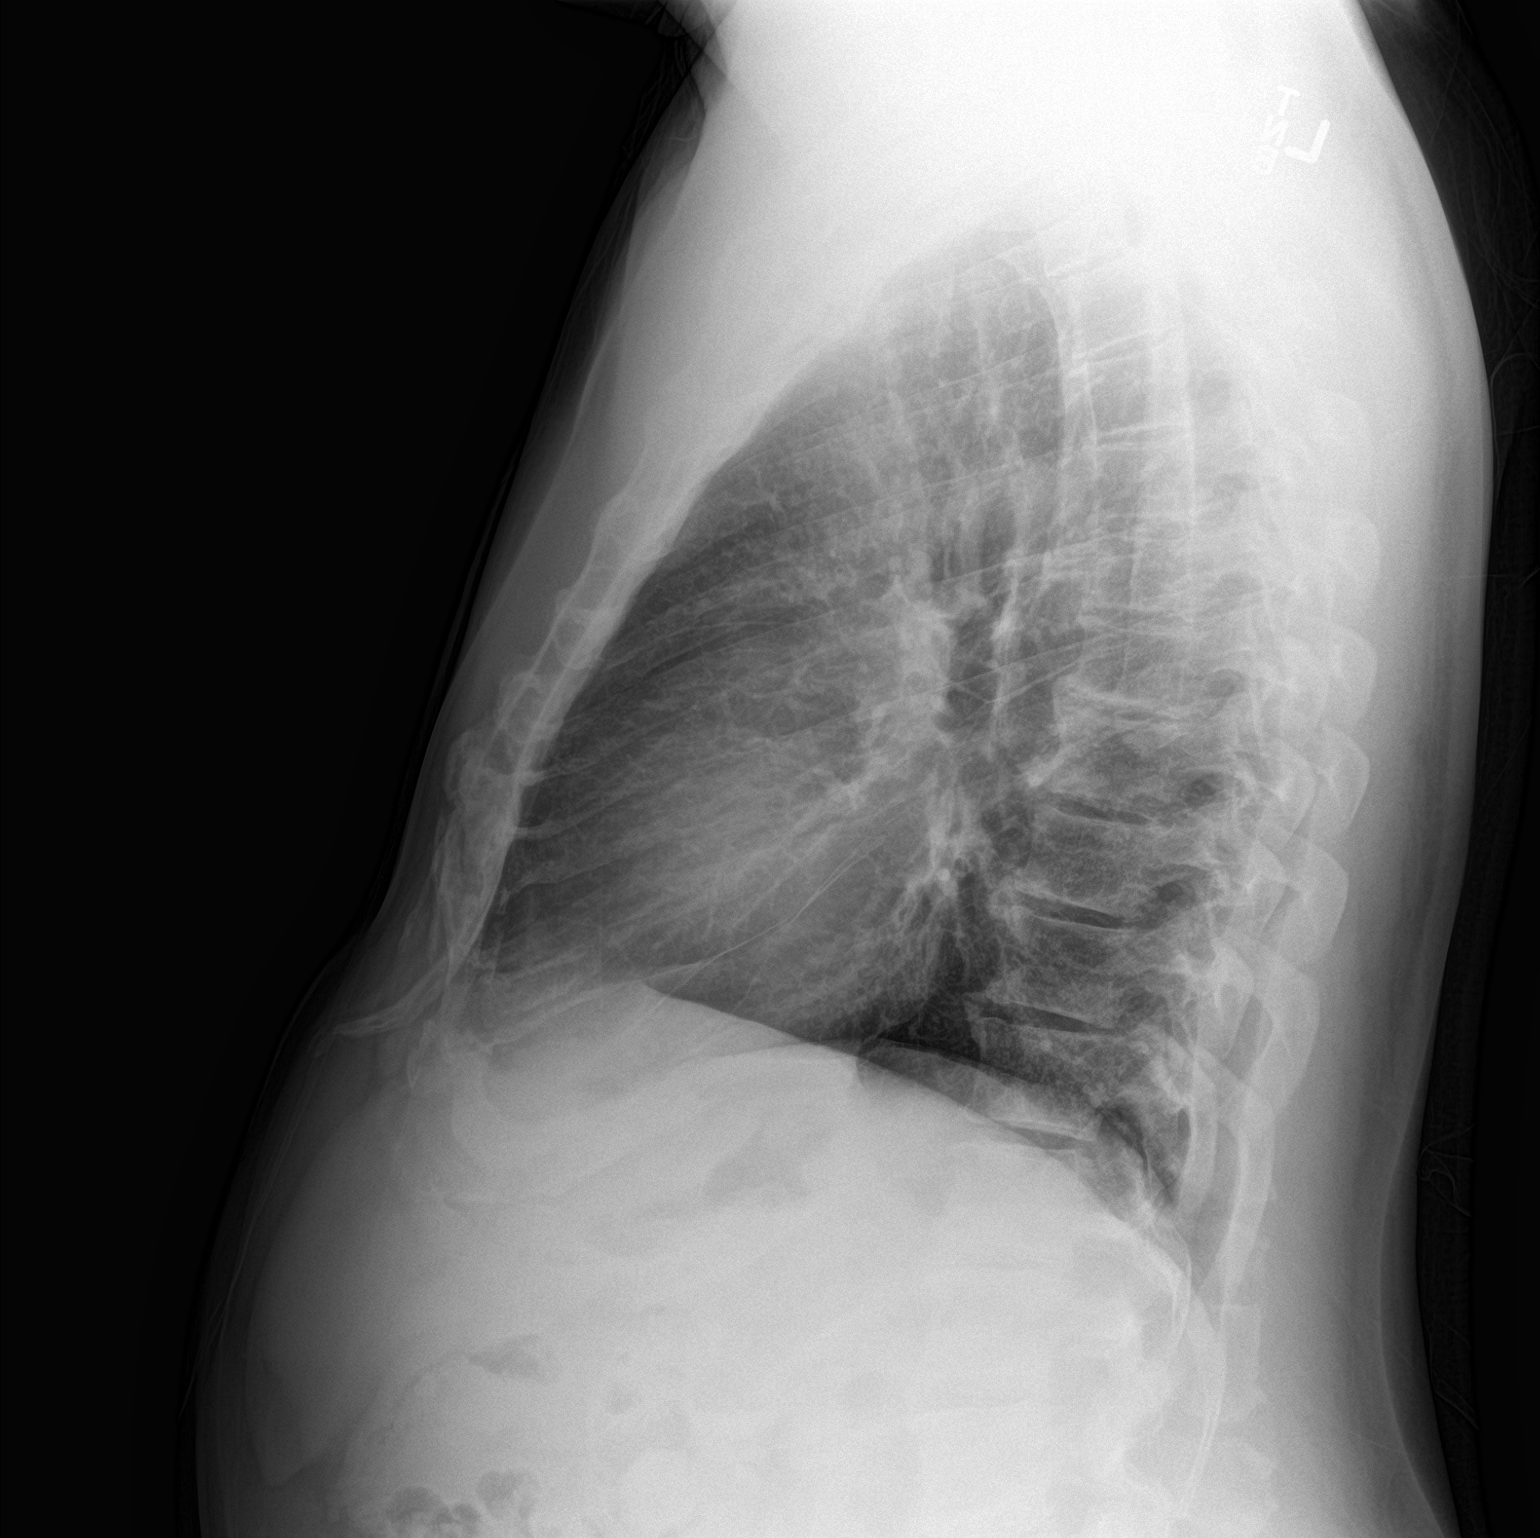

[2 of 2 positions shown; findings below may reference images not displayed]

FINDINGS: The heart size and mediastinal contours are within normal limits.
Both lungs are clear. Spondylosis noted within the thoracic spine.
IMPRESSION: 1. No acute cardiopulmonary abnormalities.
2. Thoracic spondylosis noted.

## 2016-11-30 ENCOUNTER — Other Ambulatory Visit: Payer: Self-pay | Admitting: Family Medicine

## 2017-01-06 ENCOUNTER — Other Ambulatory Visit: Payer: Self-pay | Admitting: Family Medicine

## 2017-01-11 ENCOUNTER — Other Ambulatory Visit: Payer: Self-pay

## 2017-01-11 MED ORDER — TRAMADOL HCL 50 MG PO TABS: ORAL_TABLET | ORAL | 0 refills | Status: DC

## 2017-01-12 ENCOUNTER — Other Ambulatory Visit: Payer: Self-pay | Admitting: Family Medicine

## 2017-01-30 ENCOUNTER — Other Ambulatory Visit: Payer: Self-pay | Admitting: Family Medicine

## 2017-02-20 ENCOUNTER — Other Ambulatory Visit: Payer: Self-pay

## 2017-02-20 MED ORDER — TOPIRAMATE 25 MG PO TABS: 25.0000 mg | ORAL_TABLET | Freq: Two times a day (BID) | ORAL | 5 refills | Status: DC

## 2017-03-01 ENCOUNTER — Encounter: Payer: Self-pay | Admitting: Family Medicine

## 2017-03-01 ENCOUNTER — Ambulatory Visit: Payer: BLUE CROSS/BLUE SHIELD | Admitting: Family Medicine

## 2017-03-01 VITALS — BP 120/70 | HR 75 | Temp 98.0°F | Ht 70.0 in | Wt 206.0 lb

## 2017-03-01 DIAGNOSIS — E663 Overweight: Secondary | ICD-10-CM

## 2017-03-01 DIAGNOSIS — E559 Vitamin D deficiency, unspecified: Secondary | ICD-10-CM | POA: Diagnosis not present

## 2017-03-01 DIAGNOSIS — E1169 Type 2 diabetes mellitus with other specified complication: Secondary | ICD-10-CM | POA: Diagnosis not present

## 2017-03-01 DIAGNOSIS — I10 Essential (primary) hypertension: Secondary | ICD-10-CM | POA: Diagnosis not present

## 2017-03-01 DIAGNOSIS — Z23 Encounter for immunization: Secondary | ICD-10-CM | POA: Diagnosis not present

## 2017-03-01 DIAGNOSIS — R972 Elevated prostate specific antigen [PSA]: Secondary | ICD-10-CM | POA: Diagnosis not present

## 2017-03-01 DIAGNOSIS — G43109 Migraine with aura, not intractable, without status migrainosus: Secondary | ICD-10-CM | POA: Diagnosis not present

## 2017-03-01 DIAGNOSIS — E119 Type 2 diabetes mellitus without complications: Secondary | ICD-10-CM

## 2017-03-01 DIAGNOSIS — E785 Hyperlipidemia, unspecified: Secondary | ICD-10-CM | POA: Diagnosis not present

## 2017-03-01 MED ORDER — TRAMADOL HCL 50 MG PO TABS: 50.0000 mg | ORAL_TABLET | Freq: Three times a day (TID) | ORAL | 5 refills | Status: DC | PRN

## 2017-03-01 MED ORDER — ALBUTEROL SULFATE HFA 108 (90 BASE) MCG/ACT IN AERS: 2.0000 | INHALATION_SPRAY | Freq: Four times a day (QID) | RESPIRATORY_TRACT | 5 refills | Status: DC | PRN

## 2017-03-01 MED ORDER — TEMAZEPAM 15 MG PO CAPS: ORAL_CAPSULE | ORAL | 4 refills | Status: DC

## 2017-03-01 MED ORDER — RIZATRIPTAN BENZOATE 10 MG PO TBDP
ORAL_TABLET | ORAL | 3 refills | Status: DC
Start: 2017-03-01 — End: 2017-07-28

## 2017-03-01 MED ORDER — TOPIRAMATE 25 MG PO TABS: ORAL_TABLET | ORAL | 5 refills | Status: DC

## 2017-03-01 NOTE — Progress Notes (Signed)
Bryan Rodgers     MRN: [id number]      DOB: 1960-06-30   HPI Bryan Rodgers is here for follow up and re-evaluation of chronic medical conditions, medication management and review of any available recent lab and radiology data.  Preventive health is updated, specifically  Cancer screening and Immunization.   Questions or concerns regarding consultations or procedures which the PT has had in the interim are  addressed. The PT denies any adverse reactions to current medications since the last visit.  Has headache every other night, needs dose increase in preventive medication ROS Denies recent fever or chills. Denies sinus pressure, nasal congestion, ear pain or sore throat. Denies chest congestion, productive cough or wheezing. Denies chest pains, palpitations and leg swelling Denies abdominal pain, nausea, vomiting,diarrhea or constipation.   Denies dysuria, frequency, hesitancy or incontinence. Denies uncontrolled  joint pain, swelling and limitation in mobility. Denies  seizures, numbness, or tingling. Denies depression, anxiety or insomnia. Denies skin break down or rash.   PE  BP 120/70   Pulse 75   Temp 98 F (36.7 C)   Ht 5\' 10"  (1.778 m)   Wt 206 lb (93.4 kg)   SpO2 94%   BMI 29.56 kg/m   Patient alert and oriented and in no cardiopulmonary distress.  HEENT: No facial asymmetry, EOMI,   oropharynx pink and moist.  Neck supple no JVD, no mass.  Chest: Clear to auscultation bilaterally.  CVS: S1, S2 no murmurs, no S3.Regular rate.  ABD: Soft non tender.   Ext: No edema  MS: Adequate ROM spine, shoulders, hips and knees.  Skin: Intact, no ulcerations or rash noted.  Psych: Good eye contact, normal affect. Memory intact not anxious or depressed appearing.  CNS: CN 2-12 intact, power,  normal throughout.no focal deficits noted.   Assessment & Plan  HTN (hypertension) Controlled, no change in medication DASH diet and commitment to daily physical activity  for a minimum of 30 minutes discussed and encouraged, as a part of hypertension management. The importance of attaining a healthy weight is also discussed.  BP/Weight 03/01/2017 08/04/2016 07/26/2016 07/21/2016 06/08/2016 7/32/2025 05/02/7060  Systolic BP 376 283 151 761 607 371 062  Diastolic BP 70 84 83 74 73 78 80  Wt. (Lbs) 206 201.5 199 199 191 203.12 204.4  BMI 29.56 28.91 28.55 28.55 27.41 29.14 29.33       Migraine headache Uncontrolled, inc rase topamax, and continue Maxalt fore headches  Type 2 diabetes, diet controlled [facility]) Bryan Rodgers is reminded of the importance of commitment to daily physical activity for 30 minutes or more, as able and the need to limit carbohydrate intake to 30 to 60 grams per meal to help with blood sugar control.   .   Bryan Rodgers is reminded of the importance of daily foot exam, annual eye examination, and good blood sugar, blood pressure and cholesterol control. Deteriorated , needs to reduce carb intake and  Increase exercise and lose weight   Diabetic Labs Latest Ref Rng & Units 03/01/2017 07/29/2016 07/21/2016 09/03/2015 09/03/2015  HbA1c <5.7 % of total Hgb 6.2(H) 6.0(H) - - 6.1(H)  Microalbumin Not Estab. ug/mL - - - 256.0(H) -  Micro/Creat Ratio 0.0 - 30.0 mg/g creat - - - 191.8(H) -  Chol <200 mg/dL 140 86 - - 95(L)  HDL >40 mg/dL 28(L) 41 - - 40  Calc LDL <100 mg/dL - 36 - - 41  Triglycerides <150 mg/dL 256(H) 45 - - 72  Creatinine  0.70 - 1.33 mg/dL 0.91 0.94 0.89 - 0.94   BP/Weight 03/01/2017 08/04/2016 07/26/2016 07/21/2016 06/08/2016 5/36/6440 04/04/7423  Systolic BP 956 387 564 332 951 884 166  Diastolic BP 70 84 83 74 73 78 80  Wt. (Lbs) 206 201.5 199 199 191 203.12 204.4  BMI 29.56 28.91 28.55 28.55 27.41 29.14 29.33   Foot/eye exam completion dates Latest Ref Rng & Units 04/28/2016 05/27/2015  Eye Exam No Retinopathy - No Retinopathy  Foot Form Completion - Done -        Vitamin D deficiency Needs to commit to weekly vitamin D  Elevated  PSA Managed by urology, does not have cancer  Overweight (BMI 25.0-29.9) Deteriorated. Patient re-educated about  the importance of commitment to a  minimum of 150 minutes of exercise per week.  The importance of healthy food choices with portion control discussed. Encouraged to start a food diary, count calories and to consider  joining a support group. Sample diet sheets offered. Goals set by the patient for the next several months.   Weight /BMI 03/01/2017 08/04/2016 07/26/2016  WEIGHT 206 lb 201 lb 8 oz 199 lb  HEIGHT 5\' 10"  5\' 10"  5\' 10"   BMI 29.56 kg/m2 28.91 kg/m2 28.55 kg/m2

## 2017-03-01 NOTE — Patient Instructions (Signed)
Annual physical exam end April, call if you need me sooner  Flu vaccine today  Fasting labs today  Increase in topamax to three tablets daily, one in the morning and two at night   Start restoril one at bedtime to help with sleep, and practice good sleep hygiene. Turn off all light and sound when sleeping, and sleep in your bed with room at a cool temperature

## 2017-03-02 ENCOUNTER — Other Ambulatory Visit: Payer: Self-pay | Admitting: Family Medicine

## 2017-03-02 LAB — COMPREHENSIVE METABOLIC PANEL
AG RATIO: 1.5 (calc) (ref 1.0–2.5)
ALKALINE PHOSPHATASE (APISO): 141 U/L — AB (ref 40–115)
ALT: 21 U/L (ref 9–46)
AST: 20 U/L (ref 10–35)
Albumin: 4.6 g/dL (ref 3.6–5.1)
BUN: 14 mg/dL (ref 7–25)
CALCIUM: 9.8 mg/dL (ref 8.6–10.3)
CHLORIDE: 110 mmol/L (ref 98–110)
CO2: 22 mmol/L (ref 20–32)
CREATININE: 0.91 mg/dL (ref 0.70–1.33)
GLOBULIN: 3 g/dL (ref 1.9–3.7)
Glucose, Bld: 100 mg/dL — ABNORMAL HIGH (ref 65–99)
Potassium: 4.3 mmol/L (ref 3.5–5.3)
Sodium: 139 mmol/L (ref 135–146)
Total Bilirubin: 0.6 mg/dL (ref 0.2–1.2)
Total Protein: 7.6 g/dL (ref 6.1–8.1)

## 2017-03-02 LAB — HEMOGLOBIN A1C
HEMOGLOBIN A1C: 6.2 %{Hb} — AB (ref <5.7)
Mean Plasma Glucose: 131 (calc)
eAG (mmol/L): 7.3 (calc)

## 2017-03-02 LAB — LIPID PANEL
Cholesterol: 140 mg/dL (ref <200)
HDL: 28 mg/dL — ABNORMAL LOW (ref >40)
LDL Cholesterol (Calc): 78 mg/dL (calc)
NON-HDL CHOLESTEROL (CALC): 112 mg/dL (ref <130)
Total CHOL/HDL Ratio: 5 (calc) — ABNORMAL HIGH (ref <5.0)
Triglycerides: 256 mg/dL — ABNORMAL HIGH (ref <150)

## 2017-03-02 LAB — TSH: TSH: 0.6 mIU/L (ref 0.40–4.50)

## 2017-03-02 LAB — VITAMIN D 25 HYDROXY (VIT D DEFICIENCY, FRACTURES): Vit D, 25-Hydroxy: 12 ng/mL — ABNORMAL LOW (ref 30–100)

## 2017-03-02 MED ORDER — ERGOCALCIFEROL 1.25 MG (50000 UT) PO CAPS: 50000.0000 [IU] | ORAL_CAPSULE | ORAL | 3 refills | Status: DC

## 2017-03-02 MED ORDER — PRAVASTATIN SODIUM 20 MG PO TABS: 20.0000 mg | ORAL_TABLET | Freq: Every day | ORAL | 5 refills | Status: DC

## 2017-03-02 NOTE — Progress Notes (Signed)
prfavachol

## 2017-03-05 ENCOUNTER — Encounter: Payer: Self-pay | Admitting: Family Medicine

## 2017-03-05 DIAGNOSIS — E1169 Type 2 diabetes mellitus with other specified complication: Secondary | ICD-10-CM | POA: Insufficient documentation

## 2017-03-05 DIAGNOSIS — E785 Hyperlipidemia, unspecified: Secondary | ICD-10-CM

## 2017-03-05 NOTE — Assessment & Plan Note (Signed)
Bryan Rodgers is reminded of the importance of commitment to daily physical activity for 30 minutes or more, as able and the need to limit carbohydrate intake to 30 to 60 grams per meal to help with blood sugar control.   .   Bryan Rodgers is reminded of the importance of daily foot exam, annual eye examination, and good blood sugar, blood pressure and cholesterol control. Deteriorated , needs to reduce carb intake and  Increase exercise and lose weight   Diabetic Labs Latest Ref Rng & Units 03/01/2017 07/29/2016 07/21/2016 09/03/2015 09/03/2015  HbA1c <5.7 % of total Hgb 6.2(H) 6.0(H) - - 6.1(H)  Microalbumin Not Estab. ug/mL - - - 256.0(H) -  Micro/Creat Ratio 0.0 - 30.0 mg/g creat - - - 191.8(H) -  Chol <200 mg/dL 140 86 - - 95(L)  HDL >40 mg/dL 28(L) 41 - - 40  Calc LDL <100 mg/dL - 36 - - 41  Triglycerides <150 mg/dL 256(H) 45 - - 72  Creatinine 0.70 - 1.33 mg/dL 0.91 0.94 0.89 - 0.94   BP/Weight 03/01/2017 08/04/2016 07/26/2016 07/21/2016 06/08/2016 9/73/5329 10/03/4266  Systolic BP 341 962 229 798 921 194 174  Diastolic BP 70 84 83 74 73 78 80  Wt. (Lbs) 206 201.5 199 199 191 203.12 204.4  BMI 29.56 28.91 28.55 28.55 27.41 29.14 29.33   Foot/eye exam completion dates Latest Ref Rng & Units 04/28/2016 05/27/2015  Eye Exam No Retinopathy - No Retinopathy  Foot Form Completion - Done -

## 2017-03-05 NOTE — Assessment & Plan Note (Signed)
Needs to commit to weekly vitamin D

## 2017-03-05 NOTE — Assessment & Plan Note (Signed)
Controlled, no change in medication DASH diet and commitment to daily physical activity for a minimum of 30 minutes discussed and encouraged, as a part of hypertension management. The importance of attaining a healthy weight is also discussed.  BP/Weight 03/01/2017 08/04/2016 07/26/2016 07/21/2016 06/08/2016 2/77/4128 08/07/6765  Systolic BP 209 470 962 836 629 476 546  Diastolic BP 70 84 83 74 73 78 80  Wt. (Lbs) 206 201.5 199 199 191 203.12 204.4  BMI 29.56 28.91 28.55 28.55 27.41 29.14 29.33

## 2017-03-05 NOTE — Assessment & Plan Note (Signed)
Managed by urology, does not have cancer

## 2017-03-05 NOTE — Assessment & Plan Note (Signed)
Uncontrolled, inc rase topamax, and continue Maxalt fore headches

## 2017-03-05 NOTE — Assessment & Plan Note (Signed)
Deteriorated. Patient re-educated about  the importance of commitment to a  minimum of 150 minutes of exercise per week.  The importance of healthy food choices with portion control discussed. Encouraged to start a food diary, count calories and to consider  joining a support group. Sample diet sheets offered. Goals set by the patient for the next several months.   Weight /BMI 03/01/2017 08/04/2016 07/26/2016  WEIGHT 206 lb 201 lb 8 oz 199 lb  HEIGHT 5\' 10"  5\' 10"  5\' 10"   BMI 29.56 kg/m2 28.91 kg/m2 28.55 kg/m2

## 2017-03-09 ENCOUNTER — Encounter: Payer: Self-pay | Admitting: Family Medicine

## 2017-03-28 ENCOUNTER — Telehealth: Payer: Self-pay

## 2017-03-28 ENCOUNTER — Other Ambulatory Visit: Payer: Self-pay | Admitting: Family Medicine

## 2017-03-28 MED ORDER — SIMVASTATIN 20 MG PO TABS: 20.0000 mg | ORAL_TABLET | Freq: Every day | ORAL | 5 refills | Status: DC

## 2017-03-28 NOTE — Telephone Encounter (Signed)
Wife states that Bryan Rodgers cannot take the cholesterol med that was given to him at his appt because he does something to his eyes. Wants to go back on the zocor that he has taken before

## 2017-03-28 NOTE — Telephone Encounter (Signed)
Med sent in.

## 2017-04-13 ENCOUNTER — Other Ambulatory Visit: Payer: Self-pay | Admitting: Family Medicine

## 2017-05-01 ENCOUNTER — Other Ambulatory Visit: Payer: Self-pay | Admitting: Family Medicine

## 2017-05-24 ENCOUNTER — Encounter: Payer: BLUE CROSS/BLUE SHIELD | Admitting: Family Medicine

## 2017-05-30 ENCOUNTER — Encounter: Payer: BLUE CROSS/BLUE SHIELD | Admitting: Family Medicine

## 2017-07-15 ENCOUNTER — Other Ambulatory Visit: Payer: Self-pay | Admitting: Family Medicine

## 2017-07-25 ENCOUNTER — Other Ambulatory Visit: Payer: Self-pay

## 2017-07-25 ENCOUNTER — Encounter: Payer: Self-pay | Admitting: Family Medicine

## 2017-07-25 ENCOUNTER — Ambulatory Visit (INDEPENDENT_AMBULATORY_CARE_PROVIDER_SITE_OTHER): Payer: BLUE CROSS/BLUE SHIELD | Admitting: Family Medicine

## 2017-07-25 VITALS — BP 116/64 | HR 60 | Ht 70.0 in | Wt 199.1 lb

## 2017-07-25 DIAGNOSIS — Z0000 Encounter for general adult medical examination without abnormal findings: Secondary | ICD-10-CM | POA: Diagnosis not present

## 2017-07-25 DIAGNOSIS — F5101 Primary insomnia: Secondary | ICD-10-CM

## 2017-07-25 DIAGNOSIS — E1169 Type 2 diabetes mellitus with other specified complication: Secondary | ICD-10-CM

## 2017-07-25 DIAGNOSIS — E663 Overweight: Secondary | ICD-10-CM | POA: Diagnosis not present

## 2017-07-25 DIAGNOSIS — E785 Hyperlipidemia, unspecified: Secondary | ICD-10-CM

## 2017-07-25 DIAGNOSIS — I10 Essential (primary) hypertension: Secondary | ICD-10-CM

## 2017-07-25 DIAGNOSIS — E119 Type 2 diabetes mellitus without complications: Secondary | ICD-10-CM

## 2017-07-25 MED ORDER — TEMAZEPAM 30 MG PO CAPS: 30.0000 mg | ORAL_CAPSULE | Freq: Every evening | ORAL | 5 refills | Status: DC | PRN

## 2017-07-25 NOTE — Patient Instructions (Addendum)
F/Un in 6 months, call if you need me before  Please get fasting lipid, cmp and EGFGR, HBA1C, and CBC as soon as possible  It is important that you exercise regularly at least 30 minutes 5 times a week. If you develop chest pain, have severe difficulty breathing, or feel very tired, stop exercising immediately and seek medical attention     Please work on good  health habits so that your health will improve. 1. Commitment to daily physical activity for 30 to 60  minutes, if you are able to do this.  2. Commitment to wise food choices. Aim for half of your  food intake to be vegetable and fruit, one quarter starchy foods, and one quarter protein. Try to eat on a regular schedule  3 meals per day, snacking between meals should be limited to vegetables or fruits or small portions of nuts. 64 ounces of water per day is generally recommended, unless you have specific health conditions, like heart failure or kidney failure where you will need to limit fluid intake.  3. Commitment to sufficient and a  good quality of physical and mental rest daily, generally between 6 to 8 hours per day.  WITH PERSISTANCE AND PERSEVERANCE, THE IMPOSSIBLE , BECOMES THE NORM!

## 2017-07-25 NOTE — Progress Notes (Signed)
Bryan Rodgers     MRN: [id number]      DOB: 09-Oct-1960   HPI: Patient is in for annual physical exam. Requests dose increase in his temazepam due to poor sleep on current dose  Recent labs, if available are reviewed. Immunization is reviewed , and  Is up to date    PE; BP 116/64 (BP Location: Left Arm, Patient Position: Sitting, Cuff Size: Large)   Pulse 60   Ht 5\' 10"  (1.778 m)   Wt 199 lb 1.9 oz (90.3 kg)   SpO2 98%   BMI 28.57 kg/m   Pleasant male, alert and oriented x 3, in no cardio-pulmonary distress. Afebrile. HEENT No facial trauma or asymetry. Sinuses non tender. EOMI, pupils equally reactive to light. External ears normal, tympanic membranes partially occluded by cerumen bilaterally. Oropharynx moist, no exudate. Neck: supple, no adenopathy,JVD or thyromegaly.No bruits.  Chest: Clear to ascultation bilaterally.No crackles or wheezes. Non tender to palpation  Breast: No asymetry,no masses. No nipple discharge or inversion. No axillary or supraclavicular adenopathy  Cardiovascular system; Heart sounds normal,  S1 and  S2 ,no S3.  No murmur, or thrill. Apical beat not displaced Peripheral pulses normal.  Abdomen: Soft, non tender, no organomegaly or masses. No bruits. Bowel sounds normal. No guarding, tenderness or rebound.  Rectal:  Not indicated , colonoscopy in 2019, multiple polyps, needs repeat in 3 years   Musculoskeletal exam: Full ROM of spine, hips , shoulders and knees. No deformity ,swelling or crepitus noted. No muscle wasting or atrophy.   Neurologic: Cranial nerves 2 to 12 intact. Power, tone ,sensation and reflexes normal throughout. No disturbance in gait. No tremor.  Skin: Intact, no ulceration, erythema , scaling or rash noted. Pigmentation normal throughout Bilateral severe onychomycosis  Psych; Normal mood and affect. Judgement and concentration normal   Assessment & Plan:  Annual physical exam Annual exam as  documented. Counseling done  re healthy lifestyle involving commitment to 150 minutes exercise per week, heart healthy diet, and attaining healthy weight.The importance of adequate sleep also discussed. Regular seat belt use and home safety, is also discussed. Changes in health habits are decided on by the patient with goals and time frames  set for achieving them. Immunization and cancer screening needs are specifically addressed at this visit.   Overweight (BMI 25.0-29.9) Improved, pt applauded on this. Patient re-educated about  the importance of commitment to a  minimum of 150 minutes of exercise per week.  The importance of healthy food choices with portion control discussed. Encouraged to start a food diary, count calories and to consider  joining a support group. Sample diet sheets offered. Goals set by the patient for the next several months.   Weight /BMI 07/25/2017 03/01/2017 08/04/2016  WEIGHT 199 lb 1.9 oz 206 lb 201 lb 8 oz  HEIGHT 5\' 10"  5\' 10"  5\' 10"   BMI 28.57 kg/m2 29.56 kg/m2 28.91 kg/m2      Type 2 diabetes, diet controlled ([place]) Updated lab needed at/ before next visit. Mr. Ringwald is reminded of the importance of commitment to daily physical activity for 30 minutes or more, as able and the need to limit carbohydrate intake to 30 to 60 grams per meal to help with blood sugar control.   .   Mr. Damron is reminded of the importance of daily foot exam, annual eye examination, and good blood sugar, blood pressure and cholesterol control.  Diabetic Labs Latest Ref Rng & Units 03/01/2017 07/29/2016 07/21/2016 09/03/2015 09/03/2015  HbA1c <5.7 % of total Hgb 6.2(H) 6.0(H) - - 6.1(H)  Microalbumin Not Estab. ug/mL - - - 256.0(H) -  Micro/Creat Ratio 0.0 - 30.0 mg/g creat - - - 191.8(H) -  Chol <200 mg/dL 140 86 - - 95(L)  HDL >40 mg/dL 28(L) 41 - - 40  Calc LDL mg/dL (calc) 78 36 - - 41  Triglycerides <150 mg/dL 256(H) 45 - - 72  Creatinine 0.70 - 1.33 mg/dL 0.91 0.94 0.89 -  0.94   BP/Weight 07/25/2017 03/01/2017 08/04/2016 07/26/2016 07/21/2016 06/08/2016 8/92/1194  Systolic BP 174 081 448 185 631 497 026  Diastolic BP 64 70 84 83 74 73 78  Wt. (Lbs) 199.12 206 201.5 199 199 191 203.12  BMI 28.57 29.56 28.91 28.55 28.55 27.41 29.14   Foot/eye exam completion dates Latest Ref Rng & Units 07/25/2017 04/28/2016  Eye Exam No Retinopathy - -  Foot Form Completion - Done Done        Insomnia Inadequately controlled on current medication dose Sleep hygiene reviewed and written information offered also. Prescription sent for  medication needed.at a higher dose

## 2017-07-26 ENCOUNTER — Encounter: Payer: Self-pay | Admitting: Family Medicine

## 2017-07-26 ENCOUNTER — Other Ambulatory Visit (HOSPITAL_COMMUNITY)
Admission: RE | Admit: 2017-07-26 | Discharge: 2017-07-26 | Disposition: A | Discharge status: Home / Self Care | Payer: BLUE CROSS/BLUE SHIELD | Source: Other Acute Inpatient Hospital | Attending: Family Medicine | Admitting: Family Medicine

## 2017-07-26 DIAGNOSIS — G4700 Insomnia, unspecified: Secondary | ICD-10-CM | POA: Insufficient documentation

## 2017-07-26 DIAGNOSIS — E119 Type 2 diabetes mellitus without complications: Secondary | ICD-10-CM | POA: Insufficient documentation

## 2017-07-26 NOTE — Assessment & Plan Note (Signed)
Inadequately controlled on current medication dose Sleep hygiene reviewed and written information offered also. Prescription sent for  medication needed.at a higher dose

## 2017-07-26 NOTE — Assessment & Plan Note (Signed)
Improved, pt applauded on this. Patient re-educated about  the importance of commitment to a  minimum of 150 minutes of exercise per week.  The importance of healthy food choices with portion control discussed. Encouraged to start a food diary, count calories and to consider  joining a support group. Sample diet sheets offered. Goals set by the patient for the next several months.   Weight /BMI 07/25/2017 03/01/2017 08/04/2016  WEIGHT 199 lb 1.9 oz 206 lb 201 lb 8 oz  HEIGHT 5\' 10"  5\' 10"  5\' 10"   BMI 28.57 kg/m2 29.56 kg/m2 28.91 kg/m2

## 2017-07-26 NOTE — Assessment & Plan Note (Signed)
Updated lab needed at/ before next visit. Bryan Rodgers is reminded of the importance of commitment to daily physical activity for 30 minutes or more, as able and the need to limit carbohydrate intake to 30 to 60 grams per meal to help with blood sugar control.   .   Bryan Rodgers is reminded of the importance of daily foot exam, annual eye examination, and good blood sugar, blood pressure and cholesterol control.  Diabetic Labs Latest Ref Rng & Units 03/01/2017 07/29/2016 07/21/2016 09/03/2015 09/03/2015  HbA1c <5.7 % of total Hgb 6.2(H) 6.0(H) - - 6.1(H)  Microalbumin Not Estab. ug/mL - - - 256.0(H) -  Micro/Creat Ratio 0.0 - 30.0 mg/g creat - - - 191.8(H) -  Chol <200 mg/dL 140 86 - - 95(L)  HDL >40 mg/dL 28(L) 41 - - 40  Calc LDL mg/dL (calc) 78 36 - - 41  Triglycerides <150 mg/dL 256(H) 45 - - 72  Creatinine 0.70 - 1.33 mg/dL 0.91 0.94 0.89 - 0.94   BP/Weight 07/25/2017 03/01/2017 08/04/2016 07/26/2016 07/21/2016 06/08/2016 9/62/8366  Systolic BP 294 765 465 035 465 681 275  Diastolic BP 64 70 84 83 74 73 78  Wt. (Lbs) 199.12 206 201.5 199 199 191 203.12  BMI 28.57 29.56 28.91 28.55 28.55 27.41 29.14   Foot/eye exam completion dates Latest Ref Rng & Units 07/25/2017 04/28/2016  Eye Exam No Retinopathy - -  Foot Form Completion - Done Done

## 2017-07-26 NOTE — Assessment & Plan Note (Signed)

## 2017-07-27 LAB — MICROALBUMIN, URINE: Microalb, Ur: 34.4 ug/mL — ABNORMAL HIGH

## 2017-07-28 ENCOUNTER — Other Ambulatory Visit: Payer: Self-pay | Admitting: Family Medicine

## 2017-08-17 DIAGNOSIS — E119 Type 2 diabetes mellitus without complications: Secondary | ICD-10-CM | POA: Diagnosis not present

## 2017-08-17 DIAGNOSIS — E1169 Type 2 diabetes mellitus with other specified complication: Secondary | ICD-10-CM | POA: Diagnosis not present

## 2017-08-17 DIAGNOSIS — E785 Hyperlipidemia, unspecified: Secondary | ICD-10-CM | POA: Diagnosis not present

## 2017-08-17 DIAGNOSIS — I10 Essential (primary) hypertension: Secondary | ICD-10-CM | POA: Diagnosis not present

## 2017-08-18 LAB — COMPLETE METABOLIC PANEL WITH GFR
AG Ratio: 1.8 (calc) (ref 1.0–2.5)
ALT: 20 U/L (ref 9–46)
AST: 19 U/L (ref 10–35)
Albumin: 4.6 g/dL (ref 3.6–5.1)
Alkaline phosphatase (APISO): 130 U/L — ABNORMAL HIGH (ref 40–115)
BUN: 16 mg/dL (ref 7–25)
CALCIUM: 9.3 mg/dL (ref 8.6–10.3)
CO2: 23 mmol/L (ref 20–32)
CREATININE: 1.04 mg/dL (ref 0.70–1.33)
Chloride: 109 mmol/L (ref 98–110)
GFR, Est African American: 91 mL/min/{1.73_m2} (ref >=60)
GFR, Est Non African American: 79 mL/min/{1.73_m2} (ref >=60)
GLOBULIN: 2.5 g/dL (ref 1.9–3.7)
GLUCOSE: 89 mg/dL (ref 65–99)
Potassium: 4.1 mmol/L (ref 3.5–5.3)
SODIUM: 138 mmol/L (ref 135–146)
Total Bilirubin: 0.7 mg/dL (ref 0.2–1.2)
Total Protein: 7.1 g/dL (ref 6.1–8.1)

## 2017-08-18 LAB — CBC
HCT: 43.8 % (ref 38.5–50.0)
Hemoglobin: 14.1 g/dL (ref 13.2–17.1)
MCH: 29.1 pg (ref 27.0–33.0)
MCHC: 32.2 g/dL (ref 32.0–36.0)
MCV: 90.3 fL (ref 80.0–100.0)
MPV: 10.1 fL (ref 7.5–12.5)
Platelets: 206 10*3/uL (ref 140–400)
RBC: 4.85 10*6/uL (ref 4.20–5.80)
RDW: 13.5 % (ref 11.0–15.0)
WBC: 7.5 10*3/uL (ref 3.8–10.8)

## 2017-08-18 LAB — LIPID PANEL
CHOL/HDL RATIO: 2.9 (calc) (ref <5.0)
CHOLESTEROL: 93 mg/dL (ref <200)
HDL: 32 mg/dL — AB (ref >40)
LDL Cholesterol (Calc): 40 mg/dL (calc)
Non-HDL Cholesterol (Calc): 61 mg/dL (calc) (ref <130)
TRIGLYCERIDES: 120 mg/dL (ref <150)

## 2017-08-18 LAB — HEMOGLOBIN A1C
EAG (MMOL/L): 7.4 (calc)
HEMOGLOBIN A1C: 6.3 %{Hb} — AB (ref <5.7)
MEAN PLASMA GLUCOSE: 134 (calc)

## 2017-08-21 ENCOUNTER — Other Ambulatory Visit: Payer: Self-pay | Admitting: Family Medicine

## 2017-08-21 ENCOUNTER — Encounter: Payer: Self-pay | Admitting: Family Medicine

## 2017-08-21 MED ORDER — SIMVASTATIN 10 MG PO TABS: 10.0000 mg | ORAL_TABLET | Freq: Every day | ORAL | 3 refills | Status: DC

## 2017-08-21 NOTE — Progress Notes (Signed)
simvasta

## 2017-09-11 ENCOUNTER — Other Ambulatory Visit: Payer: Self-pay

## 2017-09-11 MED ORDER — TRAMADOL HCL 50 MG PO TABS: 50.0000 mg | ORAL_TABLET | Freq: Three times a day (TID) | ORAL | 3 refills | Status: DC | PRN

## 2017-10-06 ENCOUNTER — Other Ambulatory Visit: Payer: Self-pay | Admitting: Family Medicine

## 2017-10-09 ENCOUNTER — Other Ambulatory Visit: Payer: Self-pay | Admitting: Family Medicine

## 2017-11-02 ENCOUNTER — Other Ambulatory Visit: Payer: Self-pay | Admitting: Family Medicine

## 2017-12-06 ENCOUNTER — Other Ambulatory Visit: Payer: Self-pay | Admitting: Family Medicine

## 2017-12-23 ENCOUNTER — Other Ambulatory Visit: Payer: Self-pay | Admitting: Family Medicine

## 2018-01-17 ENCOUNTER — Other Ambulatory Visit: Payer: Self-pay | Admitting: Family Medicine

## 2018-01-18 ENCOUNTER — Ambulatory Visit (HOSPITAL_COMMUNITY)
Admission: RE | Admit: 2018-01-18 | Discharge: 2018-01-18 | Disposition: A | Discharge status: Home / Self Care | Payer: BLUE CROSS/BLUE SHIELD | Source: Ambulatory Visit | Attending: Family Medicine | Admitting: Family Medicine

## 2018-01-18 ENCOUNTER — Ambulatory Visit: Payer: BLUE CROSS/BLUE SHIELD | Admitting: Family Medicine

## 2018-01-18 ENCOUNTER — Encounter: Payer: Self-pay | Admitting: Family Medicine

## 2018-01-18 VITALS — BP 140/82 | HR 63 | Temp 98.4°F | Resp 15 | Ht 70.0 in | Wt 213.0 lb

## 2018-01-18 DIAGNOSIS — E669 Obesity, unspecified: Secondary | ICD-10-CM

## 2018-01-18 DIAGNOSIS — E119 Type 2 diabetes mellitus without complications: Secondary | ICD-10-CM | POA: Diagnosis not present

## 2018-01-18 DIAGNOSIS — J40 Bronchitis, not specified as acute or chronic: Secondary | ICD-10-CM | POA: Insufficient documentation

## 2018-01-18 DIAGNOSIS — R05 Cough: Secondary | ICD-10-CM | POA: Diagnosis not present

## 2018-01-18 DIAGNOSIS — I10 Essential (primary) hypertension: Secondary | ICD-10-CM | POA: Diagnosis not present

## 2018-01-18 DIAGNOSIS — G43109 Migraine with aura, not intractable, without status migrainosus: Secondary | ICD-10-CM | POA: Diagnosis not present

## 2018-01-18 DIAGNOSIS — E1140 Type 2 diabetes mellitus with diabetic neuropathy, unspecified: Secondary | ICD-10-CM

## 2018-01-18 DIAGNOSIS — E66811 Obesity, class 1: Secondary | ICD-10-CM

## 2018-01-18 MED ORDER — AZELASTINE HCL 0.1 % NA SOLN: 2.0000 | Freq: Two times a day (BID) | NASAL | 12 refills | Status: DC

## 2018-01-18 MED ORDER — TRAMADOL HCL 50 MG PO TABS: 50.0000 mg | ORAL_TABLET | Freq: Three times a day (TID) | ORAL | 3 refills | Status: DC | PRN

## 2018-01-18 MED ORDER — RIZATRIPTAN BENZOATE 10 MG PO TBDP: ORAL_TABLET | ORAL | 4 refills | Status: DC

## 2018-01-18 MED ORDER — LORATADINE 10 MG PO TABS: 10.0000 mg | ORAL_TABLET | Freq: Every day | ORAL | 3 refills | Status: DC

## 2018-01-18 MED ORDER — BUDESONIDE-FORMOTEROL FUMARATE 160-4.5 MCG/ACT IN AERO: 2.0000 | INHALATION_SPRAY | Freq: Two times a day (BID) | RESPIRATORY_TRACT | 3 refills | Status: DC

## 2018-01-18 MED ORDER — TOPIRAMATE 100 MG PO TABS: 100.0000 mg | ORAL_TABLET | Freq: Two times a day (BID) | ORAL | 5 refills | Status: DC

## 2018-01-18 MED ORDER — METOPROLOL SUCCINATE ER 25 MG PO TB24: 25.0000 mg | ORAL_TABLET | Freq: Every day | ORAL | 1 refills | Status: DC

## 2018-01-18 MED ORDER — ALBUTEROL SULFATE HFA 108 (90 BASE) MCG/ACT IN AERS: 2.0000 | INHALATION_SPRAY | Freq: Four times a day (QID) | RESPIRATORY_TRACT | 5 refills | Status: DC | PRN

## 2018-01-18 MED ORDER — OMEPRAZOLE 40 MG PO CPDR: DELAYED_RELEASE_CAPSULE | ORAL | 1 refills | Status: DC

## 2018-01-18 MED ORDER — SIMVASTATIN 10 MG PO TABS: 10.0000 mg | ORAL_TABLET | Freq: Every day | ORAL | 1 refills | Status: DC

## 2018-01-18 MED ORDER — PENICILLIN V POTASSIUM 500 MG PO TABS: 500.0000 mg | ORAL_TABLET | Freq: Three times a day (TID) | ORAL | 0 refills | Status: DC

## 2018-01-18 MED ORDER — TEMAZEPAM 30 MG PO CAPS: 30.0000 mg | ORAL_CAPSULE | Freq: Every evening | ORAL | 5 refills | Status: DC | PRN

## 2018-01-18 MED ORDER — BENZONATATE 100 MG PO CAPS: 100.0000 mg | ORAL_CAPSULE | Freq: Two times a day (BID) | ORAL | 0 refills | Status: DC | PRN

## 2018-01-18 NOTE — Progress Notes (Signed)
Bryan HEDGEPATH     MRN: [id number]      DOB: 1960-04-26   HPI Mr. Fertig is here for follow up and re-evaluation of chronic medical conditions, medication management and review of any available recent lab and radiology data.  Preventive health is updated, specifically  Cancer screening and Immunization.   . The PT denies any adverse reactions to current medications since the last visit.  4 week h/o excess chest congestion and, with yellow spputum, had chills initially, cough persists, sinuses are congested and nasal drainage is yellow ROS Staes headaches are uncontrolled has about 15 / mponth, reportedly, but on sub optimal dose of topamax HPI: Denies chest pains, palpitations and leg swelling Denies abdominal pain, nausea, vomiting,diarrhea or constipation.   Denies dysuria, frequency, hesitancy or incontinence. Denies joint pain, swelling and limitation in mobility. Denies  seizures, numbness, or tingling. Denies depression, anxiety or insomnia. Denies skin break down or rash.   PE  BP 140/82   Pulse 63   Temp 98.4 F (36.9 C) (Oral)   Resp 15   Ht 5\' 10"  (1.778 m)   Wt 213 lb (96.6 kg)   SpO2 98%   BMI 30.56 kg/m   Patient alert and oriented and in no cardiopulmonary distress.  HEENT: No facial asymmetry, EOMI,   oropharynx pink and moist.  Neck supple no JVD, no mass.  Chest: Adequate air entry , few scattered crackles, no wheezes  CVS: S1, S2 no murmurs, no S3.Regular rate.  ABD: Soft non tender.   Ext: No edema  MS: Adequate ROM spine, shoulders, hips and knees.  Skin: Intact, no ulcerations or rash noted.  Psych: Good eye contact, normal affect. Memory intact not anxious or depressed appearing.  CNS: CN 2-12 intact, power,  normal throughout.no focal deficits noted.   Assessment & Plan  Bronchitis Antibiotic and decongestant and CXR prescribed  Migraine headache Uncontrolled, reports high frequency Topamax dose increased , and he is to start a  diary  Type 2 diabetes, controlled, with neuropathy [facility]) Mr. Zanetti is reminded of the importance of commitment to daily physical activity for 30 minutes or more, as able and the need to limit carbohydrate intake to 30 to 60 grams per meal to help with blood sugar control.     Mr. Ballin is reminded of the importance of daily foot exam, annual eye examination, and good blood sugar, blood pressure and cholesterol control.  Diabetic Labs Latest Ref Rng & Units 08/17/2017 07/26/2017 03/01/2017 07/29/2016 07/21/2016  HbA1c <5.7 % of total Hgb 6.3(H) - 6.2(H) 6.0(H) -  Microalbumin Not Estab. ug/mL - 34.4(H) - - -  Micro/Creat Ratio 0.0 - 30.0 mg/g creat - - - - -  Chol <200 mg/dL 93 - 140 86 -  HDL >40 mg/dL 32(L) - 28(L) 41 -  Calc LDL mg/dL (calc) 40 - 78 36 -  Triglycerides <150 mg/dL 120 - 256(H) 45 -  Creatinine 0.70 - 1.33 mg/dL 1.04 - 0.91 0.94 0.89   BP/Weight 01/18/2018 07/25/2017 03/01/2017 08/04/2016 07/26/2016 7/90/2409 08/03/5327  Systolic BP 924 268 341 962 229 798 921  Diastolic BP 82 64 70 84 83 74 73  Wt. (Lbs) 213 199.12 206 201.5 199 199 191  BMI 30.56 28.57 29.56 28.91 28.55 28.55 27.41   Foot/eye exam completion dates Latest Ref Rng & Units 07/25/2017 04/28/2016  Eye Exam No Retinopathy - -  Foot Form Completion - Done Done        HTN (hypertension) Controlled, no  change in medication DASH diet and commitment to daily physical activity for a minimum of 30 minutes discussed and encouraged, as a part of hypertension management. The importance of attaining a healthy weight is also discussed.  BP/Weight 01/18/2018 07/25/2017 03/01/2017 08/04/2016 07/26/2016 1/54/0086 08/05/1948  Systolic BP 932 671 245 809 983 382 505  Diastolic BP 82 64 70 84 83 74 73  Wt. (Lbs) 213 199.12 206 201.5 199 199 191  BMI 30.56 28.57 29.56 28.91 28.55 28.55 27.41       Obesity (BMI 30.0-34.9) Deteriorated. Patient re-educated about  the importance of commitment to a  minimum of 150 minutes of  exercise per week.  The importance of healthy food choices with portion control discussed. Encouraged to start a food diary, count calories and to consider  joining a support group. Sample diet sheets offered. Goals set by the patient for the next several months.   Weight /BMI 01/18/2018 07/25/2017 03/01/2017  WEIGHT 213 lb 199 lb 1.9 oz 206 lb  HEIGHT 5\' 10"  5\' 10"  5\' 10"   BMI 30.56 kg/m2 28.57 kg/m2 29.56 kg/m2

## 2018-01-18 NOTE — Patient Instructions (Addendum)
F/U in 10 to 12 weeks, call if you need me sooner  Return early Jan for flu vaccine  CXR today  Medications as discussed at your pharmacy  Do headache diary, migraine frequency should be less   HBA1C, chem 7 and GFR  As soon as possible  Increase exercise    Thank you  for choosing [facility]. We consider it a privelige to serve you.  Delivering excellent health care in a caring and  compassionate way is our goal.  Partnering with you,  so that together we can achieve this goal is our strategy.

## 2018-01-18 NOTE — Assessment & Plan Note (Signed)
Antibiotic and decongestant and CXR prescribed

## 2018-01-19 ENCOUNTER — Encounter: Payer: Self-pay | Admitting: Family Medicine

## 2018-01-19 NOTE — Assessment & Plan Note (Signed)
Controlled, no change in medication DASH diet and commitment to daily physical activity for a minimum of 30 minutes discussed and encouraged, as a part of hypertension management. The importance of attaining a healthy weight is also discussed.  BP/Weight 01/18/2018 07/25/2017 03/01/2017 08/04/2016 07/26/2016 5/67/0141 0/03/129  Systolic BP 438 887 579 728 206 015 615  Diastolic BP 82 64 70 84 83 74 73  Wt. (Lbs) 213 199.12 206 201.5 199 199 191  BMI 30.56 28.57 29.56 28.91 28.55 28.55 27.41

## 2018-01-19 NOTE — Assessment & Plan Note (Signed)
Mr. Eisenberg is reminded of the importance of commitment to daily physical activity for 30 minutes or more, as able and the need to limit carbohydrate intake to 30 to 60 grams per meal to help with blood sugar control.     Mr. Mastrangelo is reminded of the importance of daily foot exam, annual eye examination, and good blood sugar, blood pressure and cholesterol control.  Diabetic Labs Latest Ref Rng & Units 08/17/2017 07/26/2017 03/01/2017 07/29/2016 07/21/2016  HbA1c <5.7 % of total Hgb 6.3(H) - 6.2(H) 6.0(H) -  Microalbumin Not Estab. ug/mL - 34.4(H) - - -  Micro/Creat Ratio 0.0 - 30.0 mg/g creat - - - - -  Chol <200 mg/dL 93 - 140 86 -  HDL >40 mg/dL 32(L) - 28(L) 41 -  Calc LDL mg/dL (calc) 40 - 78 36 -  Triglycerides <150 mg/dL 120 - 256(H) 45 -  Creatinine 0.70 - 1.33 mg/dL 1.04 - 0.91 0.94 0.89   BP/Weight 01/18/2018 07/25/2017 03/01/2017 08/04/2016 07/26/2016 07/26/6387 04/07/3426  Systolic BP 768 115 726 203 559 741 638  Diastolic BP 82 64 70 84 83 74 73  Wt. (Lbs) 213 199.12 206 201.5 199 199 191  BMI 30.56 28.57 29.56 28.91 28.55 28.55 27.41   Foot/eye exam completion dates Latest Ref Rng & Units 07/25/2017 04/28/2016  Eye Exam No Retinopathy - -  Foot Form Completion - Done Done

## 2018-01-19 NOTE — Assessment & Plan Note (Signed)
Deteriorated. Patient re-educated about  the importance of commitment to a  minimum of 150 minutes of exercise per week.  The importance of healthy food choices with portion control discussed. Encouraged to start a food diary, count calories and to consider  joining a support group. Sample diet sheets offered. Goals set by the patient for the next several months.   Weight /BMI 01/18/2018 07/25/2017 03/01/2017  WEIGHT 213 lb 199 lb 1.9 oz 206 lb  HEIGHT 5\' 10"  5\' 10"  5\' 10"   BMI 30.56 kg/m2 28.57 kg/m2 29.56 kg/m2

## 2018-01-19 NOTE — Assessment & Plan Note (Signed)
Uncontrolled, reports high frequency Topamax dose increased , and he is to start a diary

## 2018-02-01 ENCOUNTER — Other Ambulatory Visit: Payer: Self-pay | Admitting: Family Medicine

## 2018-02-01 MED ORDER — TRIAMCINOLONE ACETONIDE 0.1 % EX CREA: 1.0000 "application " | TOPICAL_CREAM | Freq: Two times a day (BID) | CUTANEOUS | 0 refills | Status: DC

## 2018-03-19 ENCOUNTER — Other Ambulatory Visit: Payer: Self-pay | Admitting: Family Medicine

## 2018-03-27 ENCOUNTER — Other Ambulatory Visit: Payer: Self-pay | Admitting: Family Medicine

## 2018-03-27 MED ORDER — TRIAMCINOLONE ACETONIDE 0.025 % EX OINT: TOPICAL_OINTMENT | CUTANEOUS | 1 refills | Status: DC

## 2018-03-29 ENCOUNTER — Ambulatory Visit: Payer: BLUE CROSS/BLUE SHIELD | Admitting: Family Medicine

## 2018-04-17 ENCOUNTER — Ambulatory Visit: Payer: BLUE CROSS/BLUE SHIELD | Admitting: Family Medicine

## 2018-05-16 ENCOUNTER — Other Ambulatory Visit: Payer: Self-pay | Admitting: Family Medicine

## 2018-05-28 ENCOUNTER — Other Ambulatory Visit: Payer: Self-pay

## 2018-05-28 ENCOUNTER — Other Ambulatory Visit: Payer: Self-pay | Admitting: Family Medicine

## 2018-05-28 MED ORDER — RIZATRIPTAN BENZOATE 10 MG PO TBDP: ORAL_TABLET | ORAL | 3 refills | Status: DC

## 2018-05-29 ENCOUNTER — Other Ambulatory Visit: Payer: Self-pay

## 2018-05-29 ENCOUNTER — Encounter: Payer: Self-pay | Admitting: Family Medicine

## 2018-05-29 ENCOUNTER — Ambulatory Visit (INDEPENDENT_AMBULATORY_CARE_PROVIDER_SITE_OTHER): Payer: BLUE CROSS/BLUE SHIELD | Admitting: Family Medicine

## 2018-05-29 VITALS — BP 126/70 | HR 66 | Ht 70.0 in | Wt 207.0 lb

## 2018-05-29 DIAGNOSIS — F5101 Primary insomnia: Secondary | ICD-10-CM

## 2018-05-29 DIAGNOSIS — I10 Essential (primary) hypertension: Secondary | ICD-10-CM | POA: Diagnosis not present

## 2018-05-29 DIAGNOSIS — E559 Vitamin D deficiency, unspecified: Secondary | ICD-10-CM

## 2018-05-29 DIAGNOSIS — M549 Dorsalgia, unspecified: Secondary | ICD-10-CM

## 2018-05-29 DIAGNOSIS — E1169 Type 2 diabetes mellitus with other specified complication: Secondary | ICD-10-CM

## 2018-05-29 DIAGNOSIS — E119 Type 2 diabetes mellitus without complications: Secondary | ICD-10-CM

## 2018-05-29 DIAGNOSIS — E663 Overweight: Secondary | ICD-10-CM

## 2018-05-29 DIAGNOSIS — E785 Hyperlipidemia, unspecified: Secondary | ICD-10-CM

## 2018-05-29 DIAGNOSIS — G43109 Migraine with aura, not intractable, without status migrainosus: Secondary | ICD-10-CM

## 2018-05-29 MED ORDER — TRAMADOL HCL 50 MG PO TABS: ORAL_TABLET | ORAL | 0 refills | Status: AC

## 2018-05-29 MED ORDER — LORATADINE 10 MG PO TABS: 10.0000 mg | ORAL_TABLET | Freq: Every day | ORAL | 3 refills | Status: DC

## 2018-05-29 MED ORDER — TEMAZEPAM 30 MG PO CAPS: ORAL_CAPSULE | ORAL | 5 refills | Status: DC

## 2018-05-29 NOTE — Patient Instructions (Addendum)
Physical exam mid July, call if you need me before, hope to give you the shingles vaccine at that visit  Fasting lipid, cmp and eGFR and hBA1C , cBC, TSH and vit D I week before July appointment  Congrats on reported weight loss, continue healthy lifestyle  Call if left shoulder pain worsens  Dose increase to two times daily tramadol for your chronic pain, starting with your next fill in May  Social distancing. Frequent hand washing with soap and water Keeping your hands off of your face. These 3 practices will help to keep both you and your community healthy during this time. Please practice them faithfully!   Thanks for choosing [facility], we consider it a privelige to serve you.  

## 2018-05-29 NOTE — Progress Notes (Signed)
Virtual Visit via Telephone Note  I connected with Bryan Rodgers on 05/29/18 at  2:00 PM EDT by telephone and verified that I am speaking with the correct person using two identifiers.   I discussed the limitations, risks, security and privacy concerns of performing an evaluation and management service by telephone and the availability of in person appointments. I also discussed with the patient that there may be a patient responsible charge related to this service. The patient expressed understanding and agreed to proceed. Pt is at his home and I am in the office   History of Present Illness: Denies recent fever or chills. Denies sinus pressure, nasal congestion, ear pain or sore throat. Denies chest congestion, productive cough or wheezing. Denies chest pains, palpitations and leg swelling Denies abdominal pain, nausea, vomiting,diarrhea or constipation.   Denies dysuria, frequency, hesitancy or incontinence. C/o chronic neck pain and spasm, no change. Denies   Uncontrolled headaches, seizures, numbness, or tingling. Denies depression, anxiety and reports adequately treated  insomnia. Denies skin break down or rash.       Observations/Objective: BP 126/70   Pulse 66   Ht 5\' 10"  (1.778 m)   Wt 207 lb (93.9 kg)   SpO2 98%   BMI 29.70 kg/m  Good communication with no confusion and intact memory. Alert and oriented x 3 No signs of respiratory distress during sppech    Assessment and Plan: HTN (hypertension) ,con DASH diet and commitment to daily physical activity for a minimum of 30 minutes discussed and encouraged, as a part of hypertension management. The importance of attaining a healthy weight is also discussed.  BP/Weight 05/29/2018 01/18/2018 07/25/2017 03/01/2017 08/04/2016 07/26/2016 5/70/1779  Systolic BP 390 300 923 300 762 263 335  Diastolic BP 70 82 64 70 84 83 74  Wt. (Lbs) 207 213 199.12 206 201.5 199 199  BMI 29.7 30.56 28.57 29.56 28.91 28.55 28.55        Insomnia GOOD RESPONSE TO MEDICATION, CONTINUE SAME Sleep hygiene reviewed and written information offered also. Prescription sent for  medication needed.   Migraine headache Controlled, no change in medication   Type 2 diabetes, diet controlled [facility]) Bryan Rodgers is reminded of the importance of commitment to daily physical activity for 30 minutes or more, as able and the need to limit carbohydrate intake to 30 to 60 grams per meal to help with blood sugar control.   Bryan Rodgers is reminded of the importance of daily foot exam, annual eye examination, and good blood sugar, blood pressure and cholesterol control.  Diabetic Labs Latest Ref Rng & Units 08/17/2017 07/26/2017 03/01/2017 07/29/2016 07/21/2016  HbA1c <5.7 % of total Hgb 6.3(H) - 6.2(H) 6.0(H) -  Microalbumin Not Estab. ug/mL - 34.4(H) - - -  Micro/Creat Ratio 0.0 - 30.0 mg/g creat - - - - -  Chol <200 mg/dL 93 - 140 86 -  HDL >40 mg/dL 32(L) - 28(L) 41 -  Calc LDL mg/dL (calc) 40 - 78 36 -  Triglycerides <150 mg/dL 120 - 256(H) 45 -  Creatinine 0.70 - 1.33 mg/dL 1.04 - 0.91 0.94 0.89   BP/Weight 05/29/2018 01/18/2018 07/25/2017 03/01/2017 08/04/2016 07/26/2016 4/56/2563  Systolic BP 893 734 287 681 157 262 035  Diastolic BP 70 82 64 70 84 83 74  Wt. (Lbs) 207 213 199.12 206 201.5 199 199  BMI 29.7 30.56 28.57 29.56 28.91 28.55 28.55   Foot/eye exam completion dates Latest Ref Rng & Units 07/25/2017 04/28/2016  Eye Exam No Retinopathy - -  Foot Form Completion - Done Done      Updated lab needed at/ before next visit.   Hyperlipidemia associated with type 2 diabetes mellitus ([place]) Hyperlipidemia:Low fat diet discussed and encouraged.   Lipid Panel  Lab Results  Component Value Date   CHOL 93 08/17/2017   HDL 32 (L) 08/17/2017   LDLCALC 40 08/17/2017   LDLDIRECT 48 07/08/2010   TRIG 120 08/17/2017   CHOLHDL 2.9 08/17/2017     Updated lab needed at/ before next visit.   Back pain with  radiation Controlled, no change in medication Narc registry reviewed and medication prescribed as before   Overweight (BMI 25.0-29.9) Improved and he is applauded on this Patient re-educated about  the importance of commitment to a  minimum of 150 minutes of exercise per week as able.  The importance of healthy food choices with portion control discussed, as well as eating regularly and within a 12 hour window most days. The need to choose "clean , green" food 50 to 75% of the time is discussed, as well as to make water the primary drink and set a goal of 64 ounces water daily.   Weight /BMI 05/29/2018 01/18/2018 07/25/2017  WEIGHT 207 lb 213 lb 199 lb 1.9 oz  HEIGHT 5\' 10"  5\' 10"  5\' 10"   BMI 29.7 kg/m2 30.56 kg/m2 28.57 kg/m2        Follow Up Instructions:    I discussed the assessment and treatment plan with the patient. The patient was provided an opportunity to ask questions and all were answered. The patient agreed with the plan and demonstrated an understanding of the instructions.   The patient was advised to call back or seek an in-person evaluation if the symptoms worsen or if the condition fails to improve as anticipated.  I provided 22 minutes of non-face-to-face time during this encounter.   Tula Nakayama, MD

## 2018-06-02 NOTE — Assessment & Plan Note (Addendum)
Bryan Rodgers is reminded of the importance of commitment to daily physical activity for 30 minutes or more, as able and the need to limit carbohydrate intake to 30 to 60 grams per meal to help with blood sugar control.   Bryan Rodgers is reminded of the importance of daily foot exam, annual eye examination, and good blood sugar, blood pressure and cholesterol control.  Diabetic Labs Latest Ref Rng & Units 08/17/2017 07/26/2017 03/01/2017 07/29/2016 07/21/2016  HbA1c <5.7 % of total Hgb 6.3(H) - 6.2(H) 6.0(H) -  Microalbumin Not Estab. ug/mL - 34.4(H) - - -  Micro/Creat Ratio 0.0 - 30.0 mg/g creat - - - - -  Chol <200 mg/dL 93 - 140 86 -  HDL >40 mg/dL 32(L) - 28(L) 41 -  Calc LDL mg/dL (calc) 40 - 78 36 -  Triglycerides <150 mg/dL 120 - 256(H) 45 -  Creatinine 0.70 - 1.33 mg/dL 1.04 - 0.91 0.94 0.89   BP/Weight 05/29/2018 01/18/2018 07/25/2017 03/01/2017 08/04/2016 07/26/2016 8/61/6837  Systolic BP 290 211 155 208 022 336 122  Diastolic BP 70 82 64 70 84 83 74  Wt. (Lbs) 207 213 199.12 206 201.5 199 199  BMI 29.7 30.56 28.57 29.56 28.91 28.55 28.55   Foot/eye exam completion dates Latest Ref Rng & Units 07/25/2017 04/28/2016  Eye Exam No Retinopathy - -  Foot Form Completion - Done Done      Updated lab needed at/ before next visit.

## 2018-06-02 NOTE — Assessment & Plan Note (Signed)
Controlled, no change in medication [place] registry reviewed and medication prescribed as before

## 2018-06-02 NOTE — Assessment & Plan Note (Signed)
,  con DASH diet and commitment to daily physical activity for a minimum of 30 minutes discussed and encouraged, as a part of hypertension management. The importance of attaining a healthy weight is also discussed.  BP/Weight 05/29/2018 01/18/2018 07/25/2017 03/01/2017 08/04/2016 07/26/2016 2/89/7915  Systolic BP 041 364 383 779 396 886 484  Diastolic BP 70 82 64 70 84 83 74  Wt. (Lbs) 207 213 199.12 206 201.5 199 199  BMI 29.7 30.56 28.57 29.56 28.91 28.55 28.55

## 2018-06-02 NOTE — Assessment & Plan Note (Signed)
GOOD RESPONSE TO MEDICATION, CONTINUE SAME Sleep hygiene reviewed and written information offered also. Prescription sent for  medication needed.

## 2018-06-02 NOTE — Assessment & Plan Note (Signed)
Controlled, no change in medication  

## 2018-06-02 NOTE — Assessment & Plan Note (Signed)
Hyperlipidemia:Low fat diet discussed and encouraged.   Lipid Panel  Lab Results  Component Value Date   CHOL 93 08/17/2017   HDL 32 (L) 08/17/2017   LDLCALC 40 08/17/2017   LDLDIRECT 48 07/08/2010   TRIG 120 08/17/2017   CHOLHDL 2.9 08/17/2017     Updated lab needed at/ before next visit.

## 2018-06-02 NOTE — Assessment & Plan Note (Signed)
Improved and he is applauded on this Patient re-educated about  the importance of commitment to a  minimum of 150 minutes of exercise per week as able.  The importance of healthy food choices with portion control discussed, as well as eating regularly and within a 12 hour window most days. The need to choose "clean , green" food 50 to 75% of the time is discussed, as well as to make water the primary drink and set a goal of 64 ounces water daily.   Weight /BMI 05/29/2018 01/18/2018 07/25/2017  WEIGHT 207 lb 213 lb 199 lb 1.9 oz  HEIGHT 5\' 10"  5\' 10"  5\' 10"   BMI 29.7 kg/m2 30.56 kg/m2 28.57 kg/m2

## 2018-07-20 ENCOUNTER — Other Ambulatory Visit: Payer: Self-pay | Admitting: Family Medicine

## 2018-07-27 ENCOUNTER — Other Ambulatory Visit: Payer: Self-pay | Admitting: Internal Medicine

## 2018-07-27 ENCOUNTER — Other Ambulatory Visit: Payer: BLUE CROSS/BLUE SHIELD

## 2018-07-27 DIAGNOSIS — Z20822 Contact with and (suspected) exposure to covid-19: Secondary | ICD-10-CM

## 2018-07-27 DIAGNOSIS — R6889 Other general symptoms and signs: Secondary | ICD-10-CM | POA: Diagnosis not present

## 2018-08-03 LAB — NOVEL CORONAVIRUS, NAA: SARS-CoV-2, NAA: NOT DETECTED

## 2018-08-07 DIAGNOSIS — E559 Vitamin D deficiency, unspecified: Secondary | ICD-10-CM | POA: Diagnosis not present

## 2018-08-07 DIAGNOSIS — E1169 Type 2 diabetes mellitus with other specified complication: Secondary | ICD-10-CM | POA: Diagnosis not present

## 2018-08-07 DIAGNOSIS — E119 Type 2 diabetes mellitus without complications: Secondary | ICD-10-CM | POA: Diagnosis not present

## 2018-08-07 DIAGNOSIS — I10 Essential (primary) hypertension: Secondary | ICD-10-CM | POA: Diagnosis not present

## 2018-08-07 DIAGNOSIS — E785 Hyperlipidemia, unspecified: Secondary | ICD-10-CM | POA: Diagnosis not present

## 2018-08-08 LAB — VITAMIN D 25 HYDROXY (VIT D DEFICIENCY, FRACTURES): Vit D, 25-Hydroxy: 28 ng/mL — ABNORMAL LOW (ref 30–100)

## 2018-08-08 LAB — LIPID PANEL
Cholesterol: 132 mg/dL (ref <200)
HDL: 36 mg/dL — ABNORMAL LOW (ref >=40)
LDL Cholesterol (Calc): 74 mg/dL (calc)
Non-HDL Cholesterol (Calc): 96 mg/dL (calc) (ref <130)
Total CHOL/HDL Ratio: 3.7 (calc) (ref <5.0)
Triglycerides: 141 mg/dL (ref <150)

## 2018-08-08 LAB — COMPLETE METABOLIC PANEL WITH GFR
AG Ratio: 1.7 (calc) (ref 1.0–2.5)
ALT: 23 U/L (ref 9–46)
AST: 26 U/L (ref 10–35)
Albumin: 5.1 g/dL (ref 3.6–5.1)
Alkaline phosphatase (APISO): 124 U/L (ref 35–144)
BUN: 18 mg/dL (ref 7–25)
CO2: 23 mmol/L (ref 20–32)
Calcium: 10.2 mg/dL (ref 8.6–10.3)
Chloride: 108 mmol/L (ref 98–110)
Creat: 1.07 mg/dL (ref 0.70–1.33)
GFR, Est African American: 88 mL/min/{1.73_m2} (ref >=60)
GFR, Est Non African American: 76 mL/min/{1.73_m2} (ref >=60)
Globulin: 3 g/dL (calc) (ref 1.9–3.7)
Glucose, Bld: 90 mg/dL (ref 65–99)
Potassium: 4.5 mmol/L (ref 3.5–5.3)
Sodium: 139 mmol/L (ref 135–146)
Total Bilirubin: 0.9 mg/dL (ref 0.2–1.2)
Total Protein: 8.1 g/dL (ref 6.1–8.1)

## 2018-08-08 LAB — HEMOGLOBIN A1C
Hgb A1c MFr Bld: 6.3 % of total Hgb — ABNORMAL HIGH (ref <5.7)
Mean Plasma Glucose: 134 (calc)
eAG (mmol/L): 7.4 (calc)

## 2018-08-08 LAB — CBC
HCT: 50.7 % — ABNORMAL HIGH (ref 38.5–50.0)
Hemoglobin: 16.7 g/dL (ref 13.2–17.1)
MCH: 29.6 pg (ref 27.0–33.0)
MCHC: 32.9 g/dL (ref 32.0–36.0)
MCV: 89.7 fL (ref 80.0–100.0)
MPV: 10.2 fL (ref 7.5–12.5)
Platelets: 196 10*3/uL (ref 140–400)
RBC: 5.65 10*6/uL (ref 4.20–5.80)
RDW: 13.4 % (ref 11.0–15.0)
WBC: 7.8 10*3/uL (ref 3.8–10.8)

## 2018-08-08 LAB — TSH: TSH: 1.01 mIU/L (ref 0.40–4.50)

## 2018-08-10 ENCOUNTER — Other Ambulatory Visit: Payer: Self-pay | Admitting: Family Medicine

## 2018-08-20 ENCOUNTER — Encounter: Payer: BLUE CROSS/BLUE SHIELD | Admitting: Family Medicine

## 2018-08-29 ENCOUNTER — Encounter: Payer: Self-pay | Admitting: Family Medicine

## 2018-09-21 DIAGNOSIS — H524 Presbyopia: Secondary | ICD-10-CM | POA: Diagnosis not present

## 2018-11-02 ENCOUNTER — Other Ambulatory Visit: Payer: Self-pay | Admitting: Family Medicine

## 2018-11-02 ENCOUNTER — Other Ambulatory Visit: Payer: Self-pay

## 2018-11-02 ENCOUNTER — Telehealth: Payer: Self-pay | Admitting: *Deleted

## 2018-11-02 MED ORDER — RIZATRIPTAN BENZOATE 10 MG PO TBDP: ORAL_TABLET | ORAL | 0 refills | Status: DC

## 2018-11-02 NOTE — Telephone Encounter (Signed)
Pt needs migraine meds sent to [facility] he is out of the medication

## 2018-11-05 NOTE — Telephone Encounter (Signed)
Medication sent in. 

## 2018-11-08 ENCOUNTER — Encounter: Payer: Self-pay | Admitting: Family Medicine

## 2018-11-09 ENCOUNTER — Other Ambulatory Visit: Payer: Self-pay | Admitting: Family Medicine

## 2018-11-19 ENCOUNTER — Other Ambulatory Visit: Payer: Self-pay | Admitting: Family Medicine

## 2018-11-24 ENCOUNTER — Other Ambulatory Visit: Payer: Self-pay | Admitting: Family Medicine

## 2018-12-03 ENCOUNTER — Other Ambulatory Visit: Payer: Self-pay | Admitting: Family Medicine

## 2018-12-04 ENCOUNTER — Other Ambulatory Visit: Payer: Self-pay | Admitting: Family Medicine

## 2018-12-11 ENCOUNTER — Other Ambulatory Visit: Payer: Self-pay

## 2018-12-11 ENCOUNTER — Encounter: Payer: BC Managed Care – PPO | Admitting: Family Medicine

## 2018-12-11 DIAGNOSIS — Z20822 Contact with and (suspected) exposure to covid-19: Secondary | ICD-10-CM

## 2018-12-12 LAB — NOVEL CORONAVIRUS, NAA: SARS-CoV-2, NAA: NOT DETECTED

## 2018-12-13 ENCOUNTER — Other Ambulatory Visit: Payer: Self-pay

## 2018-12-13 ENCOUNTER — Ambulatory Visit (INDEPENDENT_AMBULATORY_CARE_PROVIDER_SITE_OTHER): Payer: BC Managed Care – PPO | Admitting: Family Medicine

## 2018-12-13 ENCOUNTER — Encounter: Payer: Self-pay | Admitting: Family Medicine

## 2018-12-13 VITALS — BP 126/70 | HR 66 | Resp 15 | Ht 70.0 in | Wt 207.0 lb

## 2018-12-13 DIAGNOSIS — E1169 Type 2 diabetes mellitus with other specified complication: Secondary | ICD-10-CM | POA: Diagnosis not present

## 2018-12-13 DIAGNOSIS — I10 Essential (primary) hypertension: Secondary | ICD-10-CM

## 2018-12-13 DIAGNOSIS — J302 Other seasonal allergic rhinitis: Secondary | ICD-10-CM

## 2018-12-13 DIAGNOSIS — E785 Hyperlipidemia, unspecified: Secondary | ICD-10-CM

## 2018-12-13 DIAGNOSIS — M549 Dorsalgia, unspecified: Secondary | ICD-10-CM

## 2018-12-13 DIAGNOSIS — R7303 Prediabetes: Secondary | ICD-10-CM

## 2018-12-13 MED ORDER — PREDNISONE 5 MG PO TABS: 5.0000 mg | ORAL_TABLET | Freq: Two times a day (BID) | ORAL | 0 refills | Status: AC

## 2018-12-13 MED ORDER — BENZONATATE 100 MG PO CAPS: 100.0000 mg | ORAL_CAPSULE | Freq: Two times a day (BID) | ORAL | 0 refills | Status: DC | PRN

## 2018-12-13 MED ORDER — TRAMADOL HCL 50 MG PO TABS: 50.0000 mg | ORAL_TABLET | Freq: Two times a day (BID) | ORAL | 4 refills | Status: AC

## 2018-12-13 NOTE — Patient Instructions (Signed)
Keep appointment in December as scheduled, call if you need me sooner  Three medications are prescribed as discussed  No labs needed prior to next visit

## 2018-12-16 ENCOUNTER — Encounter: Payer: Self-pay | Admitting: Family Medicine

## 2018-12-16 DIAGNOSIS — J302 Other seasonal allergic rhinitis: Secondary | ICD-10-CM | POA: Insufficient documentation

## 2018-12-16 DIAGNOSIS — R7303 Prediabetes: Secondary | ICD-10-CM | POA: Insufficient documentation

## 2018-12-16 NOTE — Progress Notes (Signed)
Virtual Visit via Telephone Note  I connected with Tiney Rouge on 12/16/18 at  2:20 PM EST by telephone and verified that I am speaking with the correct person using two identifiers.  Location: Patient: car Provider: office   I discussed the limitations, risks, security and privacy concerns of performing an evaluation and management service by telephone and the availability of in person appointments. I also discussed with the patient that there may be a patient responsible charge related to this service. The patient expressed understanding and agreed to proceed.   History of Present Illness: 1 week h/o cough, excess sneezing and scratchy throat, denies any recent fever or chills, had negative covid test 2 weeks ago and again re tested  1 week ago, both negative   Observations/Objective: BP 126/70   Pulse 66   Resp 15   Ht 5\' 10"  (1.778 m)   Wt 207 lb (93.9 kg)   BMI 29.70 kg/m  Good communication with no confusion and intact memory. Alert and oriented x 3 No signs of respiratory distress during speech    Assessment and Plan:  Seasonal allergies Increased and uncontrolled, short course of prednisone  And decongestant prescribed  HTN (hypertension) Controlled, no change in medication DASH diet and commitment to daily physical activity for a minimum of 30 minutes discussed and encouraged, as a part of hypertension management. The importance of attaining a healthy weight is also discussed.  BP/Weight 12/13/2018 05/29/2018 01/18/2018 07/25/2017 03/01/2017 08/04/2016 123XX123  Systolic BP 123XX123 123XX123 [id number]  Diastolic BP 70 70 82 64 70 84 83  Wt. (Lbs) 207 207 213 199.12 206 201.5 199  BMI 29.7 29.7 30.56 28.57 29.56 28.91 28.55       Back pain with radiation Uncontrolled on current dose of tramadol, increased to twice daily  Hyperlipidemia associated with type 2 diabetes mellitus ([place]) Hyperlipidemia:Low fat diet discussed and encouraged.   Lipid Panel   Lab Results  Component Value Date   CHOL 132 08/07/2018   HDL 36 (L) 08/07/2018   LDLCALC 74 08/07/2018   LDLDIRECT 48 07/08/2010   TRIG 141 08/07/2018   CHOLHDL 3.7 08/07/2018   Needs to increase exercise, no medication change    Prediabetes Controlled by diet Mr. Rogalski is reminded of the importance of commitment to daily physical activity for 30 minutes or more, as able and the need to limit carbohydrate intake to 30 to 60 grams per meal to help with blood sugar control.    Mr. Gieske is reminded of the importance of daily foot exam, annual eye examination, and good blood sugar, blood pressure and cholesterol control.  Diabetic Labs Latest Ref Rng & Units 08/07/2018 08/17/2017 07/26/2017 03/01/2017 07/29/2016  HbA1c <5.7 % of total Hgb 6.3(H) 6.3(H) - 6.2(H) 6.0(H)  Microalbumin Not Estab. ug/mL - - 34.4(H) - -  Micro/Creat Ratio 0.0 - 30.0 mg/g creat - - - - -  Chol <200 mg/dL 132 93 - 140 86  HDL > OR = 40 mg/dL 36(L) 32(L) - 28(L) 41  Calc LDL mg/dL (calc) 74 40 - 78 36  Triglycerides <150 mg/dL 141 120 - 256(H) 45  Creatinine 0.70 - 1.33 mg/dL 1.07 1.04 - 0.91 0.94   BP/Weight 12/13/2018 05/29/2018 01/18/2018 07/25/2017 03/01/2017 08/04/2016 123XX123  Systolic BP 123XX123 123XX123 [id number]  Diastolic BP 70 70 82 64 70 84 83  Wt. (Lbs) 207 207 213 199.12 206 201.5 199  BMI 29.7 29.7 30.56 28.57 29.56 28.91  28.55   Foot/eye exam completion dates Latest Ref Rng & Units 07/25/2017 04/28/2016  Eye Exam No Retinopathy - -  Foot Form Completion - Done Done         Follow Up Instructions:    I discussed the assessment and treatment plan with the patient. The patient was provided an opportunity to ask questions and all were answered. The patient agreed with the plan and demonstrated an understanding of the instructions.   The patient was advised to call back or seek an in-person evaluation if the symptoms worsen or if the condition fails to improve as anticipated.  I  provided 21 minutes of non-face-to-face time during this encounter.   Tula Nakayama, MD

## 2018-12-16 NOTE — Assessment & Plan Note (Signed)
Uncontrolled on current dose of tramadol, increased to twice daily

## 2018-12-16 NOTE — Assessment & Plan Note (Signed)
Controlled by diet Bryan Rodgers is reminded of the importance of commitment to daily physical activity for 30 minutes or more, as able and the need to limit carbohydrate intake to 30 to 60 grams per meal to help with blood sugar control.    Bryan Rodgers is reminded of the importance of daily foot exam, annual eye examination, and good blood sugar, blood pressure and cholesterol control.  Diabetic Labs Latest Ref Rng & Units 08/07/2018 08/17/2017 07/26/2017 03/01/2017 07/29/2016  HbA1c <5.7 % of total Hgb 6.3(H) 6.3(H) - 6.2(H) 6.0(H)  Microalbumin Not Estab. ug/mL - - 34.4(H) - -  Micro/Creat Ratio 0.0 - 30.0 mg/g creat - - - - -  Chol <200 mg/dL 132 93 - 140 86  HDL > OR = 40 mg/dL 36(L) 32(L) - 28(L) 41  Calc LDL mg/dL (calc) 74 40 - 78 36  Triglycerides <150 mg/dL 141 120 - 256(H) 45  Creatinine 0.70 - 1.33 mg/dL 1.07 1.04 - 0.91 0.94   BP/Weight 12/13/2018 05/29/2018 01/18/2018 07/25/2017 03/01/2017 08/04/2016 123XX123  Systolic BP 123XX123 [id number] [id number]  Diastolic BP 70 70 82 64 70 84 83  Wt. (Lbs) 207 207 213 199.12 206 201.5 199  BMI 29.7 29.7 30.56 28.57 29.56 28.91 28.55   Foot/eye exam completion dates Latest Ref Rng & Units 07/25/2017 04/28/2016  Eye Exam No Retinopathy - -  Foot Form Completion - Done Done

## 2018-12-16 NOTE — Assessment & Plan Note (Signed)
Hyperlipidemia:Low fat diet discussed and encouraged.   Lipid Panel  Lab Results  Component Value Date   CHOL 132 08/07/2018   HDL 36 (L) 08/07/2018   LDLCALC 74 08/07/2018   LDLDIRECT 48 07/08/2010   TRIG 141 08/07/2018   CHOLHDL 3.7 08/07/2018   Needs to increase exercise, no medication change

## 2018-12-16 NOTE — Assessment & Plan Note (Signed)
Controlled, no change in medication DASH diet and commitment to daily physical activity for a minimum of 30 minutes discussed and encouraged, as a part of hypertension management. The importance of attaining a healthy weight is also discussed.  BP/Weight 12/13/2018 05/29/2018 01/18/2018 07/25/2017 03/01/2017 08/04/2016 123XX123  Systolic BP 123XX123 [id number]  Diastolic BP 70 70 82 64 70 84 83  Wt. (Lbs) 207 207 213 199.12 206 201.5 199  BMI 29.7 29.7 30.56 28.57 29.56 28.91 28.55

## 2018-12-16 NOTE — Assessment & Plan Note (Signed)
Increased and uncontrolled, short course of prednisone  And decongestant prescribed

## 2018-12-28 ENCOUNTER — Other Ambulatory Visit: Payer: Self-pay | Admitting: Family Medicine

## 2019-01-02 ENCOUNTER — Other Ambulatory Visit: Payer: Self-pay

## 2019-01-02 ENCOUNTER — Encounter: Payer: Self-pay | Admitting: Family Medicine

## 2019-01-02 ENCOUNTER — Ambulatory Visit: Payer: BC Managed Care – PPO | Admitting: Family Medicine

## 2019-01-02 VITALS — BP 139/82 | HR 60 | Temp 97.8°F | Resp 15 | Ht 70.0 in | Wt 195.0 lb

## 2019-01-02 DIAGNOSIS — R7303 Prediabetes: Secondary | ICD-10-CM | POA: Diagnosis not present

## 2019-01-02 DIAGNOSIS — I10 Essential (primary) hypertension: Secondary | ICD-10-CM | POA: Diagnosis not present

## 2019-01-02 DIAGNOSIS — E785 Hyperlipidemia, unspecified: Secondary | ICD-10-CM | POA: Diagnosis not present

## 2019-01-02 DIAGNOSIS — Z23 Encounter for immunization: Secondary | ICD-10-CM

## 2019-01-02 DIAGNOSIS — E1169 Type 2 diabetes mellitus with other specified complication: Secondary | ICD-10-CM | POA: Diagnosis not present

## 2019-01-02 DIAGNOSIS — Z0001 Encounter for general adult medical examination with abnormal findings: Secondary | ICD-10-CM

## 2019-01-02 DIAGNOSIS — E559 Vitamin D deficiency, unspecified: Secondary | ICD-10-CM | POA: Diagnosis not present

## 2019-01-02 NOTE — Patient Instructions (Addendum)
  I appreciate the opportunity to provide you with care for your health and wellness. Today we discussed: overall health   Follow up: 6 months   Labs today-since you are fasting   I hope you have a wonderful, happy, safe, and healthy Holiday Season! See you in the New Year :)  Please continue to practice social distancing to keep you, your family, and our community safe.  If you must go out, please wear a mask and practice good handwashing.  It was a pleasure to see you and I look forward to continuing to work together on your health and well-being. Please do not hesitate to call the office if you need care or have questions about your care.  Have a wonderful day and week. With Gratitude, Cherly Beach, DNP, AGNP-BC   HEALTH MAINTENANCE RECOMMENDATIONS:  It is recommended that you get at least 30 minutes of aerobic exercise at least 5 days/week (for weight loss, you may need as much as 60-90 minutes). This can be any activity that gets your heart rate up. This can be divided in 10-15 minute intervals if needed, but try and build up your endurance at least once a week.  Weight bearing exercise is also recommended twice weekly.  Eat a healthy diet with lots of vegetables, fruits and fiber.  "Colorful" foods have a lot of vitamins (ie green vegetables, tomatoes, red peppers, etc).  Limit sweet tea, regular sodas and alcoholic beverages, all of which has a lot of calories and sugar.  Up to 2 alcoholic drinks daily may be beneficial for men (unless trying to lose weight, watch sugars).  Drink a lot of water.  Sunscreen of at least SPF 30 should be used on all sun-exposed parts of the skin when outside between the hours of 10 am and 4 pm (not just when at beach or pool, but even with exercise, golf, tennis, and yard work!)  Use a sunscreen that says "broad spectrum" so it covers both UVA and UVB rays, and make sure to reapply every 1-2 hours.  Remember to change the batteries in your smoke  detectors when changing your clock times in the spring and fall.  Use your seat belt every time you are in a car, and please drive safely and not be distracted with cell phones and texting while driving.

## 2019-01-02 NOTE — Progress Notes (Signed)
Bryan Rodgers is a 58 y.o. male who presents for annual wellness visit and follow-up on chronic medical conditions.  He does not have any concerns at this time.  Immunization History  Administered Date(s) Administered  . Influenza,inj,Quad PF,6+ Mos 02/27/2014, 01/07/2015, 03/01/2017  . Pneumococcal Polysaccharide-23 08/21/2014  . Td 05/10/2005  . Tdap 09/03/2015   Last colonoscopy: 07/2016 Last PSA: Followed by Urologist Dentist: 2020 Ophtho: 2020 Exercise: Walking around at work, some weights at house   End of Life Discussion:  Patient does not have a living will and medical power of attorney.  Reports that he would like to get 1 of these reports his wife has one.   Review of Systems  Constitutional: Negative for chills.  HENT: Negative.   Eyes: Negative.  Negative for visual disturbance.  Respiratory: Negative.  Negative for cough and shortness of breath.   Cardiovascular: Negative.  Negative for chest pain, palpitations and leg swelling.  Gastrointestinal: Negative.   Endocrine: Negative.   Genitourinary: Negative.   Musculoskeletal: Negative.   Skin: Negative.   Allergic/Immunologic: Negative.   Neurological: Negative.  Negative for dizziness and headaches.  Hematological: Negative.   Psychiatric/Behavioral: Negative.   All other systems reviewed and are negative.    PHYSICAL EXAM:  BP 139/82   Pulse 60   Temp 97.8 F (36.6 C) (Oral)   Resp 15   Ht 5\' 10"  (1.778 m)   Wt 195 lb 0.6 oz (88.5 kg)   SpO2 98%   BMI 27.99 kg/m   Physical Exam Vitals signs and nursing note reviewed.  Constitutional:      Appearance: Normal appearance. He is well-developed, well-groomed and overweight.  HENT:     Head: Normocephalic and atraumatic.     Right Ear: Tympanic membrane, ear canal and external ear normal.     Left Ear: Tympanic membrane, ear canal and external ear normal.     Ears:     Weber exam findings: does not lateralize.    Right Rinne: AC > BC.  Left Rinne: AC > BC.    Comments: Cerumen build up on Left-flushed     Nose: Nose normal.     Mouth/Throat:     Lips: Pink.     Mouth: Mucous membranes are moist.     Pharynx: Oropharynx is clear. Uvula midline.  Eyes:     General:        Right eye: No discharge.        Left eye: No discharge.     Extraocular Movements: Extraocular movements intact.     Conjunctiva/sclera: Conjunctivae normal.     Pupils: Pupils are equal, round, and reactive to light.  Neck:     Musculoskeletal: Normal range of motion and neck supple.  Cardiovascular:     Rate and Rhythm: Normal rate and regular rhythm. Occasional extrasystoles are present.    Pulses: Normal pulses.     Heart sounds: Normal heart sounds.  Pulmonary:     Effort: Pulmonary effort is normal.     Breath sounds: Normal breath sounds.  Abdominal:     General: Bowel sounds are normal. There is no distension.     Palpations: Abdomen is soft.  Musculoskeletal: Normal range of motion.     Right lower leg: No edema.     Left lower leg: No edema.  Skin:    General: Skin is warm and dry.     Capillary Refill: Capillary refill takes less than 2 seconds.  Neurological:  General: No focal deficit present.     Mental Status: He is alert and oriented to person, place, and time.     Cranial Nerves: Cranial nerves are intact.     Sensory: Sensation is intact.     Motor: Motor function is intact.     Coordination: Coordination is intact.     Gait: Gait is intact.     Deep Tendon Reflexes: Reflexes are normal and symmetric.  Psychiatric:        Attention and Perception: Attention and perception normal.        Mood and Affect: Mood and affect normal.        Speech: Speech normal.        Behavior: Behavior normal. Behavior is cooperative.        Thought Content: Thought content normal.        Cognition and Memory: Cognition and memory normal.        Judgment: Judgment normal.    Ears were flush after informed consent was obtained.   Tolerated well.  TM is always being able to visualize some wax   ASSESSMENT/PLAN:  1. Annual visit for general adult medical examination with abnormal findings As documented above. Discussed PSA screening (risks/benefits), recommended at least 30 minutes of aerobic activity at least 5 days/week; proper sunscreen use reviewed; healthy diet and alcohol recommendations (less than or equal to 2 drinks/day) reviewed; regular seatbelt use; changing batteries in smoke detectors. Immunization recommendations discussed.  Colonoscopy recommendations reviewed. Had some earwax impaction in care.  Reported it bothered him on the left side.  Ears were flush after informed consent was obtained.  Tolerated well.  TM is always being able to visualize some wax and hair was removed with flush reports feeling better after having ear flush.  Updated labs ordered.  - Hemoglobin A1c - Lipid panel - CBC - BASIC METABOLIC PANEL WITH GFR - VITAMIN D 25 Hydroxy  2. Hyperlipidemia, unspecified hyperlipidemia type Encouraged heart healthy low-fat diet.  - Lipid panel  3. Essential hypertension SHAH CHARON is encouraged to maintain a well balanced diet that is low in salt. Controlled, continue current medication regimen. No refills needed.  Additionally, he is also reminded that exercise is beneficial for heart health and control of  Blood pressure. 30-60 minutes daily is recommended-walking was suggested. We will get updated labs.  - CBC - BASIC METABOLIC PANEL WITH GFR  4. Prediabetes Needs A1c recheck. Strongly encourage diet control.  Has lost some weight.  - Hemoglobin A1c  5. Need for immunization against influenza Patient was educated on the recommendation for flu vaccine. After obtaining informed consent, the vaccine was administered no adverse effects noted at time of administration. Patient provided with education on arm soreness and use of tylenol or ibuprofen (if safe) for this. Encourage  to use the arm vaccine was given in to help reduce the soreness. Patient educated on the signs of a reaction to the vaccine and advised to contact the office should these occur.     - Flu Vaccine QUAD 36+ mos IM  6. Vitamin D deficiency - VITAMIN D 25 Hydroxy     The patient's weight, height, BMI, and visual acuity have been recorded in the chart.  I have made referrals, counseling, and provided education to the patient based on review of the above and I have provided the patient with a written personalized care plan for preventive services.     Perlie Mayo, NP   01/02/2019

## 2019-01-03 LAB — BASIC METABOLIC PANEL WITH GFR
BUN: 21 mg/dL (ref 7–25)
CO2: 25 mmol/L (ref 20–32)
Calcium: 9.9 mg/dL (ref 8.6–10.3)
Chloride: 106 mmol/L (ref 98–110)
Creat: 1.02 mg/dL (ref 0.70–1.33)
GFR, Est African American: 93 mL/min/{1.73_m2} (ref >=60)
GFR, Est Non African American: 80 mL/min/{1.73_m2} (ref >=60)
Glucose, Bld: 99 mg/dL (ref 65–99)
Potassium: 4.1 mmol/L (ref 3.5–5.3)
Sodium: 139 mmol/L (ref 135–146)

## 2019-01-03 LAB — CBC
HCT: 45.1 % (ref 38.5–50.0)
Hemoglobin: 14.7 g/dL (ref 13.2–17.1)
MCH: 29.1 pg (ref 27.0–33.0)
MCHC: 32.6 g/dL (ref 32.0–36.0)
MCV: 89.3 fL (ref 80.0–100.0)
MPV: 9.9 fL (ref 7.5–12.5)
Platelets: 189 10*3/uL (ref 140–400)
RBC: 5.05 10*6/uL (ref 4.20–5.80)
RDW: 13.1 % (ref 11.0–15.0)
WBC: 7.1 10*3/uL (ref 3.8–10.8)

## 2019-01-03 LAB — LIPID PANEL
Cholesterol: 101 mg/dL (ref <200)
HDL: 37 mg/dL — ABNORMAL LOW (ref >=40)
LDL Cholesterol (Calc): 48 mg/dL (calc)
Non-HDL Cholesterol (Calc): 64 mg/dL (calc) (ref <130)
Total CHOL/HDL Ratio: 2.7 (calc) (ref <5.0)
Triglycerides: 78 mg/dL (ref <150)

## 2019-01-03 LAB — HEMOGLOBIN A1C
Hgb A1c MFr Bld: 6 % of total Hgb — ABNORMAL HIGH (ref <5.7)
Mean Plasma Glucose: 126 (calc)
eAG (mmol/L): 7 (calc)

## 2019-01-03 LAB — VITAMIN D 25 HYDROXY (VIT D DEFICIENCY, FRACTURES): Vit D, 25-Hydroxy: 21 ng/mL — ABNORMAL LOW (ref 30–100)

## 2019-01-03 NOTE — Progress Notes (Signed)
Please call Bryan Rodgers and let him know his labs are back. A1c has improved! Most likely related to his wt loss, continue good work on that. Cholesterol is overall good, need to boost exercise to increase good cholesterol numbers. Blood levels are good. Kideny's are good as well. Vitamin D is low, I suggest daily supplements to get this back up. D3 2,000-3,000 IU daily would be good. We will recheck in a year.

## 2019-01-29 ENCOUNTER — Other Ambulatory Visit: Payer: Self-pay

## 2019-01-29 MED ORDER — RIZATRIPTAN BENZOATE 10 MG PO TBDP: ORAL_TABLET | ORAL | 0 refills | Status: DC

## 2019-01-30 ENCOUNTER — Other Ambulatory Visit: Payer: Self-pay | Admitting: Family Medicine

## 2019-02-27 ENCOUNTER — Other Ambulatory Visit: Payer: Self-pay | Admitting: Family Medicine

## 2019-04-05 ENCOUNTER — Other Ambulatory Visit: Payer: Self-pay | Admitting: Family Medicine

## 2019-04-09 ENCOUNTER — Telehealth: Payer: Self-pay

## 2019-04-09 ENCOUNTER — Other Ambulatory Visit: Payer: Self-pay

## 2019-04-09 MED ORDER — RIZATRIPTAN BENZOATE 10 MG PO TBDP: ORAL_TABLET | ORAL | 0 refills | Status: DC

## 2019-04-09 NOTE — Telephone Encounter (Signed)
Refill sent for migraine med. Waiting on Jarrett Soho to see if she will refill the temazepam that was also requested

## 2019-04-09 NOTE — Telephone Encounter (Signed)
Has appt tomorrow- calling about his migraine refill

## 2019-04-09 NOTE — Telephone Encounter (Signed)
Pt is calling to see if his migraine medication was called in

## 2019-04-10 ENCOUNTER — Ambulatory Visit: Payer: BC Managed Care – PPO | Admitting: Family Medicine

## 2019-04-17 ENCOUNTER — Other Ambulatory Visit: Payer: Self-pay

## 2019-04-17 MED ORDER — RIZATRIPTAN BENZOATE 10 MG PO TBDP: ORAL_TABLET | ORAL | 0 refills | Status: DC

## 2019-04-18 ENCOUNTER — Other Ambulatory Visit: Payer: Self-pay

## 2019-04-18 ENCOUNTER — Telehealth: Payer: Self-pay

## 2019-04-18 MED ORDER — TOPIRAMATE 100 MG PO TABS: 100.0000 mg | ORAL_TABLET | Freq: Two times a day (BID) | ORAL | 5 refills | Status: DC

## 2019-04-18 NOTE — Telephone Encounter (Signed)
Pt LVM that he has not received his migraine medication

## 2019-04-18 NOTE — Telephone Encounter (Signed)
Med refilled.

## 2019-06-03 ENCOUNTER — Other Ambulatory Visit: Payer: Self-pay | Admitting: Family Medicine

## 2019-07-03 ENCOUNTER — Other Ambulatory Visit: Payer: Self-pay

## 2019-07-03 ENCOUNTER — Ambulatory Visit (INDEPENDENT_AMBULATORY_CARE_PROVIDER_SITE_OTHER): Payer: BC Managed Care – PPO | Admitting: Family Medicine

## 2019-07-03 VITALS — BP 120/82 | HR 87 | Resp 15 | Ht 70.0 in | Wt 192.1 lb

## 2019-07-03 DIAGNOSIS — M7552 Bursitis of left shoulder: Secondary | ICD-10-CM | POA: Insufficient documentation

## 2019-07-03 DIAGNOSIS — I10 Essential (primary) hypertension: Secondary | ICD-10-CM

## 2019-07-03 DIAGNOSIS — R972 Elevated prostate specific antigen [PSA]: Secondary | ICD-10-CM

## 2019-07-03 DIAGNOSIS — K219 Gastro-esophageal reflux disease without esophagitis: Secondary | ICD-10-CM

## 2019-07-03 DIAGNOSIS — R7303 Prediabetes: Secondary | ICD-10-CM

## 2019-07-03 DIAGNOSIS — F5101 Primary insomnia: Secondary | ICD-10-CM

## 2019-07-03 DIAGNOSIS — R7301 Impaired fasting glucose: Secondary | ICD-10-CM

## 2019-07-03 DIAGNOSIS — M549 Dorsalgia, unspecified: Secondary | ICD-10-CM

## 2019-07-03 MED ORDER — METOPROLOL SUCCINATE ER 25 MG PO TB24: 25.0000 mg | ORAL_TABLET | Freq: Every day | ORAL | 1 refills | Status: DC

## 2019-07-03 MED ORDER — METHYLPREDNISOLONE ACETATE 80 MG/ML IJ SUSP
80.0000 mg | Freq: Once | INTRAMUSCULAR | Status: AC
  Administered 2019-07-03: 80 mg via INTRAMUSCULAR

## 2019-07-03 MED ORDER — OMEPRAZOLE 40 MG PO CPDR: DELAYED_RELEASE_CAPSULE | ORAL | 1 refills | Status: DC

## 2019-07-03 MED ORDER — PREDNISONE 5 MG (21) PO TBPK
5.0000 mg | ORAL_TABLET | ORAL | 0 refills | Status: DC
Start: 2019-07-03 — End: 2019-10-26

## 2019-07-03 MED ORDER — KETOROLAC TROMETHAMINE 60 MG/2ML IM SOLN
60.0000 mg | Freq: Once | INTRAMUSCULAR | Status: AC
  Administered 2019-07-03: 60 mg via INTRAMUSCULAR

## 2019-07-03 MED ORDER — NAPROXEN 375 MG PO TABS: 375.0000 mg | ORAL_TABLET | Freq: Two times a day (BID) | ORAL | 0 refills | Status: DC

## 2019-07-03 MED ORDER — TEMAZEPAM 30 MG PO CAPS
30.0000 mg | ORAL_CAPSULE | Freq: Every evening | ORAL | 3 refills | Status: DC | PRN
Start: 2019-07-03 — End: 2019-12-31

## 2019-07-03 NOTE — Assessment & Plan Note (Signed)
Controlled, no change in medication DASH diet and commitment to daily physical activity for a minimum of 30 minutes discussed and encouraged, as a part of hypertension management. The importance of attaining a healthy weight is also discussed.  BP/Weight 07/03/2019 01/02/2019 12/13/2018 05/29/2018 01/18/2018 07/25/2017 0000000  Systolic BP 123456 [id number] [id number] [id number]  Diastolic BP 82 82 70 70 82 64 70  Wt. (Lbs) 192.12 195.04 207 207 213 199.12 206  BMI 27.57 27.99 29.7 29.7 30.56 28.57 29.56

## 2019-07-03 NOTE — Progress Notes (Signed)
Bryan Rodgers     MRN: [id number]      DOB: 1960/09/03   HPI [name] is here for follow up and re-evaluation of chronic medical conditions, medication management and review of any available recent lab and radiology data.  Preventive health is updated, specifically  Cancer screening and Immunization. Refuses covid vaccine  Questions or concerns regarding consultations or procedures which the PT has had in the interim are  Addressed.Follows up with Urology The PT denies any adverse reactions to current medications since the last visit.   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 Denies sinus pressure, nasal congestion, ear pain or sore throat. Denies chest congestion, productive cough or wheezing. Denies chest pains, palpitations and leg swelling Denies abdominal pain, nausea, vomiting,diarrhea or constipation.   Denies dysuria, frequency, hesitancy or incontinence. C/o chronic  joint pain, swelling and limitation in mobility. Denies headaches, seizures, numbness, or tingling. Denies depression, anxiety or insomnia. Denies skin break down or rash.   PE  BP 120/82   Pulse 87   Resp 15   Ht 5\' 10"  (1.778 m)   Wt 192 lb 1.9 oz (87.1 kg)   SpO2 97%   BMI 27.57 kg/m   Patient alert and oriented and in no cardiopulmonary distress.  HEENT: No facial asymmetry, EOMI,     Neck decreased though adequate ROM.  Chest: Clear to auscultation bilaterally.  CVS: S1, S2 no murmurs, no S3.Regular rate.  ABD: Soft non tender.   Ext: No edema  MS: Adequate ROM spine, , hips and knees.Markedly reduced ROM left shoulder Skin: Intact, no ulcerations or rash noted.  Psych: Good eye contact, normal affect. Memory intact not anxious or depressed appearing.  CNS: CN 2-12 intact, power,  normal throughout.no focal deficits  noted.   Assessment & Plan  Prediabetes Patient educated about the importance of limiting  Carbohydrate intake , the need to commit to daily physical activity for a minimum of 30 minutes , and to commit weight loss. The fact that changes in all these areas will reduce or eliminate all together the development of diabetes is stressed.   Diabetic Labs Latest Ref Rng & Units 01/02/2019 08/07/2018 08/17/2017 07/26/2017 03/01/2017  HbA1c <5.7 % of total Hgb 6.0(H) 6.3(H) 6.3(H) - 6.2(H)  Microalbumin Not Estab. ug/mL - - - 34.4(H) -  Micro/Creat Ratio 0.0 - 30.0 mg/g creat - - - - -  Chol <200 mg/dL 101 132 93 - 140  HDL > OR = 40 mg/dL 37(L) 36(L) 32(L) - 28(L)  Calc LDL mg/dL (calc) 48 74 40 - 78  Triglycerides <150 mg/dL 78 141 120 - 256(H)  Creatinine 0.70 - 1.33 mg/dL 1.02 1.07 1.04 - 0.91   BP/Weight 07/03/2019 01/02/2019 12/13/2018 05/29/2018 01/18/2018 07/25/2017 0000000  Systolic BP 123456 XX123456 [id number] [id number] XX123456 [id number] [id number]  Diastolic BP 82 82 70 70 82 64 70  Wt. (Lbs) 192.12 195.04 207 207 213 199.12 206  BMI 27.57 27.99 29.7 29.7 30.56 28.57 29.56   Foot/eye exam completion dates Latest Ref Rng & Units 07/25/2017 04/28/2016  Eye Exam No Retinopathy - -  Foot Form Completion - Done Done    Updated lab needed    Back pain with radiation Markedly improved, discontinue tramadol, use tylenol or ibuprofen as needed  HTN (hypertension) Controlled, no change in medication  DASH diet and commitment to daily physical activity for a minimum of 30 minutes discussed and encouraged, as a part of hypertension management. The importance of attaining a healthy weight is also discussed.  BP/Weight 07/03/2019 01/02/2019 12/13/2018 05/29/2018 01/18/2018 07/25/2017 0000000  Systolic BP 123456 XX123456 [id number] XX123456 [id number] [id number]  Diastolic BP 82 82 70 70 82 64 70  Wt. (Lbs) 192.12 195.04 207 207 213 199.12 206  BMI 27.57 27.99 29.7 29.7 30.56 28.57 29.56       Bursitis of left shoulder 6 month history, reports getting it on  the job Uncontrolled.Toradol and depo medrol administered IM in the office , to be followed by a short course of oral prednisone and NSAIDS.  X ray today. Refer ortho if no improvement  Graduated gentle exercise  GERD (gastroesophageal reflux disease) Controlled, no change in medication   Elevated PSA Followed by Urology  Insomnia Sleep hygiene reviewed and written information offered also. Prescription sent for  medication needed.

## 2019-07-03 NOTE — Assessment & Plan Note (Signed)
Patient educated about the importance of limiting  Carbohydrate intake , the need to commit to daily physical activity for a minimum of 30 minutes , and to commit weight loss. The fact that changes in all these areas will reduce or eliminate all together the development of diabetes is stressed.   Diabetic Labs Latest Ref Rng & Units 01/02/2019 08/07/2018 08/17/2017 07/26/2017 03/01/2017  HbA1c <5.7 % of total Hgb 6.0(H) 6.3(H) 6.3(H) - 6.2(H)  Microalbumin Not Estab. ug/mL - - - 34.4(H) -  Micro/Creat Ratio 0.0 - 30.0 mg/g creat - - - - -  Chol <200 mg/dL 101 132 93 - 140  HDL > OR = 40 mg/dL 37(L) 36(L) 32(L) - 28(L)  Calc LDL mg/dL (calc) 48 74 40 - 78  Triglycerides <150 mg/dL 78 141 120 - 256(H)  Creatinine 0.70 - 1.33 mg/dL 1.02 1.07 1.04 - 0.91   BP/Weight 07/03/2019 01/02/2019 12/13/2018 05/29/2018 01/18/2018 07/25/2017 0000000  Systolic BP 123456 XX123456 123XX123 [id number] XX123456 [id number] 123456  Diastolic BP 82 82 70 70 82 64 70  Wt. (Lbs) 192.12 195.04 207 207 213 199.12 206  BMI 27.57 27.99 29.7 29.7 30.56 28.57 29.56   Foot/eye exam completion dates Latest Ref Rng & Units 07/25/2017 04/28/2016  Eye Exam No Retinopathy - -  Foot Form Completion - Done Done    Updated lab needed

## 2019-07-03 NOTE — Assessment & Plan Note (Signed)
6 month history, reports getting it on the job Uncontrolled.Toradol and depo medrol administered IM in the office , to be followed by a short course of oral prednisone and NSAIDS.  X ray today. Refer ortho if no improvement  Graduated gentle exercise

## 2019-07-03 NOTE — Patient Instructions (Addendum)
Annual physical exam with mD early September, call if you need me sooner  Shingrix # 1 at visit  Injections, medication and X ray of left shoulder today Start gentle movement of the shoulder regularly in the next 1 week If not better in 3 weeks, call for Orthopedic referral  Labs today hBA1C, cmp and eGFR   It is important that you exercise regularly at least 30 minutes 5 times a week. If you develop chest pain, have severe difficulty breathing, or feel very tired, stop exercising immediately and seek medical attention   Congrats on improved eating habits and weight loss  Best for your retirement , and Congrats!  Thanks for choosing [facility], we consider it a privelige to serve you.   Shoulder Range of Motion Exercises Shoulder range of motion (ROM) exercises are done to keep the shoulder moving freely or to increase movement. They are often recommended for people who have shoulder pain or stiffness or who are recovering from a shoulder surgery. Phase 1 exercises When you are able, do this exercise 1-2 times per day for 30-60 seconds in each direction, or as directed by your health care provider. Pendulum exercise To do this exercise while sitting: 1. Sit in a chair or at the edge of your bed with your feet flat on the floor. 2. Let your affected arm hang down in front of you over the edge of the bed or chair. 3. Relax your shoulder, arm, and hand. Pershing your body so your arm gently swings in small circles. You can also use your unaffected arm to start the motion. 5. Repeat changing the direction of the circles, swinging your arm left and right, and swinging your arm forward and back. To do this exercise while standing: 1. Stand next to a sturdy chair or table, and hold on to it with your hand on your unaffected side. 2. Bend forward at the waist. 3. Bend your knees slightly. 4. Relax your shoulder, arm, and hand. 5. While keeping your shoulder relaxed, use body  motion to swing your arm in small circles. 6. Repeat changing the direction of the circles, swinging your arm left and right, and swinging your arm forward and back. 7. Between exercises, stand up tall and take a short break to relax your lower back.  Phase 2 exercises Do these exercises 1-2 times per day or as told by your health care provider. Hold each stretch for 30 seconds, and repeat 3 times. Do the exercises with one or both arms as instructed by your health care provider. For these exercises, sit at a table with your hand and arm supported by the table. A chair that slides easily or has wheels can be helpful. External rotation 1. Turn your chair so that your affected side is nearest to the table. 2. Place your forearm on the table to your side. Bend your elbow about 90 at the elbow (right angle) and place your hand palm facing down on the table. Your elbow should be about 6 inches away from your side. 3. Keeping your arm on the table, lean your body forward. Abduction 1. Turn your chair so that your affected side is nearest to the table. 2. Place your forearm and hand on the table so that your thumb points toward the ceiling and your arm is straight out to your side. 3. Slide your hand out to the side and away from you, using your unaffected arm to do the work. 4. To increase  the stretch, you can slide your chair away from the table. Flexion: forward stretch 1. Sit facing the table. Place your hand and elbow on the table in front of you. 2. Slide your hand forward and away from you, using your unaffected arm to do the work. 3. To increase the stretch, you can slide your chair backward. Phase 3 exercises Do these exercises 1-2 times per day or as told by your health care provider. Hold each stretch for 30 seconds, and repeat 3 times. Do the exercises with one or both arms as instructed by your health care provider. Cross-body stretch: posterior capsule stretch 1. Lift your arm straight  out in front of you. 2. Bend your arm 90 at the elbow (right angle) so your forearm moves across your body. 3. Use your other arm to gently pull the elbow across your body, toward your other shoulder. Wall climbs 1. Stand with your affected arm extended out to the side with your hand resting on a door frame. 2. Slide your hand slowly up the door frame. 3. To increase the stretch, step through the door frame. Keep your body upright and do not lean. Wand exercises You will need a cane, a piece of PVC pipe, or a sturdy wooden dowel for wand exercises. Flexion To do this exercise while standing: 1. Hold the wand with both of your hands, palms down. 2. Using the other arm to help, lift your arms up and over your head, if able. 3. Push upward with your other arm to gently increase the stretch. To do this exercise while lying down: 1. Lie on your back with your elbows resting on the floor and the wand in both your hands. Your hands will be palm down, or pointing toward your feet. 2. Lift your hands toward the ceiling, using your unaffected arm to help if needed. 3. Bring your arms overhead as able, using your unaffected arm to help if needed. Internal rotation 1. Stand while holding the wand behind you with both hands. Your unaffected arm should be extended above your head with the arm of the affected side extended behind you at the level of your waist. The wand should be pointing straight up and down as you hold it. 2. Slowly pull the wand up behind your back by straightening the elbow of your unaffected arm and bending the elbow of your affected arm. External rotation 1. Lie on your back with your affected upper arm supported on a small pillow or rolled towel. When you first do this exercise, keep your upper arm close to your body. Over time, bring your arm up to a 90 angle out to the side. 2. Hold the wand across your stomach and with both hands palm up. Your elbow on your affected side should  be bent at a 90 angle. 3. Use your unaffected side to help push your forearm away from you and toward the floor. Keep your elbow on your affected side bent at a 90 angle. Contact a health care provider if you have:  New or increasing pain.  New numbness, tingling, weakness, or discoloration in your arm or hand. This information is not intended to replace advice given to you by your health care provider. Make sure you discuss any questions you have with your health care provider. Document Revised: 03/01/2017 Document Reviewed: 03/01/2017 Elsevier Patient Education  2020 [employer].

## 2019-07-03 NOTE — Assessment & Plan Note (Signed)
Markedly improved, discontinue tramadol, use tylenol or ibuprofen as needed

## 2019-07-04 ENCOUNTER — Encounter: Payer: Self-pay | Admitting: Family Medicine

## 2019-07-04 LAB — COMPLETE METABOLIC PANEL WITH GFR
AG Ratio: 1.8 (calc) (ref 1.0–2.5)
ALT: 20 U/L (ref 9–46)
AST: 21 U/L (ref 10–35)
Albumin: 4.5 g/dL (ref 3.6–5.1)
Alkaline phosphatase (APISO): 132 U/L (ref 35–144)
BUN: 18 mg/dL (ref 7–25)
CO2: 24 mmol/L (ref 20–32)
Calcium: 9.6 mg/dL (ref 8.6–10.3)
Chloride: 108 mmol/L (ref 98–110)
Creat: 1.1 mg/dL (ref 0.70–1.33)
GFR, Est African American: 85 mL/min/{1.73_m2} (ref >=60)
GFR, Est Non African American: 73 mL/min/{1.73_m2} (ref >=60)
Globulin: 2.5 g/dL (calc) (ref 1.9–3.7)
Glucose, Bld: 84 mg/dL (ref 65–139)
Potassium: 4.2 mmol/L (ref 3.5–5.3)
Sodium: 140 mmol/L (ref 135–146)
Total Bilirubin: 0.5 mg/dL (ref 0.2–1.2)
Total Protein: 7 g/dL (ref 6.1–8.1)

## 2019-07-04 LAB — HEMOGLOBIN A1C
Hgb A1c MFr Bld: 5.9 % of total Hgb — ABNORMAL HIGH (ref <5.7)
Mean Plasma Glucose: 123 (calc)
eAG (mmol/L): 6.8 (calc)

## 2019-07-05 ENCOUNTER — Encounter: Payer: Self-pay | Admitting: Family Medicine

## 2019-07-05 NOTE — Assessment & Plan Note (Signed)
Controlled, no change in medication  

## 2019-07-05 NOTE — Assessment & Plan Note (Signed)
Sleep hygiene reviewed and written information offered also. Prescription sent for  medication needed.  

## 2019-07-05 NOTE — Assessment & Plan Note (Signed)
Followed by Urology 

## 2019-07-31 ENCOUNTER — Other Ambulatory Visit: Payer: Self-pay | Admitting: Family Medicine

## 2019-08-26 ENCOUNTER — Other Ambulatory Visit: Payer: Self-pay

## 2019-08-26 MED ORDER — METOPROLOL SUCCINATE ER 25 MG PO TB24: 25.0000 mg | ORAL_TABLET | Freq: Every day | ORAL | 1 refills | Status: DC

## 2019-08-26 MED ORDER — OMEPRAZOLE 40 MG PO CPDR: DELAYED_RELEASE_CAPSULE | ORAL | 1 refills | Status: DC

## 2019-10-21 ENCOUNTER — Other Ambulatory Visit: Payer: Self-pay

## 2019-10-21 ENCOUNTER — Ambulatory Visit (INDEPENDENT_AMBULATORY_CARE_PROVIDER_SITE_OTHER): Payer: BC Managed Care – PPO | Admitting: Family Medicine

## 2019-10-21 VITALS — BP 110/70 | HR 71 | Ht 69.5 in | Wt 196.0 lb

## 2019-10-21 DIAGNOSIS — R443 Hallucinations, unspecified: Secondary | ICD-10-CM | POA: Diagnosis not present

## 2019-10-21 DIAGNOSIS — Z0000 Encounter for general adult medical examination without abnormal findings: Secondary | ICD-10-CM | POA: Diagnosis not present

## 2019-10-21 DIAGNOSIS — G43109 Migraine with aura, not intractable, without status migrainosus: Secondary | ICD-10-CM | POA: Diagnosis not present

## 2019-10-21 DIAGNOSIS — M549 Dorsalgia, unspecified: Secondary | ICD-10-CM | POA: Diagnosis not present

## 2019-10-21 DIAGNOSIS — Z23 Encounter for immunization: Secondary | ICD-10-CM | POA: Diagnosis not present

## 2019-10-21 DIAGNOSIS — B353 Tinea pedis: Secondary | ICD-10-CM | POA: Diagnosis not present

## 2019-10-21 MED ORDER — KETOROLAC TROMETHAMINE 60 MG/2ML IM SOLN
60.0000 mg | Freq: Once | INTRAMUSCULAR | Status: AC
Start: 2019-10-21 — End: 2019-10-21
  Administered 2019-10-21: 60 mg via INTRAMUSCULAR

## 2019-10-21 MED ORDER — METHYLPREDNISOLONE ACETATE 80 MG/ML IJ SUSP
80.0000 mg | Freq: Once | INTRAMUSCULAR | Status: AC
  Administered 2019-10-21: 80 mg via INTRAMUSCULAR

## 2019-10-26 ENCOUNTER — Telehealth: Payer: Self-pay | Admitting: Family Medicine

## 2019-10-26 ENCOUNTER — Encounter: Payer: Self-pay | Admitting: Family Medicine

## 2019-10-26 DIAGNOSIS — B353 Tinea pedis: Secondary | ICD-10-CM | POA: Insufficient documentation

## 2019-10-26 DIAGNOSIS — R443 Hallucinations, unspecified: Secondary | ICD-10-CM | POA: Insufficient documentation

## 2019-10-26 MED ORDER — TERBINAFINE HCL 250 MG PO TABS: 250.0000 mg | ORAL_TABLET | Freq: Every day | ORAL | 0 refills | Status: DC

## 2019-10-26 MED ORDER — PREDNISONE 5 MG (21) PO TBPK: 5.0000 mg | ORAL_TABLET | ORAL | 0 refills | Status: DC

## 2019-10-26 NOTE — Assessment & Plan Note (Signed)
Terbinafine x 6 weeks

## 2019-10-26 NOTE — Progress Notes (Signed)
   Bryan Rodgers     MRN: [id number]      DOB: [birth year]   HPI: Patient is in for annual physical exam. C/o cluster migraines for past 3 months C/o uncontrolled back pain x 2 weeks, has been involved with moving his daughter in law, also c/o bilateral shoulder pain C/o itchy rash between left 4th and 5th toes x 6 weeks C/o auditory and visual hallucinations x 2 weeks, states he has a sibling who is schizophrenic, he is not homicidal or suicidal Recent labs, if available are reviewed. Immunization is reviewed , and  updated .    PE; BP 110/70   Pulse 71   Ht 5' 9.5" (1.765 m)   Wt 196 lb (88.9 kg)   SpO2 97%   BMI 28.53 kg/m  Pleasant male, alert and oriented x 3, in no cardio-pulmonary distress. Afebrile. HEENT No facial trauma or asymetry. Sinuses non tender. EOMI External ears normal,  Neck: supple, no adenopathy,JVD or thyromegaly.No bruits.  Chest: Clear to ascultation bilaterally.No crackles or wheezes. Non tender to palpation  Cardiovascular system; Heart sounds normal,  S1 and  S2 ,no S3.  No murmur, or thrill. Apical beat not displaced Peripheral pulses normal.  Abdomen: Soft, non tender, no organomegaly or masses. No bruits. Bowel sounds normal. No guarding, tenderness or rebound.    Musculoskeletal exam: Decreased ROM of lumbar spine, adequate in hips , and knees.Decreased in shoulders No deformity ,swelling or crepitus noted. No muscle wasting or atrophy.   Neurologic: Cranial nerves 2 to 12 intact. Power, tone ,sensation and reflexes normal throughout. No disturbance in gait. No tremor.  Skin: Fungal  rash noted between 4th and 5th left toes Pigmentation normal throughout  Psych; Normal mood and affect. Judgement and concentration normal   Assessment & Plan:  Annual physical exam Annual exam as documented. Counseling done  re healthy lifestyle involving commitment to 150 minutes exercise per week, heart healthy diet, and attaining  healthy weight.The importance of adequate sleep also discussed. Regular seat belt use and home safety, is also discussed. Changes in health habits are decided on by the patient with goals and time frames  set for achieving them. Immunization and cancer screening needs are specifically addressed at this visit.   Back pain with radiation Uncontrolled.Toradol and depo medrol administered IM in the office , to be followed by a short course of oral prednisone and bedtime tramadol.   Tinea pedis Terbinafine x 6 weeks  Migraine headache Currently uncontrolled with cluster Uncontrolled.Toradol and depo medrol administered IM in the office , to be followed by a short course of oral prednisone   Hallucinations 2 week h/o auditory and visual hallucinations, non threatening, which is new. Positive f/h of schizophrenia. MRI brain and refer Psych

## 2019-10-26 NOTE — Assessment & Plan Note (Signed)
Uncontrolled.Toradol and depo medrol administered IM in the office , to be followed by a short course of oral prednisone and bedtime tramadol.

## 2019-10-26 NOTE — Assessment & Plan Note (Signed)
Currently uncontrolled with cluster Uncontrolled.Toradol and depo medrol administered IM in the office , to be followed by a short course of oral prednisone

## 2019-10-26 NOTE — Patient Instructions (Addendum)
F/U in office with MD in 8 to 10 weeks shingrix #1 at visit, call if you need me before  Injections, Toradol and DepoMedrol at visit today for uncontrolled back pain and headache  Flu vaccine at visit.  You are referred for brain scan and also to psychiatry because of new hallucinations 6 day course of prednisone is prescribed for back poain and headahces  Terbinafine tablets are prescribed for 6 weeks for fungal infection  Tramadol is prescribed for back pain   Fasting CBC, lipid, cmp and EGFr, TSh and vit D 5 days before next visit  It is important that you exercise regularly at least 30 minutes 5 times a week. If you develop chest pain, have severe difficulty breathing, or feel very tired, stop exercising immediately and seek medical attention  Think about what you will eat, plan ahead. Choose " clean, green, fresh or frozen" over canned, processed or packaged foods which are more sugary, salty and fatty. 70 to 75% of food eaten should be vegetables and fruit. Three meals at set times with snacks allowed between meals, but they must be fruit or vegetables. Aim to eat over a 12 hour period , example 7 am to 7 pm, and STOP after  your last meal of the day. Drink water,generally about 64 ounces per day, no other drink is as healthy. Fruit juice is best enjoyed in a healthy way, by EATING the fruit.   Thanks for choosing [facility], we consider it a privelige to serve you.

## 2019-10-26 NOTE — Assessment & Plan Note (Signed)
2 week h/o auditory and visual hallucinations, non threatening, which is new. Positive f/h of schizophrenia. MRI brain and refer Psych

## 2019-10-26 NOTE — Assessment & Plan Note (Signed)

## 2019-10-26 NOTE — Telephone Encounter (Signed)
pls call pt and let him know that from visit on 09/20 when we had no power, prednisone has been prescribed , terbinafine x 6 weeks and tramadol at bedtime for back pain. He is referred fpor a brain scan due to new hallucinations and daily headaches He is also referred to Psychiatry re new hallucinations , both visual and auditory as we discussed at the visit. Pls mail printed d/c summary and order the labs written to be drawn prior to next appt, thanks

## 2019-11-01 ENCOUNTER — Other Ambulatory Visit: Payer: Self-pay

## 2019-11-01 NOTE — Telephone Encounter (Signed)
Spoke with spouse and gave information with verbal understanding.

## 2019-11-18 ENCOUNTER — Other Ambulatory Visit: Payer: Self-pay | Admitting: Family Medicine

## 2019-11-25 ENCOUNTER — Telehealth: Payer: Self-pay

## 2019-11-25 ENCOUNTER — Other Ambulatory Visit: Payer: Self-pay

## 2019-11-25 DIAGNOSIS — R21 Rash and other nonspecific skin eruption: Secondary | ICD-10-CM

## 2019-11-25 DIAGNOSIS — L309 Dermatitis, unspecified: Secondary | ICD-10-CM

## 2019-11-25 NOTE — Telephone Encounter (Signed)
Patient calling to see if you will prescribe him Dupixene for his skin. Has seen a lot of commercials about it. Would you like to refer him to allergy?

## 2019-11-25 NOTE — Telephone Encounter (Signed)
Recommend Specialist opinion, dermatology should be appropriate, pls refer, I will sign, dx is rash/ dermatitis

## 2019-11-25 NOTE — Telephone Encounter (Signed)
Referral entered.  Patient aware.  

## 2019-11-26 ENCOUNTER — Other Ambulatory Visit: Payer: Self-pay | Admitting: Family Medicine

## 2019-11-27 ENCOUNTER — Other Ambulatory Visit: Payer: Self-pay

## 2019-11-27 ENCOUNTER — Ambulatory Visit (HOSPITAL_COMMUNITY)
Admission: RE | Admit: 2019-11-27 | Discharge: 2019-11-27 | Disposition: A | Discharge status: Home / Self Care | Payer: BC Managed Care – PPO | Source: Ambulatory Visit | Attending: Family Medicine | Admitting: Family Medicine

## 2019-11-27 DIAGNOSIS — G43109 Migraine with aura, not intractable, without status migrainosus: Secondary | ICD-10-CM | POA: Insufficient documentation

## 2019-11-27 DIAGNOSIS — F29 Unspecified psychosis not due to a substance or known physiological condition: Secondary | ICD-10-CM | POA: Diagnosis not present

## 2019-11-27 DIAGNOSIS — G9389 Other specified disorders of brain: Secondary | ICD-10-CM | POA: Diagnosis not present

## 2019-11-27 DIAGNOSIS — R443 Hallucinations, unspecified: Secondary | ICD-10-CM | POA: Insufficient documentation

## 2019-11-27 DIAGNOSIS — R519 Headache, unspecified: Secondary | ICD-10-CM | POA: Diagnosis not present

## 2019-11-27 DIAGNOSIS — H748X1 Other specified disorders of right middle ear and mastoid: Secondary | ICD-10-CM | POA: Diagnosis not present

## 2019-11-28 ENCOUNTER — Other Ambulatory Visit: Payer: Self-pay

## 2019-11-28 DIAGNOSIS — F29 Unspecified psychosis not due to a substance or known physiological condition: Secondary | ICD-10-CM

## 2019-11-28 DIAGNOSIS — R9402 Abnormal brain scan: Secondary | ICD-10-CM

## 2019-12-19 ENCOUNTER — Encounter (HOSPITAL_COMMUNITY): Payer: Self-pay

## 2019-12-19 ENCOUNTER — Emergency Department (HOSPITAL_COMMUNITY)
Admission: EM | Admit: 2019-12-19 | Discharge: 2019-12-20 | Disposition: A | Discharge status: Home / Self Care | Payer: BC Managed Care – PPO | Attending: Emergency Medicine | Admitting: Emergency Medicine

## 2019-12-19 ENCOUNTER — Other Ambulatory Visit: Payer: Self-pay

## 2019-12-19 DIAGNOSIS — Z87891 Personal history of nicotine dependence: Secondary | ICD-10-CM | POA: Insufficient documentation

## 2019-12-19 DIAGNOSIS — R112 Nausea with vomiting, unspecified: Secondary | ICD-10-CM | POA: Diagnosis not present

## 2019-12-19 DIAGNOSIS — J45909 Unspecified asthma, uncomplicated: Secondary | ICD-10-CM | POA: Diagnosis not present

## 2019-12-19 DIAGNOSIS — R3589 Other polyuria: Secondary | ICD-10-CM | POA: Diagnosis not present

## 2019-12-19 DIAGNOSIS — I10 Essential (primary) hypertension: Secondary | ICD-10-CM | POA: Insufficient documentation

## 2019-12-19 DIAGNOSIS — E119 Type 2 diabetes mellitus without complications: Secondary | ICD-10-CM | POA: Insufficient documentation

## 2019-12-19 DIAGNOSIS — K760 Fatty (change of) liver, not elsewhere classified: Secondary | ICD-10-CM | POA: Diagnosis not present

## 2019-12-19 DIAGNOSIS — R1032 Left lower quadrant pain: Secondary | ICD-10-CM | POA: Diagnosis not present

## 2019-12-19 DIAGNOSIS — N200 Calculus of kidney: Secondary | ICD-10-CM | POA: Diagnosis not present

## 2019-12-19 NOTE — ED Triage Notes (Signed)
Pt reports nausea that started last night and has continued through today. Pt says the pain started tonight located in LLQ with 4-5 loose stools today.

## 2019-12-20 ENCOUNTER — Emergency Department (HOSPITAL_COMMUNITY): Payer: BC Managed Care – PPO

## 2019-12-20 DIAGNOSIS — K760 Fatty (change of) liver, not elsewhere classified: Secondary | ICD-10-CM | POA: Diagnosis not present

## 2019-12-20 LAB — CBC WITH DIFFERENTIAL/PLATELET
Abs Immature Granulocytes: 0.04 10*3/uL (ref 0.00–0.07)
Basophils Absolute: 0.1 10*3/uL (ref 0.0–0.1)
Basophils Relative: 1 %
Eosinophils Absolute: 0.3 10*3/uL (ref 0.0–0.5)
Eosinophils Relative: 3 %
HCT: 45.8 % (ref 39.0–52.0)
Hemoglobin: 14.6 g/dL (ref 13.0–17.0)
Immature Granulocytes: 0 %
Lymphocytes Relative: 39 %
Lymphs Abs: 3.8 10*3/uL (ref 0.7–4.0)
MCH: 30 pg (ref 26.0–34.0)
MCHC: 31.9 g/dL (ref 30.0–36.0)
MCV: 94.2 fL (ref 80.0–100.0)
Monocytes Absolute: 0.9 10*3/uL (ref 0.1–1.0)
Monocytes Relative: 9 %
Neutro Abs: 4.5 10*3/uL (ref 1.7–7.7)
Neutrophils Relative %: 48 %
Platelets: 180 10*3/uL (ref 150–400)
RBC: 4.86 MIL/uL (ref 4.22–5.81)
RDW: 14.1 % (ref 11.5–15.5)
WBC: 9.5 10*3/uL (ref 4.0–10.5)
nRBC: 0 % (ref 0.0–0.2)

## 2019-12-20 LAB — COMPREHENSIVE METABOLIC PANEL
ALT: 24 U/L (ref 0–44)
AST: 23 U/L (ref 15–41)
Albumin: 4.4 g/dL (ref 3.5–5.0)
Alkaline Phosphatase: 123 U/L (ref 38–126)
Anion gap: 7 (ref 5–15)
BUN: 20 mg/dL (ref 6–20)
CO2: 20 mmol/L — ABNORMAL LOW (ref 22–32)
Calcium: 9 mg/dL (ref 8.9–10.3)
Chloride: 113 mmol/L — ABNORMAL HIGH (ref 98–111)
Creatinine, Ser: 1.23 mg/dL (ref 0.61–1.24)
GFR, Estimated: >60 mL/min (ref >60)
Glucose, Bld: 99 mg/dL (ref 70–99)
Potassium: 3.5 mmol/L (ref 3.5–5.1)
Sodium: 140 mmol/L (ref 135–145)
Total Bilirubin: 0.4 mg/dL (ref 0.3–1.2)
Total Protein: 7.6 g/dL (ref 6.5–8.1)

## 2019-12-20 LAB — URINALYSIS, ROUTINE W REFLEX MICROSCOPIC
Bacteria, UA: NONE SEEN
Bilirubin Urine: NEGATIVE
Glucose, UA: NEGATIVE mg/dL
Ketones, ur: NEGATIVE mg/dL
Leukocytes,Ua: NEGATIVE
Nitrite: NEGATIVE
Protein, ur: NEGATIVE mg/dL
RBC / HPF: >50 RBC/hpf — ABNORMAL HIGH (ref 0–5)
Specific Gravity, Urine: 1.021 (ref 1.005–1.030)
pH: 7 (ref 5.0–8.0)

## 2019-12-20 LAB — LIPASE, BLOOD: Lipase: 26 U/L (ref 11–51)

## 2019-12-20 MED ORDER — KETOROLAC TROMETHAMINE 30 MG/ML IJ SOLN
30.0000 mg | Freq: Once | INTRAMUSCULAR | Status: AC
  Administered 2019-12-20: 30 mg via INTRAVENOUS
  Filled 2019-12-20: qty 1

## 2019-12-20 MED ORDER — OXYCODONE-ACETAMINOPHEN 5-325 MG PO TABS: 1.0000 | ORAL_TABLET | ORAL | 0 refills | Status: DC | PRN

## 2019-12-20 MED ORDER — ONDANSETRON 4 MG PO TBDP: ORAL_TABLET | ORAL | 0 refills | Status: DC

## 2019-12-20 MED ORDER — ONDANSETRON HCL 4 MG PO TABS: 4.0000 mg | ORAL_TABLET | Freq: Three times a day (TID) | ORAL | 0 refills | Status: DC | PRN

## 2019-12-20 MED ORDER — HYDROMORPHONE HCL 1 MG/ML IJ SOLN
0.5000 mg | Freq: Once | INTRAMUSCULAR | Status: AC
  Administered 2019-12-20: 0.5 mg via INTRAVENOUS
  Filled 2019-12-20: qty 1

## 2019-12-20 MED ORDER — SODIUM CHLORIDE 0.9 % IV BOLUS
1000.0000 mL | Freq: Once | INTRAVENOUS | Status: AC
  Administered 2019-12-20: 1000 mL via INTRAVENOUS

## 2019-12-20 MED ORDER — HYDROMORPHONE HCL 1 MG/ML IJ SOLN
1.0000 mg | Freq: Once | INTRAMUSCULAR | Status: AC
  Administered 2019-12-20: 1 mg via INTRAVENOUS
  Filled 2019-12-20: qty 1

## 2019-12-20 MED ORDER — TAMSULOSIN HCL 0.4 MG PO CAPS
0.4000 mg | ORAL_CAPSULE | Freq: Every day | ORAL | 0 refills | Status: DC
Start: 2019-12-20 — End: 2020-08-25

## 2019-12-20 MED ORDER — IOHEXOL 300 MG/ML  SOLN
100.0000 mL | Freq: Once | INTRAMUSCULAR | Status: AC | PRN
  Administered 2019-12-20: 100 mL via INTRAVENOUS

## 2019-12-20 MED ORDER — OXYCODONE-ACETAMINOPHEN 5-325 MG PO TABS: 2.0000 | ORAL_TABLET | ORAL | 0 refills | Status: DC | PRN

## 2019-12-20 MED ORDER — IBUPROFEN 800 MG PO TABS: 800.0000 mg | ORAL_TABLET | Freq: Three times a day (TID) | ORAL | 0 refills | Status: DC

## 2019-12-20 MED ORDER — TAMSULOSIN HCL 0.4 MG PO CAPS
0.4000 mg | ORAL_CAPSULE | Freq: Once | ORAL | Status: AC
  Administered 2019-12-20: 0.4 mg via ORAL
  Filled 2019-12-20: qty 1

## 2019-12-20 MED ORDER — ONDANSETRON HCL 4 MG/2ML IJ SOLN
4.0000 mg | Freq: Once | INTRAMUSCULAR | Status: AC
  Administered 2019-12-20: 4 mg via INTRAVENOUS
  Filled 2019-12-20: qty 2

## 2019-12-20 NOTE — ED Notes (Signed)
Patient discharged to home.  Ambulatory out of ED.  Patient receptive to discharge instructions and demonstrated knowledge/understanding via teachback method.  0 s/s acute distress.

## 2019-12-20 NOTE — ED Notes (Signed)
Patient requests "Can you just give me something to knock me out?"  Dilaudid administered per order.  Patient educated on goals of pain management while in the hospital.

## 2019-12-20 NOTE — ED Provider Notes (Signed)
Emergency Department Provider Note  I have reviewed the triage vital signs and the nursing notes.  HISTORY  Chief Complaint Abdominal Pain (LLQ)   HPI Bryan Rodgers is a 59 y.o. male with medical problems as documented below who presents to the emergency department today with abdominal pain. Patient states that started earlier in the day. His left lower quadrant in nature. Sharp. It is associated with nausea vomiting. Initially started as brownish diarrhea now having green diarrhea. No fevers. No sick contacts. No weird food intake. Patient states he never anything at this before. He also has polyuria and thinks he might have a urinary tract infection because of that symptom. Pain does not seem to radiate. No trauma. No rash.   No other associated or modifying symptoms.    Past Medical History:  Diagnosis Date  . Alcoholic ([place]) quit 3846   10-15 beers per day on the weekends   . Anemia   . Arthritis   . Asthma    childhood asthma up to age 68  . Chest pain    Palpations  . Dyspnea   . Elevated CPK   . Headache   . Hemorrhoids    with bleeding   . History of colonic polyps    removed via colonscope  . Hypertension   . Overweight (BMI 25.0-29.9)   . Prediabetes 10/2012   HBA1C is 6.4  . Tobacco abuse     Patient Active Problem List   Diagnosis Date Noted  . Tinea pedis 10/26/2019  . Hallucinations 10/26/2019  . Seasonal allergies 12/16/2018  . Prediabetes 12/16/2018  . Insomnia 07/26/2017  . Hyperlipidemia associated with type 2 diabetes mellitus ([place]) 03/05/2017  . Annual physical exam 01/07/2015  . Overweight (BMI 25.0-29.9) 08/22/2014  . Migraine headache 07/23/2014  . Back pain with radiation 01/14/2013  . Elevated PSA 08/11/2011  . Vitamin D deficiency 03/11/2011  . GERD (gastroesophageal reflux disease) 03/10/2011  . Arthritis 03/10/2011  . HTN (hypertension) 07/08/2010  . Internal hemorrhoids 02/09/2010  . ADENOMATOUS COLONIC POLYP 09/07/2009  .  FATTY LIVER DISEASE 09/07/2009  . GOITER, UNSPECIFIED 02/24/2008    Past Surgical History:  Procedure Laterality Date  . CATARACT EXTRACTION W/ INTRAOCULAR LENS IMPLANT Bilateral 2012  . COLONOSCOPY  2007   Dr. Oneida Alar: internal hemorrhoids, otherwise normal  . COLONOSCOPY  2004   reported history of colon polyp  . COLONOSCOPY WITH PROPOFOL N/A 07/26/2016   Procedure: COLONOSCOPY WITH PROPOFOL;  Surgeon: Danie Binder, MD;  [facility];  Service: Endoscopy;  Laterality: N/A;  830   . hemmorrhoidectomy  2002  . POLYPECTOMY  07/26/2016   Procedure: POLYPECTOMY;  Surgeon: Danie Binder, MD;  Location: [facility];  Service: Endoscopy;;  splenic flexure polyps x4    Current Outpatient Rx  . Order #: [id number] Class: Normal  . Order #: [id number] Class: Historical Med  . Order #: [id number] Class: Normal  . Order #: [id number] Class: Normal  . Order #: [id number] Class: Normal  . Order #: [id number] Class: Normal  . Order #: [id number] Class: Normal  . Order #: [id number] Class: Normal  . Order #: [id number] Class: Normal  . Order #: [id number] Class: Normal  . Order #: [id number] Class: Normal  . Order #: [id number] Class: Print  . Order #: [id number] Class: Normal  . Order #: [id number] Class: Print  . Order #: [id number] Class: Normal  . Order #: [id number] Class: Normal  . Order #: [id number] Class: Normal  . Order #: [id number] Class: Normal  . Order #: [id number] Class: Normal  . Order #:  [id number] Class: Normal  . Order #: [id number] Class: Normal  . Order #: [id number] Class: Normal    Allergies Patient has no known allergies.  Family History  Problem Relation Age of Onset  . Cancer Mother   . Kidney disease Brother   . Cancer Sister   . Diabetes Sister   . Colon cancer Neg Hx     Social History Social History   Tobacco Use  . Smoking status: Former Smoker    Quit date: 03/16/2012    Years since quitting: 7.7  . Smokeless tobacco: Never Used  Substance Use Topics  . Alcohol  use: No    Alcohol/week: 0.0 standard drinks    Comment: History of alcohol use with beer on weekends, none since 2014   . Drug use: No    Review of Systems  All other systems negative except as documented in the HPI. All pertinent positives and negatives as reviewed in the HPI. ____________________________________________  PHYSICAL EXAM:  VITAL SIGNS: ED Triage Vitals  Enc Vitals Group     BP 12/19/19 2337 (!) 172/86     Pulse Rate 12/19/19 2337 62     Resp 12/19/19 2337 20     Temp 12/19/19 2337 97.6 F (36.4 C)     Temp Source 12/19/19 2337 Oral     SpO2 12/19/19 2337 99 %     Weight 12/19/19 2338 189 lb (85.7 kg)     Height 12/19/19 2338 5\' 10"  (1.778 m)    Constitutional: Alert and oriented. Well appearing and in no acute distress. Eyes: Conjunctivae are normal. PERRL. EOMI. Head: Atraumatic. Nose: No congestion/rhinnorhea. Mouth/Throat: Mucous membranes are moist.  Oropharynx non-erythematous. Neck: No stridor.  No meningeal signs.   Cardiovascular: Normal rate, regular rhythm. Good peripheral circulation. Grossly normal heart sounds.   Respiratory: Normal respiratory effort.  No retractions. Lungs CTAB. Gastrointestinal: Soft and ttp in LLQ. No distention.  Musculoskeletal: No lower extremity tenderness nor edema. No gross deformities of extremities. Neurologic:  Normal speech and language. No gross focal neurologic deficits are appreciated.  Skin:  Skin is warm, dry and intact. No rash noted.  ____________________________________________   LABS (all labs ordered are listed, but only abnormal results are displayed)  Labs Reviewed  COMPREHENSIVE METABOLIC PANEL - Abnormal; Notable for the following components:      Result Value   Chloride 113 (*)    CO2 20 (*)    All other components within normal limits  URINALYSIS, ROUTINE W REFLEX MICROSCOPIC - Abnormal; Notable for the following components:   Hgb urine dipstick MODERATE (*)    RBC / HPF >50 (*)    All  other components within normal limits  C DIFFICILE QUICK SCREEN W PCR REFLEX  GASTROINTESTINAL PANEL BY PCR, STOOL (REPLACES STOOL CULTURE)  LIPASE, BLOOD  CBC WITH DIFFERENTIAL/PLATELET   ____________________________________________  EKG   EKG Interpretation  Date/Time:    Ventricular Rate:    PR Interval:    QRS Duration:   QT Interval:    QTC Calculation:   R Axis:     Text Interpretation:         ____________________________________________  RADIOLOGY  CT ABDOMEN PELVIS W CONTRAST  Result Date: 12/20/2019 CLINICAL DATA:  Nausea, left lower quadrant pain, loose stools EXAM: CT ABDOMEN AND PELVIS WITH CONTRAST TECHNIQUE: Multidetector CT imaging of the abdomen and pelvis was performed using the standard protocol following bolus administration of intravenous contrast. CONTRAST:  128mL OMNIPAQUE IOHEXOL 300 MG/ML  SOLN COMPARISON:  CT 08/19/2009 FINDINGS:  Lower chest: Cystic lung disease noted in the bases demonstrating numerous thin-walled cysts of varying sizes rather than emphysematous change. No consolidation. Dependent atelectasis. Top-normal cardiac size with right atrial enlargement. No pericardial effusion. Hepatobiliary: Diffuse hepatic hypoattenuation compatible with hepatic steatosis. Sparing along the gallbladder fossa. Smooth liver surface contour. No concerning focal liver lesion. Normal gallbladder and biliary tree. Pancreas: No pancreatic ductal dilatation or surrounding inflammatory changes. Spleen: Normal in size. No concerning splenic lesions. Adrenals/Urinary Tract: Normal adrenal glands. There is a delayed left nephrogram with asymmetric left perinephric and periureteral stranding and mild to moderate hydroureteronephrosis to the level of a small linear density partially protruding into the left ureterovesicular junction. This finding best seen on coronal and sagittal reconstructions (5/73, 6/63), reflecting an elongated calcification measuring up to 2 mm in  diameter. Additional punctate nonobstructing calculus seen in the upper pole left kidney. No other urolithiasis or right urinary tract dilatation. Indentation of bladder base by mildly enlarged prostate. Urinary bladder is otherwise unremarkable. Stomach/Bowel: Distal esophagus, stomach and duodenum are unremarkable. No small bowel thickening or dilatation. A normal appendix is visualized. No colonic dilatation or wall thickening. Scattered colonic diverticula without focal inflammation to suggest diverticulitis. Vascular/Lymphatic: Atherosclerotic calcifications within the abdominal aorta and branch vessels. No aneurysm or ectasia. No enlarged abdominopelvic lymph nodes. Reproductive: Mild prostatomegaly. No concerning focal lesions. Seminal vesicles are unremarkable. Other: No abdominopelvic free fluid or free gas. No bowel containing hernias. Mild ventral rectus diastasis. Musculoskeletal: No acute or worrisome osseous lesions. Degenerative changes in the spine and pelvis. Partial ankylosis of the left SI joint, progressive since 20/11 comparison. Mild levocurvature of the lumbar levels, apex L3-4. IMPRESSION: 1. Mild-to-moderate high left hydroureteronephrosis to an elongated calculus at the left UVJ measuring up to 2 mm in diameter. Asymmetric left renal stranding and delayed nephrogram are likely physiologic response to obstructive uropathy though could correlate with urinalysis to exclude a superimposed urinary tract infection. 2. Additional punctate nonobstructing calculus in the upper pole left kidney. 3. Hepatic steatosis. 4. Numerous thin-walled cysts are noted in the lung bases which could reflect a cystic lung disease of indeterminate etiology. Emphysema less favored. Consider outpatient evaluation with dedicated HRCT chest imaging. 5. Mild prostatomegaly. 6. Aortic Atherosclerosis (ICD10-I70.0) Electronically Signed   By: Lovena Le M.D.   On: 12/20/2019 02:25    ____________________________________________  PROCEDURES  Procedure(s) performed:   Procedures ____________________________________________  INITIAL IMPRESSION / ASSESSMENT AND PLAN    This patient presents to the ED for concern of abdominal pain, this involves an extensive number of treatment options, and is a complaint that carries with it a high risk of complications and morbidity.  The differential diagnosis includes UTI/pyelonephritis, nephrolithiasis, diverticulosis/diverticulitis, colitis, bowel obstruction  Additional history obtained:   Additional history obtained from wife at bedside  Previous records obtained and reviewed in epic  ED Course  Clinical Course as of Dec 20 431  Fri Dec 20, 2019  0118 Supports kidney stone  Hgb urine dipstickMarland Kitchen): MODERATE [JM]  0118 Less likely pyelonephritis esp with clear urine  WBC: 9.5 [JM]    Clinical Course User Index [JM] Shermar Friedland, Corene Cornea, MD     Medicines ordered:  Medications  HYDROmorphone (DILAUDID) injection 0.5 mg (0.5 mg Intravenous Given 12/20/19 0037)  ondansetron (ZOFRAN) injection 4 mg (4 mg Intravenous Given 12/20/19 0037)  sodium chloride 0.9 % bolus 1,000 mL (0 mLs Intravenous Stopped 12/20/19 0122)  HYDROmorphone (DILAUDID) injection 1 mg (1 mg Intravenous Given 12/20/19 0122)  iohexol (OMNIPAQUE) 300 MG/ML  solution 100 mL (100 mLs Intravenous Contrast Given 12/20/19 0147)  ketorolac (TORADOL) 30 MG/ML injection 30 mg (30 mg Intravenous Given 12/20/19 0302)  tamsulosin (FLOMAX) capsule 0.4 mg (0.4 mg Oral Given 12/20/19 0302)    Consultations Obtained:   I consulted noone  and discussed lab and imaging findings   CRITICAL INTERVENTIONS:  . Pain meds . Nausea meds . flomax  Reevaluation:  After the interventions stated above, I reevaluated the patient and found significant improvement in symptoms.   Marland Kitchen  FINAL IMPRESSION AND PLAN Final diagnoses:  Kidney stone    . Patient found to have  nephrolithiasis on his CT scan.  Pain and nausea is controlled this time.  Will follow up with urology as needed if not improving    A medical screening exam was performed and I feel the patient has had an appropriate workup for their chief complaint at this time and likelihood of emergent condition existing is low. They have been counseled on decision, DISCHARGE, follow up and which symptoms necessitate immediate return to the emergency department. They or their family verbally stated understanding and agreement with plan and discharged in stable condition.   ____________________________________________   NEW OUTPATIENT MEDICATIONS STARTED DURING THIS VISIT:  New Prescriptions   IBUPROFEN (ADVIL) 800 MG TABLET    Take 1 tablet (800 mg total) by mouth 3 (three) times daily.   ONDANSETRON (ZOFRAN ODT) 4 MG DISINTEGRATING TABLET    4mg  ODT q4 hours prn nausea/vomit   ONDANSETRON (ZOFRAN) 4 MG TABLET    Take 1 tablet (4 mg total) by mouth every 8 (eight) hours as needed for nausea or vomiting.   OXYCODONE-ACETAMINOPHEN (PERCOCET) 5-325 MG TABLET    Take 1-2 tablets by mouth every 4 (four) hours as needed.   OXYCODONE-ACETAMINOPHEN (PERCOCET/ROXICET) 5-325 MG TABLET    Take 2 tablets by mouth every 4 (four) hours as needed for severe pain.   TAMSULOSIN (FLOMAX) 0.4 MG CAPS CAPSULE    Take 1 capsule (0.4 mg total) by mouth daily.    Note:  This note was prepared with assistance of Dragon voice recognition software. Occasional wrong-word or sound-a-like substitutions may have occurred due to the inherent limitations of voice recognition software.   Chibuike Fleek, Corene Cornea, MD 12/20/19 [phone]

## 2019-12-31 ENCOUNTER — Other Ambulatory Visit: Payer: Self-pay | Admitting: Family Medicine

## 2020-01-07 ENCOUNTER — Telehealth: Payer: Self-pay | Admitting: Neurology

## 2020-01-07 ENCOUNTER — Encounter: Payer: Self-pay | Admitting: Neurology

## 2020-01-07 ENCOUNTER — Ambulatory Visit: Payer: BC Managed Care – PPO | Admitting: Neurology

## 2020-01-07 VITALS — BP 138/82 | HR 64 | Ht 70.0 in | Wt 205.5 lb

## 2020-01-07 DIAGNOSIS — I679 Cerebrovascular disease, unspecified: Secondary | ICD-10-CM

## 2020-01-07 DIAGNOSIS — R519 Headache, unspecified: Secondary | ICD-10-CM

## 2020-01-07 DIAGNOSIS — G8929 Other chronic pain: Secondary | ICD-10-CM

## 2020-01-07 MED ORDER — RIZATRIPTAN BENZOATE 10 MG PO TBDP
ORAL_TABLET | ORAL | 11 refills | Status: DC
Start: 2020-01-07 — End: 2020-08-25

## 2020-01-07 MED ORDER — PROPRANOLOL HCL ER 80 MG PO CP24: 80.0000 mg | ORAL_CAPSULE | Freq: Every day | ORAL | 11 refills | Status: DC

## 2020-01-07 MED ORDER — LAMOTRIGINE 25 MG PO TABS: ORAL_TABLET | ORAL | 0 refills | Status: DC

## 2020-01-07 MED ORDER — LAMOTRIGINE 100 MG PO TABS: 100.0000 mg | ORAL_TABLET | Freq: Two times a day (BID) | ORAL | 11 refills | Status: DC

## 2020-01-07 NOTE — Telephone Encounter (Signed)
Please call to cancel previous order of propanolol LA, I did not realize he is on metoprolol already  Please also let patient know

## 2020-01-07 NOTE — Progress Notes (Signed)
Chief Complaint  Patient presents with  . Consult    He is here with his wife, Marti Sleigh. They would like to review his recent abnormal MRI brain scan. Reports increase in headaches (last seen here for migraines in 2016).  He has been taking topiramate for years but reports have his first kidney stone two weeks ago. Maxalt works well for acute migraine relief. States he will occasionally have either visual and/or auditory hallucinations. He realizes these are not real. They are infrequent now.     HISTORICAL  PARAG DORTON is a 59 years old male, seen in request by her primary care physician Dr. Tula Nakayama for evaluation of abnormal MRI scan, he is accompanied by his wife Marti Sleigh at today's visit on January 07, 2020.  I reviewed and summarized the referring note. HTN HLD Chronic insomnia, temazepam 30mg  qhs, was helpful  He reported a long history of migraine headaches since 2014, I saw him previously in 2016 for headache, which sometimes exacerbated to a much more severe pounding headache with associated light, noise sensitivity, responding well to Maxalt,  CT scan in the past showed no acute abnormalities.  Because of his migraine features, he was put on Topamax 100 mg twice a day as preventive medications, which helped him some, but is only taking it as needed instead of daily  He complains of worsening headache over the past couple years, multiple episodes daily, complains of vertex area headache, sometimes spread to lateralized frontal temporal region, occasionally visual and auditory hallucinations, headache continue to respond well to Maxalt,  We personally reviewed MRI of the brain on October 27, no acute abnormality, numerous small periventricular white matter small vessel disease  Laboratory evaluation in November 2029 showed normal CBC, hemoglobin of 14.6, CMP creatinine of 1.23,  REVIEW OF SYSTEMS: Full 14 system review of systems performed and notable only for as  above All other review of systems were negative.  ALLERGIES: No Known Allergies  HOME MEDICATIONS: Current Outpatient Medications  Medication Sig Dispense Refill  . albuterol (PROVENTIL HFA;VENTOLIN HFA) 108 (90 Base) MCG/ACT inhaler Inhale 2 puffs into the lungs every 6 (six) hours as needed for wheezing or shortness of breath. 18 g 5  . aspirin EC 81 MG tablet Take 81 mg by mouth at bedtime.    Marland Kitchen azelastine (ASTELIN) 0.1 % nasal spray Place 2 sprays into both nostrils 2 (two) times daily. Use in each nostril as directed 30 mL 12  . budesonide-formoterol (SYMBICORT) 160-4.5 MCG/ACT inhaler Inhale 2 puffs into the lungs 2 (two) times daily. 1 Inhaler 3  . chlorthalidone (HYGROTON) 25 MG tablet Take one-half tablet by  mouth daily 45 tablet 1  . ibuprofen (ADVIL) 800 MG tablet Take 1 tablet (800 mg total) by mouth 3 (three) times daily. 21 tablet 0  . loratadine (CLARITIN) 10 MG tablet Take 1 tablet (10 mg total) by mouth daily. 90 tablet 3  . metoprolol succinate (TOPROL-XL) 25 MG 24 hr tablet TAKE 1 TABLET BY MOUTH ONCE DAILY. 90 tablet 3  . naproxen (NAPROSYN) 375 MG tablet Take 1 tablet (375 mg total) by mouth 2 (two) times daily with a meal. 20 tablet 0  . omeprazole (PRILOSEC) 40 MG capsule TAKE (1) CAPSULE BY MOUTH ONCE DAILY. 90 capsule 0  . rizatriptan (MAXALT-MLT) 10 MG disintegrating tablet DISSOLVE 1 TABLET ON TONGUE. MAY REPEAT IN 2 HOURS IF NEEDED. 9 tablet 5  . simvastatin (ZOCOR) 10 MG tablet Take 1 tablet (10 mg total) by  mouth at bedtime. 90 tablet 1  . tamsulosin (FLOMAX) 0.4 MG CAPS capsule Take 1 capsule (0.4 mg total) by mouth daily. 30 capsule 0  . temazepam (RESTORIL) 30 MG capsule TAKE 1 CAPSULE BY MOUTH AT BEDTIME FOR SLEEP. 30 capsule 0  . terbinafine (LAMISIL) 250 MG tablet Take 1 tablet (250 mg total) by mouth daily. 42 tablet 0  . topiramate (TOPAMAX) 100 MG tablet Take 1 tablet (100 mg total) by mouth 2 (two) times daily. 60 tablet 5  . triamcinolone (KENALOG)  0.025 % ointment APPLY SPARINGLY TWICE DAILY TO AFFECTED AREA FOR 1 WEEK, THEN AS DIRECTED. 15 g 0   No current facility-administered medications for this visit.    PAST MEDICAL HISTORY: Past Medical History:  Diagnosis Date  . Alcoholic ([place]) quit 0867   10-15 beers per day on the weekends   . Anemia   . Arthritis   . Asthma    childhood asthma up to age 27  . Chest pain    Palpations  . Dyspnea   . Elevated CPK   . Hallucination   . Headache   . Hemorrhoids    with bleeding   . History of colonic polyps    removed via colonscope  . Hypertension   . Kidney stone   . Migraine   . Overweight (BMI 25.0-29.9)   . Prediabetes 10/2012   HBA1C is 6.4  . Tobacco abuse     PAST SURGICAL HISTORY: Past Surgical History:  Procedure Laterality Date  . CATARACT EXTRACTION W/ INTRAOCULAR LENS IMPLANT Bilateral 2012  . COLONOSCOPY  2007   Dr. Oneida Alar: internal hemorrhoids, otherwise normal  . COLONOSCOPY  2004   reported history of colon polyp  . COLONOSCOPY WITH PROPOFOL N/A 07/26/2016   Procedure: COLONOSCOPY WITH PROPOFOL;  Surgeon: Danie Binder, MD;  [facility];  Service: Endoscopy;  Laterality: N/A;  830   . hemmorrhoidectomy  2002  . POLYPECTOMY  07/26/2016   Procedure: POLYPECTOMY;  Surgeon: Danie Binder, MD;  [facility];  Service: Endoscopy;;  splenic flexure polyps x4    FAMILY HISTORY: Family History  Problem Relation Age of Onset  . Breast cancer Mother   . Cancer Father        unsure of origin - spread to bones  . Kidney disease Brother   . Diabetes Sister   . Breast cancer Sister   . Colon cancer Neg Hx     SOCIAL HISTORY: Social History   Socioeconomic History  . Marital status: Married    Spouse name: Marti Sleigh  . Number of children: 0  . Years of education: 42  . Highest education level: Not on file  Occupational History  . Occupation: retired Corporate treasurer  Tobacco Use  . Smoking status: Former Smoker    Quit  date: 03/16/2012    Years since quitting: 7.8  . Smokeless tobacco: Never Used  Substance and Sexual Activity  . Alcohol use: No    Alcohol/week: 0.0 standard drinks    Comment: History of alcohol use with beer on weekends, none since 2014   . Drug use: No  . Sexual activity: Yes  Other Topics Concern  . Not on file  Social History Narrative   Patient lives with wife.   Patient has no biological children. He has two stepchildren.   Patient is left handed.   Patient has a high school education.   No daily use of caffeine.   Social Determinants of  Health   Financial Resource Strain:   . Difficulty of Paying Living Expenses: Not on file  Food Insecurity:   . Worried About Charity fundraiser in the Last Year: Not on file  . Ran Out of Food in the Last Year: Not on file  Transportation Needs:   . Lack of Transportation (Medical): Not on file  . Lack of Transportation (Non-Medical): Not on file  Physical Activity:   . Days of Exercise per Week: Not on file  . Minutes of Exercise per Session: Not on file  Stress:   . Feeling of Stress : Not on file  Social Connections:   . Frequency of Communication with Friends and Family: Not on file  . Frequency of Social Gatherings with Friends and Family: Not on file  . Attends Religious Services: Not on file  . Active Member of Clubs or Organizations: Not on file  . Attends Archivist Meetings: Not on file  . Marital Status: Not on file  Intimate Partner Violence:   . Fear of Current or Ex-Partner: Not on file  . Emotionally Abused: Not on file  . Physically Abused: Not on file  . Sexually Abused: Not on file     PHYSICAL EXAM   Vitals:   01/07/20 1023  BP: 138/82  Pulse: 64  Weight: 205 lb 8 oz (93.2 kg)  Height: 5\' 10"  (1.778 m)   Not recorded     Body mass index is 29.49 kg/m.  PHYSICAL EXAMNIATION:  Gen: NAD, conversant, well nourised, well groomed                     Cardiovascular: Regular rate rhythm,  no peripheral edema, warm, nontender. Eyes: Conjunctivae clear without exudates or hemorrhage Neck: Supple, no carotid bruits. Pulmonary: Clear to auscultation bilaterally   NEUROLOGICAL EXAM:  MENTAL STATUS: Speech:    Speech is normal; fluent and spontaneous with normal comprehension.  Cognition:     Orientation to time, place and person     Normal recent and remote memory     Normal Attention span and concentration     Normal Language, naming, repeating,spontaneous speech     Fund of knowledge   CRANIAL NERVES: CN II: Visual fields are full to confrontation. Pupils are round equal and briskly reactive to light. CN III, IV, VI: extraocular movement are normal. No ptosis. CN V: Facial sensation is intact to light touch CN VII: Face is symmetric with normal eye closure  CN VIII: Hearing is normal to causal conversation. CN IX, X: Phonation is normal. CN XI: Head turning and shoulder shrug are intact  MOTOR: There is no pronator drift of out-stretched arms. Muscle bulk and tone are normal. Muscle strength is normal.  REFLEXES: Reflexes are 2+ and symmetric at the biceps, triceps, knees, and ankles. Plantar responses are flexor.  SENSORY: Intact to light touch, pinprick and vibratory sensation are intact in fingers and toes.  COORDINATION: There is no trunk or limb dysmetria noted.  GAIT/STANCE: Posture is normal. Gait is steady with normal steps, base, arm swing, and turning. Heel and toe walking are normal. Tandem gait is normal.  Romberg is absent.   DIAGNOSTIC DATA (LABS, IMAGING, TESTING) - I reviewed patient records, labs, notes, testing and imaging myself where available.   ASSESSMENT AND PLAN  ASHELY JOSHUA is a 59 y.o. male   Cerebral small vessel disease Frequent headache with migraine features  He has vascular risk factor of hypertension, aging,  Complete evaluation with echocardiogram, ultrasound of carotid artery  Increase water intake  Aspirin 81  mg daily  Lamotrigine, titrating to 100 mg twice a day as preventive medications   Marcial Pacas, M.D. Ph.D.  [facility] [address] [place], [place] [address] Ph: [phone] Fax: [phone]  CC:  Fayrene Helper, [place], [place],  [place] [address]

## 2020-01-07 NOTE — Telephone Encounter (Signed)
I spoke to the patient's wife and she is aware of this change.  I called [facility] [phone]) and spoke to pharmacist, Mitzi Hansen, who voided the propanolol LA prescription that was sent earlier.

## 2020-01-08 NOTE — Addendum Note (Signed)
Addended by: Marcial Pacas on: 01/08/2020 11:13 AM   Modules accepted: Level of Service

## 2020-01-14 ENCOUNTER — Telehealth: Payer: Self-pay | Admitting: Neurology

## 2020-01-14 NOTE — Telephone Encounter (Signed)
01/14/2020 Echo Can Be sch. Jayme Cloud NPR Spoke to [place] C  12/14/2021Doppler Can Be sch. Jayme Cloud NPR Spoke to [place]

## 2020-01-23 ENCOUNTER — Ambulatory Visit (HOSPITAL_BASED_OUTPATIENT_CLINIC_OR_DEPARTMENT_OTHER)
Admission: RE | Admit: 2020-01-23 | Discharge: 2020-01-23 | Disposition: A | Discharge status: Home / Self Care | Payer: BC Managed Care – PPO | Source: Ambulatory Visit

## 2020-01-23 ENCOUNTER — Other Ambulatory Visit: Payer: Self-pay

## 2020-01-23 ENCOUNTER — Ambulatory Visit (HOSPITAL_COMMUNITY)
Admission: RE | Admit: 2020-01-23 | Discharge: 2020-01-23 | Disposition: A | Discharge status: Home / Self Care | Payer: BC Managed Care – PPO | Source: Ambulatory Visit | Attending: Neurology | Admitting: Neurology

## 2020-01-23 DIAGNOSIS — I340 Nonrheumatic mitral (valve) insufficiency: Secondary | ICD-10-CM | POA: Diagnosis not present

## 2020-01-23 DIAGNOSIS — I679 Cerebrovascular disease, unspecified: Secondary | ICD-10-CM | POA: Diagnosis not present

## 2020-01-23 DIAGNOSIS — I371 Nonrheumatic pulmonary valve insufficiency: Secondary | ICD-10-CM

## 2020-01-23 DIAGNOSIS — I361 Nonrheumatic tricuspid (valve) insufficiency: Secondary | ICD-10-CM

## 2020-01-23 LAB — ECHOCARDIOGRAM COMPLETE
Area-P 1/2: 4.68 cm2
Calc EF: 51.4 %
S' Lateral: 3.5 cm
Single Plane A2C EF: 54.7 %
Single Plane A4C EF: 51 %

## 2020-01-23 NOTE — Progress Notes (Signed)
  Echocardiogram 2D Echocardiogram has been performed.  Darlina Sicilian M 01/23/2020, 1:40 PM

## 2020-01-23 NOTE — Progress Notes (Signed)
Carotid artery duplex completed. Refer to "CV Proc" under chart review to view preliminary results.  01/23/2020 2:23 PM Kelby Aline., MHA, RVT, RDCS, RDMS

## 2020-01-28 ENCOUNTER — Other Ambulatory Visit: Payer: Self-pay | Admitting: Family Medicine

## 2020-01-29 ENCOUNTER — Telehealth: Payer: Self-pay | Admitting: *Deleted

## 2020-01-29 NOTE — Telephone Encounter (Signed)
-----   Message from Micki Riley, MD sent at 01/29/2020  4:31 PM EST ----- Kindly inform the patient that echocardiogram study showed normal pumping function of the heart and valvular function.  No worrisome finding

## 2020-01-29 NOTE — Telephone Encounter (Signed)
I have spoken with the patient and provided him with the results. He did not have any questions.  ° °

## 2020-01-29 NOTE — Progress Notes (Signed)
Kindly inform the patient that echocardiogram study showed normal pumping function of the heart and valvular function.  No worrisome finding

## 2020-01-29 NOTE — Telephone Encounter (Signed)
-----   Message from Micki Riley, MD sent at 01/29/2020  4:50 PM EST ----- Kindly inform the patient that carotid ultrasound study did not show any major blockages of either carotid artery in the neck

## 2020-01-29 NOTE — Progress Notes (Signed)
Kindly inform the patient that carotid ultrasound study did not show any major blockages of either carotid artery in the neck

## 2020-01-29 NOTE — Telephone Encounter (Signed)
I have spoken with the patient and provided him with the results. He did not have any questions.

## 2020-02-03 ENCOUNTER — Other Ambulatory Visit: Payer: Self-pay | Admitting: Family Medicine

## 2020-02-03 MED ORDER — TEMAZEPAM 30 MG PO CAPS: 30.0000 mg | ORAL_CAPSULE | Freq: Every evening | ORAL | 2 refills | Status: DC | PRN

## 2020-02-14 DIAGNOSIS — L309 Dermatitis, unspecified: Secondary | ICD-10-CM | POA: Diagnosis not present

## 2020-02-14 DIAGNOSIS — L011 Impetiginization of other dermatoses: Secondary | ICD-10-CM | POA: Diagnosis not present

## 2020-02-14 DIAGNOSIS — L0889 Other specified local infections of the skin and subcutaneous tissue: Secondary | ICD-10-CM | POA: Diagnosis not present

## 2020-04-02 NOTE — Progress Notes (Deleted)
No chief complaint on file.   HISTORICAL  Bryan Rodgers is a 60 years old male, seen in request by her primary care physician Dr. Tula Nakayama for evaluation of abnormal MRI scan, he is accompanied by his wife Marti Sleigh at today's visit on January 07, 2020.  I reviewed and summarized the referring note. HTN HLD Chronic insomnia, temazepam 30mg  qhs, was helpful  He reported a long history of migraine headaches since 2014, I saw him previously in 2016 for headache, which sometimes exacerbated to a much more severe pounding headache with associated light, noise sensitivity, responding well to Maxalt,  CT scan in the past showed no acute abnormalities.  Because of his migraine features, he was put on Topamax 100 mg twice a day as preventive medications, which helped him some, but is only taking it as needed instead of daily  He complains of worsening headache over the past couple years, multiple episodes daily, complains of vertex area headache, sometimes spread to lateralized frontal temporal region, occasionally visual and auditory hallucinations, headache continue to respond well to Maxalt,  We personally reviewed MRI of the brain on October 27, no acute abnormality, numerous small periventricular white matter small vessel disease  Laboratory evaluation in November 2029 showed normal CBC, hemoglobin of 14.6, CMP creatinine of 1.23,  Update April 06, 2020 SS:   Echocardiogram was unremarkable.  Carotid ultrasound was unremarkable.  REVIEW OF SYSTEMS: Full 14 system review of systems performed and notable only for as above All other review of systems were negative.  ALLERGIES: No Known Allergies  HOME MEDICATIONS: Current Outpatient Medications  Medication Sig Dispense Refill  . albuterol (PROVENTIL HFA;VENTOLIN HFA) 108 (90 Base) MCG/ACT inhaler Inhale 2 puffs into the lungs every 6 (six) hours as needed for wheezing or shortness of breath. 18 g 5  . aspirin EC 81 MG  tablet Take 81 mg by mouth at bedtime.    Marland Kitchen azelastine (ASTELIN) 0.1 % nasal spray Place 2 sprays into both nostrils 2 (two) times daily. Use in each nostril as directed 30 mL 12  . budesonide-formoterol (SYMBICORT) 160-4.5 MCG/ACT inhaler Inhale 2 puffs into the lungs 2 (two) times daily. 1 Inhaler 3  . chlorthalidone (HYGROTON) 25 MG tablet Take one-half tablet by  mouth daily 45 tablet 1  . ibuprofen (ADVIL) 800 MG tablet Take 1 tablet (800 mg total) by mouth 3 (three) times daily. 21 tablet 0  . lamoTRIgine (LAMICTAL) 100 MG tablet Take 1 tablet (100 mg total) by mouth 2 (two) times daily. 60 tablet 11  . lamoTRIgine (LAMICTAL) 25 MG tablet 1 tab bid x one week 2 tab bid x 2nd week 3 tab bid x 3rd week 84 tablet 0  . loratadine (CLARITIN) 10 MG tablet Take 1 tablet (10 mg total) by mouth daily. 90 tablet 3  . metoprolol succinate (TOPROL-XL) 25 MG 24 hr tablet TAKE 1 TABLET BY MOUTH ONCE DAILY. 90 tablet 3  . naproxen (NAPROSYN) 375 MG tablet Take 1 tablet (375 mg total) by mouth 2 (two) times daily with a meal. 20 tablet 0  . omeprazole (PRILOSEC) 40 MG capsule TAKE (1) CAPSULE BY MOUTH ONCE DAILY. 90 capsule 0  . rizatriptan (MAXALT-MLT) 10 MG disintegrating tablet DISSOLVE 1 TABLET ON TONGUE. MAY REPEAT IN 2 HOURS IF NEEDED. 9 tablet 11  . simvastatin (ZOCOR) 10 MG tablet Take 1 tablet (10 mg total) by mouth at bedtime. 90 tablet 1  . tamsulosin (FLOMAX) 0.4 MG CAPS capsule Take 1 capsule (  0.4 mg total) by mouth daily. 30 capsule 0  . temazepam (RESTORIL) 30 MG capsule Take 1 capsule (30 mg total) by mouth at bedtime as needed for sleep. 30 capsule 2  . terbinafine (LAMISIL) 250 MG tablet Take 1 tablet (250 mg total) by mouth daily. 42 tablet 0  . triamcinolone (KENALOG) 0.025 % ointment APPLY SPARINGLY TWICE DAILY TO AFFECTED AREA FOR 1 WEEK, THEN AS DIRECTED. 15 g 0   No current facility-administered medications for this visit.    PAST MEDICAL HISTORY: Past Medical History:   Diagnosis Date  . Alcoholic ([place]) quit 4174   10-15 beers per day on the weekends   . Anemia   . Arthritis   . Asthma    childhood asthma up to age 80  . Chest pain    Palpations  . Dyspnea   . Elevated CPK   . Hallucination   . Headache   . Hemorrhoids    with bleeding   . History of colonic polyps    removed via colonscope  . Hypertension   . Kidney stone   . Migraine   . Overweight (BMI 25.0-29.9)   . Prediabetes 10/2012   HBA1C is 6.4  . Tobacco abuse     PAST SURGICAL HISTORY: Past Surgical History:  Procedure Laterality Date  . CATARACT EXTRACTION W/ INTRAOCULAR LENS IMPLANT Bilateral 2012  . COLONOSCOPY  2007   Dr. Oneida Alar: internal hemorrhoids, otherwise normal  . COLONOSCOPY  2004   reported history of colon polyp  . COLONOSCOPY WITH PROPOFOL N/A 07/26/2016   Procedure: COLONOSCOPY WITH PROPOFOL;  Surgeon: Danie Binder, MD;  [facility];  Service: Endoscopy;  Laterality: N/A;  830   . hemmorrhoidectomy  2002  . POLYPECTOMY  07/26/2016   Procedure: POLYPECTOMY;  Surgeon: Danie Binder, MD;  Location: [facility];  Service: Endoscopy;;  splenic flexure polyps x4    FAMILY HISTORY: Family History  Problem Relation Age of Onset  . Breast cancer Mother   . Cancer Father        unsure of origin - spread to bones  . Kidney disease Brother   . Diabetes Sister   . Breast cancer Sister   . Colon cancer Neg Hx     SOCIAL HISTORY: Social History   Socioeconomic History  . Marital status: Married    Spouse name: Marti Sleigh  . Number of children: 0  . Years of education: 54  . Highest education level: Not on file  Occupational History  . Occupation: retired Corporate treasurer  Tobacco Use  . Smoking status: Former Smoker    Quit date: 03/16/2012    Years since quitting: 8.0  . Smokeless tobacco: Never Used  Substance and Sexual Activity  . Alcohol use: No    Alcohol/week: 0.0 standard drinks    Comment: History of alcohol use with beer  on weekends, none since 2014   . Drug use: No  . Sexual activity: Yes  Other Topics Concern  . Not on file  Social History Narrative   Patient lives with wife.   Patient has no biological children. He has two stepchildren.   Patient is left handed.   Patient has a high school education.   No daily use of caffeine.   Social Determinants of Health   Financial Resource Strain: Not on file  Food Insecurity: Not on file  Transportation Needs: Not on file  Physical Activity: Not on file  Stress: Not on file  Social  Connections: Not on file  Intimate Partner Violence: Not on file     PHYSICAL EXAM   There were no vitals filed for this visit. Not recorded     There is no height or weight on file to calculate BMI.  PHYSICAL EXAMNIATION:  Gen: NAD, conversant, well nourised, well groomed                     Cardiovascular: Regular rate rhythm, no peripheral edema, warm, nontender. Eyes: Conjunctivae clear without exudates or hemorrhage Neck: Supple, no carotid bruits. Pulmonary: Clear to auscultation bilaterally   NEUROLOGICAL EXAM:  MENTAL STATUS: Speech:    Speech is normal; fluent and spontaneous with normal comprehension.  Cognition:     Orientation to time, place and person     Normal recent and remote memory     Normal Attention span and concentration     Normal Language, naming, repeating,spontaneous speech     Fund of knowledge   CRANIAL NERVES: CN II: Visual fields are full to confrontation. Pupils are round equal and briskly reactive to light. CN III, IV, VI: extraocular movement are normal. No ptosis. CN V: Facial sensation is intact to light touch CN VII: Face is symmetric with normal eye closure  CN VIII: Hearing is normal to causal conversation. CN IX, X: Phonation is normal. CN XI: Head turning and shoulder shrug are intact  MOTOR: There is no pronator drift of out-stretched arms. Muscle bulk and tone are normal. Muscle strength is  normal.  REFLEXES: Reflexes are 2+ and symmetric at the biceps, triceps, knees, and ankles. Plantar responses are flexor.  SENSORY: Intact to light touch, pinprick and vibratory sensation are intact in fingers and toes.  COORDINATION: There is no trunk or limb dysmetria noted.  GAIT/STANCE: Posture is normal. Gait is steady with normal steps, base, arm swing, and turning. Heel and toe walking are normal. Tandem gait is normal.  Romberg is absent.   DIAGNOSTIC DATA (LABS, IMAGING, TESTING) - I reviewed patient records, labs, notes, testing and imaging myself where available.   ASSESSMENT AND PLAN  BAER HINTON is a 60 y.o. male   1. Cerebral small vessel disease 2. Frequent headache with migraine features Vascular risk factor of hypertension, aging, Echocardiogram was unremarkable Carotid ultrasound was unremarkable  Increase water intake Continue aspirin 81 mg daily Continue lamotrigine 100 mg twice a day as preventative medication   Marcial Pacas, M.D. Ph.D.  [facility] [address] [place], [place] [address] Ph: [phone] Fax: [phone]  CC:  Fayrene Helper, [place], [place],  [place] [id number]

## 2020-04-06 ENCOUNTER — Ambulatory Visit: Payer: BC Managed Care – PPO | Admitting: Neurology

## 2020-04-07 ENCOUNTER — Telehealth: Payer: Self-pay

## 2020-04-07 ENCOUNTER — Other Ambulatory Visit: Payer: Self-pay | Admitting: Family Medicine

## 2020-04-07 MED ORDER — TEMAZEPAM 30 MG PO CAPS: 30.0000 mg | ORAL_CAPSULE | Freq: Every evening | ORAL | 0 refills | Status: DC | PRN

## 2020-04-07 NOTE — Telephone Encounter (Signed)
Requests temazepam refill

## 2020-04-07 NOTE — Telephone Encounter (Signed)
Pls  advise 1 month is prescribed, needs fasting lipid, cmp and  EGFR and HBA1C in 3 to 5 days before a visit which he needs in the next 1 month

## 2020-04-10 ENCOUNTER — Other Ambulatory Visit: Payer: Self-pay

## 2020-04-10 DIAGNOSIS — R7301 Impaired fasting glucose: Secondary | ICD-10-CM

## 2020-04-10 DIAGNOSIS — E1169 Type 2 diabetes mellitus with other specified complication: Secondary | ICD-10-CM

## 2020-04-10 DIAGNOSIS — I10 Essential (primary) hypertension: Secondary | ICD-10-CM

## 2020-04-10 NOTE — Telephone Encounter (Signed)
FYI-  Wife aware and states he will call back to schedule

## 2020-04-13 ENCOUNTER — Other Ambulatory Visit: Payer: Self-pay

## 2020-04-13 ENCOUNTER — Encounter: Payer: Self-pay | Admitting: Nurse Practitioner

## 2020-04-13 ENCOUNTER — Ambulatory Visit (INDEPENDENT_AMBULATORY_CARE_PROVIDER_SITE_OTHER): Payer: BC Managed Care – PPO | Admitting: Nurse Practitioner

## 2020-04-13 DIAGNOSIS — I10 Essential (primary) hypertension: Secondary | ICD-10-CM | POA: Diagnosis not present

## 2020-04-13 NOTE — Assessment & Plan Note (Signed)
-  he demonstrated the use of his wrist cuff, and his wrist was lower than the level of his heart -we discussed anatomical landmarks to improve his BP cuff accuracy, and he demonstrated proper technique, and his readings improved from SBP in the 160s to the 140s -his cuff is still not reading the same as our manual cuff, so we discussed getting an arm cuff instead of wrist cuff, or using optimal technique to improve home BP accuracy

## 2020-04-13 NOTE — Progress Notes (Signed)
Acute Office Visit  Subjective:    Patient ID: Bryan Rodgers, male    DOB: [birth year], 60 y.o.   MRN: [id number]  Chief Complaint  Patient presents with  . Hypertension    HPI Patient is in today for BP check. He states his BP has been elevated at home with SBP readings in the 160-170s. He has been using a wrist cuff at home.  Past Medical History:  Diagnosis Date  . Alcoholic ([place]) quit 7564   10-15 beers per day on the weekends   . Anemia   . Arthritis   . Asthma    childhood asthma up to age 45  . Chest pain    Palpations  . Dyspnea   . Elevated CPK   . Hallucination   . Headache   . Hemorrhoids    with bleeding   . History of colonic polyps    removed via colonscope  . Hypertension   . Kidney stone   . Migraine   . Overweight (BMI 25.0-29.9)   . Prediabetes 10/2012   HBA1C is 6.4  . Tobacco abuse     Past Surgical History:  Procedure Laterality Date  . CATARACT EXTRACTION W/ INTRAOCULAR LENS IMPLANT Bilateral 2012  . COLONOSCOPY  2007   Dr. Oneida Alar: internal hemorrhoids, otherwise normal  . COLONOSCOPY  2004   reported history of colon polyp  . COLONOSCOPY WITH PROPOFOL N/A 07/26/2016   Procedure: COLONOSCOPY WITH PROPOFOL;  Surgeon: Danie Binder, MD;  [facility];  Service: Endoscopy;  Laterality: N/A;  830   . hemmorrhoidectomy  2002  . POLYPECTOMY  07/26/2016   Procedure: POLYPECTOMY;  Surgeon: Danie Binder, MD;  [facility];  Service: Endoscopy;;  splenic flexure polyps x4    Family History  Problem Relation Age of Onset  . Breast cancer Mother   . Cancer Father        unsure of origin - spread to bones  . Kidney disease Brother   . Diabetes Sister   . Breast cancer Sister   . Colon cancer Neg Hx     Social History   Socioeconomic History  . Marital status: Married    Spouse name: Marti Sleigh  . Number of children: 0  . Years of education: 54  . Highest education level: Not on file  Occupational History   . Occupation: retired Corporate treasurer  Tobacco Use  . Smoking status: Former Smoker    Quit date: 03/16/2012    Years since quitting: 8.0  . Smokeless tobacco: Never Used  Substance and Sexual Activity  . Alcohol use: No    Alcohol/week: 0.0 standard drinks    Comment: History of alcohol use with beer on weekends, none since 2014   . Drug use: No  . Sexual activity: Yes  Other Topics Concern  . Not on file  Social History Narrative   Patient lives with wife.   Patient has no biological children. He has two stepchildren.   Patient is left handed.   Patient has a high school education.   No daily use of caffeine.   Social Determinants of Health   Financial Resource Strain: Not on file  Food Insecurity: Not on file  Transportation Needs: Not on file  Physical Activity: Not on file  Stress: Not on file  Social Connections: Not on file  Intimate Partner Violence: Not on file    Outpatient Medications Prior to Visit  Medication Sig Dispense Refill  .  albuterol (PROVENTIL HFA;VENTOLIN HFA) 108 (90 Base) MCG/ACT inhaler Inhale 2 puffs into the lungs every 6 (six) hours as needed for wheezing or shortness of breath. 18 g 5  . aspirin EC 81 MG tablet Take 81 mg by mouth at bedtime.    Marland Kitchen azelastine (ASTELIN) 0.1 % nasal spray Place 2 sprays into both nostrils 2 (two) times daily. Use in each nostril as directed 30 mL 12  . budesonide-formoterol (SYMBICORT) 160-4.5 MCG/ACT inhaler Inhale 2 puffs into the lungs 2 (two) times daily. 1 Inhaler 3  . chlorthalidone (HYGROTON) 25 MG tablet Take one-half tablet by  mouth daily 45 tablet 1  . ibuprofen (ADVIL) 800 MG tablet Take 1 tablet (800 mg total) by mouth 3 (three) times daily. 21 tablet 0  . lamoTRIgine (LAMICTAL) 100 MG tablet Take 1 tablet (100 mg total) by mouth 2 (two) times daily. 60 tablet 11  . lamoTRIgine (LAMICTAL) 25 MG tablet 1 tab bid x one week 2 tab bid x 2nd week 3 tab bid x 3rd week 84 tablet 0  . loratadine  (CLARITIN) 10 MG tablet Take 1 tablet (10 mg total) by mouth daily. 90 tablet 3  . metoprolol succinate (TOPROL-XL) 25 MG 24 hr tablet TAKE 1 TABLET BY MOUTH ONCE DAILY. 90 tablet 3  . naproxen (NAPROSYN) 375 MG tablet Take 1 tablet (375 mg total) by mouth 2 (two) times daily with a meal. 20 tablet 0  . omeprazole (PRILOSEC) 40 MG capsule TAKE (1) CAPSULE BY MOUTH ONCE DAILY. 90 capsule 0  . rizatriptan (MAXALT-MLT) 10 MG disintegrating tablet DISSOLVE 1 TABLET ON TONGUE. MAY REPEAT IN 2 HOURS IF NEEDED. 9 tablet 11  . simvastatin (ZOCOR) 10 MG tablet Take 1 tablet (10 mg total) by mouth at bedtime. 90 tablet 1  . tamsulosin (FLOMAX) 0.4 MG CAPS capsule Take 1 capsule (0.4 mg total) by mouth daily. 30 capsule 0  . temazepam (RESTORIL) 30 MG capsule Take 1 capsule (30 mg total) by mouth at bedtime as needed for sleep. 30 capsule 0  . terbinafine (LAMISIL) 250 MG tablet Take 1 tablet (250 mg total) by mouth daily. 42 tablet 0  . triamcinolone (KENALOG) 0.025 % ointment APPLY SPARINGLY TWICE DAILY TO AFFECTED AREA FOR 1 WEEK, THEN AS DIRECTED. 15 g 0   No facility-administered medications prior to visit.    No Known Allergies  Review of Systems  Constitutional: Negative.   Respiratory: Negative.   Cardiovascular:       BP elevated on home readings       Objective:    Physical Exam Constitutional:      Appearance: Normal appearance.  Cardiovascular:     Rate and Rhythm: Normal rate and regular rhythm.     Pulses: Normal pulses.     Heart sounds: Normal heart sounds.  Pulmonary:     Effort: Pulmonary effort is normal.     Breath sounds: Normal breath sounds.  Neurological:     Mental Status: He is alert.     BP 123/67   Pulse 78   Temp 97.6 F (36.4 C)   Resp 20   Ht 5\' 10"  (1.778 m)   Wt 216 lb (98 kg)   SpO2 92%   BMI 30.99 kg/m  Wt Readings from Last 3 Encounters:  04/13/20 216 lb (98 kg)  01/07/20 205 lb 8 oz (93.2 kg)  12/19/19 189 lb (85.7 kg)    Health  Maintenance Due  Topic Date Due  . COVID-19 Vaccine (  1) Never done  . HEMOGLOBIN A1C  01/02/2020    There are no preventive care reminders to display for this patient.   Lab Results  Component Value Date   TSH 1.01 08/07/2018   Lab Results  Component Value Date   WBC 9.5 12/20/2019   HGB 14.6 12/20/2019   HCT 45.8 12/20/2019   MCV 94.2 12/20/2019   PLT 180 12/20/2019   Lab Results  Component Value Date   NA 140 12/20/2019   K 3.5 12/20/2019   CO2 20 (L) 12/20/2019   GLUCOSE 99 12/20/2019   BUN 20 12/20/2019   CREATININE 1.23 12/20/2019   BILITOT 0.4 12/20/2019   ALKPHOS 123 12/20/2019   AST 23 12/20/2019   ALT 24 12/20/2019   PROT 7.6 12/20/2019   ALBUMIN 4.4 12/20/2019   CALCIUM 9.0 12/20/2019   ANIONGAP 7 12/20/2019   Lab Results  Component Value Date   CHOL 101 01/02/2019   Lab Results  Component Value Date   HDL 37 (L) 01/02/2019   Lab Results  Component Value Date   LDLCALC 48 01/02/2019   Lab Results  Component Value Date   TRIG 78 01/02/2019   Lab Results  Component Value Date   CHOLHDL 2.7 01/02/2019   Lab Results  Component Value Date   HGBA1C 5.9 (H) 07/03/2019       Assessment & Plan:   Problem List Items Addressed This Visit      Cardiovascular and Mediastinum   HTN (hypertension)    -he demonstrated the use of his wrist cuff, and his wrist was lower than the level of his heart -we discussed anatomical landmarks to improve his BP cuff accuracy, and he demonstrated proper technique, and his readings improved from SBP in the 160s to the 140s -his cuff is still not reading the same as our manual cuff, so we discussed getting an arm cuff instead of wrist cuff, or using optimal technique to improve home BP accuracy          No orders of the defined types were placed in this encounter.    Noreene Larsson, NP

## 2020-04-24 ENCOUNTER — Other Ambulatory Visit: Payer: Self-pay | Admitting: Internal Medicine

## 2020-04-24 ENCOUNTER — Telehealth: Payer: Self-pay

## 2020-04-24 ENCOUNTER — Other Ambulatory Visit: Payer: Self-pay | Admitting: Family Medicine

## 2020-04-24 DIAGNOSIS — M1990 Unspecified osteoarthritis, unspecified site: Secondary | ICD-10-CM

## 2020-04-24 DIAGNOSIS — R7301 Impaired fasting glucose: Secondary | ICD-10-CM | POA: Diagnosis not present

## 2020-04-24 DIAGNOSIS — I10 Essential (primary) hypertension: Secondary | ICD-10-CM | POA: Diagnosis not present

## 2020-04-24 DIAGNOSIS — E1169 Type 2 diabetes mellitus with other specified complication: Secondary | ICD-10-CM | POA: Diagnosis not present

## 2020-04-24 DIAGNOSIS — E785 Hyperlipidemia, unspecified: Secondary | ICD-10-CM | POA: Diagnosis not present

## 2020-04-24 MED ORDER — TRAMADOL HCL 50 MG PO TABS: 50.0000 mg | ORAL_TABLET | Freq: Every day | ORAL | 0 refills | Status: DC

## 2020-04-24 MED ORDER — TRAMADOL HCL 50 MG PO TABS
50.0000 mg | ORAL_TABLET | Freq: Every day | ORAL | 0 refills | Status: DC
Start: 2020-04-24 — End: 2020-06-16

## 2020-04-24 NOTE — Telephone Encounter (Signed)
Sent!

## 2020-04-24 NOTE — Telephone Encounter (Signed)
Requesting a refill of tramadol

## 2020-04-25 LAB — LIPID PANEL
Chol/HDL Ratio: 3.7 ratio (ref 0.0–5.0)
Cholesterol, Total: 129 mg/dL (ref 100–199)
HDL: 35 mg/dL — ABNORMAL LOW (ref >39)
LDL Chol Calc (NIH): 69 mg/dL (ref 0–99)
Triglycerides: 141 mg/dL (ref 0–149)
VLDL Cholesterol Cal: 25 mg/dL (ref 5–40)

## 2020-04-25 LAB — CMP14+EGFR
ALT: 24 IU/L (ref 0–44)
AST: 31 IU/L (ref 0–40)
Albumin/Globulin Ratio: 1.8 (ref 1.2–2.2)
Albumin: 4.6 g/dL (ref 3.8–4.9)
Alkaline Phosphatase: 109 IU/L (ref 44–121)
BUN/Creatinine Ratio: 12 (ref 10–24)
BUN: 11 mg/dL (ref 8–27)
Bilirubin Total: 0.6 mg/dL (ref 0.0–1.2)
CO2: 17 mmol/L — ABNORMAL LOW (ref 20–29)
Calcium: 9.7 mg/dL (ref 8.6–10.2)
Chloride: 104 mmol/L (ref 96–106)
Creatinine, Ser: 0.89 mg/dL (ref 0.76–1.27)
Globulin, Total: 2.6 g/dL (ref 1.5–4.5)
Glucose: 90 mg/dL (ref 65–99)
Potassium: 4.1 mmol/L (ref 3.5–5.2)
Sodium: 142 mmol/L (ref 134–144)
Total Protein: 7.2 g/dL (ref 6.0–8.5)
eGFR: 98 mL/min/{1.73_m2} (ref >59)

## 2020-04-25 LAB — HEMOGLOBIN A1C
Est. average glucose Bld gHb Est-mCnc: 134 mg/dL
Hgb A1c MFr Bld: 6.3 % — ABNORMAL HIGH (ref 4.8–5.6)

## 2020-05-18 ENCOUNTER — Other Ambulatory Visit: Payer: Self-pay | Admitting: Family Medicine

## 2020-06-09 ENCOUNTER — Telehealth: Payer: Self-pay

## 2020-06-09 NOTE — Telephone Encounter (Signed)
Patient calling he is wanting to go for a ultrasound of his Right leg to rule out a blood clot ph# [phone]

## 2020-06-09 NOTE — Telephone Encounter (Signed)
Spoke with pt. He has had a knot on R leg x6 weeks. Appt made.

## 2020-06-11 ENCOUNTER — Encounter: Payer: Self-pay | Admitting: Orthopaedic Surgery

## 2020-06-11 ENCOUNTER — Other Ambulatory Visit: Payer: Self-pay

## 2020-06-11 ENCOUNTER — Other Ambulatory Visit: Payer: Self-pay | Admitting: Nurse Practitioner

## 2020-06-11 ENCOUNTER — Encounter: Payer: Self-pay | Admitting: Internal Medicine

## 2020-06-11 ENCOUNTER — Other Ambulatory Visit: Payer: Self-pay | Admitting: Family Medicine

## 2020-06-11 ENCOUNTER — Ambulatory Visit (INDEPENDENT_AMBULATORY_CARE_PROVIDER_SITE_OTHER): Payer: BC Managed Care – PPO | Admitting: Internal Medicine

## 2020-06-11 ENCOUNTER — Ambulatory Visit (HOSPITAL_COMMUNITY)
Admission: RE | Admit: 2020-06-11 | Discharge: 2020-06-11 | Disposition: A | Discharge status: Home / Self Care | Payer: BC Managed Care – PPO | Source: Ambulatory Visit | Attending: Internal Medicine | Admitting: Internal Medicine

## 2020-06-11 VITALS — BP 152/82 | HR 70 | Resp 16 | Ht 70.0 in | Wt 207.4 lb

## 2020-06-11 DIAGNOSIS — M5416 Radiculopathy, lumbar region: Secondary | ICD-10-CM | POA: Insufficient documentation

## 2020-06-11 DIAGNOSIS — Z0001 Encounter for general adult medical examination with abnormal findings: Secondary | ICD-10-CM | POA: Diagnosis not present

## 2020-06-11 DIAGNOSIS — F5101 Primary insomnia: Secondary | ICD-10-CM

## 2020-06-11 DIAGNOSIS — M545 Low back pain, unspecified: Secondary | ICD-10-CM | POA: Diagnosis not present

## 2020-06-11 DIAGNOSIS — Z0100 Encounter for examination of eyes and vision without abnormal findings: Secondary | ICD-10-CM

## 2020-06-11 DIAGNOSIS — E119 Type 2 diabetes mellitus without complications: Secondary | ICD-10-CM

## 2020-06-11 MED ORDER — GABAPENTIN 100 MG PO CAPS: 100.0000 mg | ORAL_CAPSULE | Freq: Two times a day (BID) | ORAL | 1 refills | Status: DC

## 2020-06-11 MED ORDER — TEMAZEPAM 30 MG PO CAPS: 30.0000 mg | ORAL_CAPSULE | Freq: Every evening | ORAL | 2 refills | Status: DC | PRN

## 2020-06-11 MED ORDER — PREDNISONE 20 MG PO TABS: 40.0000 mg | ORAL_TABLET | Freq: Every day | ORAL | 0 refills | Status: DC

## 2020-06-11 NOTE — Progress Notes (Signed)
Acute Office Visit  Subjective:    Patient ID: Bryan Rodgers, male    DOB: 05-24-1960, 60 y.o.   MRN: [id number]  Chief Complaint  Patient presents with  . Leg Pain    Pt having pain in right leg has been going on for about 6 weeks but slowly getting worse. Started in sciatic nerve but did not run all the way down leg just jumped to the bottom of leg     HPI Patient is in today for evaluation of right LE pain. He reports chronic low back pain, which is constant, dull, radiating to right buttock. Denies any recent injury or heavy lifting. He has been having difficulty walking due to severe pain in the back and RLE. He has been taking Tramadol for minimal relief. Denies any saddle anesthesia, urinary or stool incontinence. He also reports feeling a knot in her left leg over lateral side. Denies any warmth or redness in the area.  Past Medical History:  Diagnosis Date  . Alcoholic ([place]) quit 1423   10-15 beers per day on the weekends   . Anemia   . Arthritis   . Asthma    childhood asthma up to age 23  . Chest pain    Palpations  . Dyspnea   . Elevated CPK   . Hallucination   . Headache   . Hemorrhoids    with bleeding   . History of colonic polyps    removed via colonscope  . Hypertension   . Kidney stone   . Migraine   . Overweight (BMI 25.0-29.9)   . Prediabetes 10/2012   HBA1C is 6.4  . Tobacco abuse     Past Surgical History:  Procedure Laterality Date  . CATARACT EXTRACTION W/ INTRAOCULAR LENS IMPLANT Bilateral 2012  . COLONOSCOPY  2007   Dr. Oneida Alar: internal hemorrhoids, otherwise normal  . COLONOSCOPY  2004   reported history of colon polyp  . COLONOSCOPY WITH PROPOFOL N/A 07/26/2016   Procedure: COLONOSCOPY WITH PROPOFOL;  Surgeon: Danie Binder, MD;  [facility];  Service: Endoscopy;  Laterality: N/A;  830   . hemmorrhoidectomy  2002  . POLYPECTOMY  07/26/2016   Procedure: POLYPECTOMY;  Surgeon: Danie Binder, MD;  Location[facility];  Service: Endoscopy;;  splenic flexure polyps x4    Family History  Problem Relation Age of Onset  . Breast cancer Mother   . Cancer Father        unsure of origin - spread to bones  . Kidney disease Brother   . Diabetes Sister   . Breast cancer Sister   . Colon cancer Neg Hx     Social History   Socioeconomic History  . Marital status: Married    Spouse name: Marti Sleigh  . Number of children: 0  . Years of education: 24  . Highest education level: Not on file  Occupational History  . Occupation: retired Corporate treasurer  Tobacco Use  . Smoking status: Former Smoker    Quit date: 03/16/2012    Years since quitting: 8.2  . Smokeless tobacco: Never Used  Substance and Sexual Activity  . Alcohol use: No    Alcohol/week: 0.0 standard drinks    Comment: History of alcohol use with beer on weekends, none since 2014   . Drug use: No  . Sexual activity: Yes  Other Topics Concern  . Not on file  Social History Narrative   Patient lives with wife.  Patient has no biological children. He has two stepchildren.   Patient is left handed.   Patient has a high school education.   No daily use of caffeine.   Social Determinants of Health   Financial Resource Strain: Not on file  Food Insecurity: Not on file  Transportation Needs: Not on file  Physical Activity: Not on file  Stress: Not on file  Social Connections: Not on file  Intimate Partner Violence: Not on file    Outpatient Medications Prior to Visit  Medication Sig Dispense Refill  . albuterol (PROVENTIL HFA;VENTOLIN HFA) 108 (90 Base) MCG/ACT inhaler Inhale 2 puffs into the lungs every 6 (six) hours as needed for wheezing or shortness of breath. 18 g 5  . aspirin EC 81 MG tablet Take 81 mg by mouth at bedtime.    Marland Kitchen azelastine (ASTELIN) 0.1 % nasal spray Place 2 sprays into both nostrils 2 (two) times daily. Use in each nostril as directed 30 mL 12  . budesonide-formoterol (SYMBICORT) 160-4.5 MCG/ACT inhaler  Inhale 2 puffs into the lungs 2 (two) times daily. 1 Inhaler 3  . chlorthalidone (HYGROTON) 25 MG tablet Take one-half tablet by  mouth daily 45 tablet 1  . ibuprofen (ADVIL) 800 MG tablet Take 1 tablet (800 mg total) by mouth 3 (three) times daily. 21 tablet 0  . lamoTRIgine (LAMICTAL) 100 MG tablet Take 1 tablet (100 mg total) by mouth 2 (two) times daily. 60 tablet 11  . lamoTRIgine (LAMICTAL) 25 MG tablet 1 tab bid x one week 2 tab bid x 2nd week 3 tab bid x 3rd week 84 tablet 0  . loratadine (CLARITIN) 10 MG tablet Take 1 tablet (10 mg total) by mouth daily. 90 tablet 3  . metoprolol succinate (TOPROL-XL) 25 MG 24 hr tablet TAKE 1 TABLET BY MOUTH ONCE DAILY. 90 tablet 3  . naproxen (NAPROSYN) 375 MG tablet Take 1 tablet (375 mg total) by mouth 2 (two) times daily with a meal. 20 tablet 0  . omeprazole (PRILOSEC) 40 MG capsule TAKE (1) CAPSULE BY MOUTH ONCE DAILY. 90 capsule 0  . rizatriptan (MAXALT-MLT) 10 MG disintegrating tablet DISSOLVE 1 TABLET ON TONGUE. MAY REPEAT IN 2 HOURS IF NEEDED. 9 tablet 11  . simvastatin (ZOCOR) 10 MG tablet Take 1 tablet (10 mg total) by mouth at bedtime. 90 tablet 1  . tamsulosin (FLOMAX) 0.4 MG CAPS capsule Take 1 capsule (0.4 mg total) by mouth daily. 30 capsule 0  . terbinafine (LAMISIL) 250 MG tablet Take 1 tablet (250 mg total) by mouth daily. 42 tablet 0  . traMADol (ULTRAM) 50 MG tablet Take 1 tablet (50 mg total) by mouth at bedtime. 30 tablet 0  . triamcinolone (KENALOG) 0.025 % ointment APPLY SPARINGLY TWICE DAILY TO AFFECTED AREA FOR 1 WEEK, THEN AS DIRECTED. 15 g 0  . temazepam (RESTORIL) 30 MG capsule Take 1 capsule (30 mg total) by mouth at bedtime as needed for sleep. 30 capsule 0   No facility-administered medications prior to visit.    No Known Allergies  Review of Systems  Constitutional: Positive for fatigue. Negative for chills and fever.  HENT: Negative for congestion and sore throat.   Eyes: Negative for pain and discharge.   Respiratory: Negative for cough and shortness of breath.   Cardiovascular: Negative for chest pain and palpitations.  Endocrine: Negative for polydipsia and polyuria.  Genitourinary: Negative for dysuria and hematuria.  Musculoskeletal: Positive for arthralgias, back pain and gait problem. Negative for neck pain and  neck stiffness.  Skin: Negative for rash.  Neurological: Negative for dizziness, numbness and headaches.  Psychiatric/Behavioral: Positive for sleep disturbance. Negative for agitation and behavioral problems.       Objective:    Physical Exam Vitals reviewed.  Constitutional:      General: He is not in acute distress.    Appearance: He is not diaphoretic.  HENT:     Head: Normocephalic and atraumatic.  Eyes:     General: No scleral icterus.    Extraocular Movements: Extraocular movements intact.  Cardiovascular:     Rate and Rhythm: Normal rate and regular rhythm.     Pulses: Normal pulses.     Heart sounds: Normal heart sounds. No murmur heard.   Pulmonary:     Breath sounds: Normal breath sounds. No wheezing or rales.  Musculoskeletal:        General: Tenderness (Lumbar spine area) present.     Cervical back: Neck supple. No tenderness.     Right lower leg: No edema.     Left lower leg: No edema.     Comments: ROM at lumbar spine and hip limited due to pain  Skin:    General: Skin is warm.     Findings: No rash.  Neurological:     General: No focal deficit present.     Mental Status: He is alert and oriented to person, place, and time.     Gait: Gait abnormal.  Psychiatric:        Mood and Affect: Mood normal.        Behavior: Behavior normal.     BP (!) 152/82 (BP Location: Right Arm, Patient Position: Sitting, Cuff Size: Normal)   Pulse 70   Resp 16   Ht 5' 10" (1.778 m)   Wt 207 lb 6.4 oz (94.1 kg)   SpO2 97%   BMI 29.76 kg/m  Wt Readings from Last 3 Encounters:  06/11/20 207 lb 6.4 oz (94.1 kg)  04/13/20 216 lb (98 kg)  01/07/20 205 lb  8 oz (93.2 kg)    Health Maintenance Due  Topic Date Due  . COVID-19 Vaccine (1) Never done    There are no preventive care reminders to display for this patient.   Lab Results  Component Value Date   TSH 1.01 08/07/2018   Lab Results  Component Value Date   WBC 9.5 12/20/2019   HGB 14.6 12/20/2019   HCT 45.8 12/20/2019   MCV 94.2 12/20/2019   PLT 180 12/20/2019   Lab Results  Component Value Date   NA 142 04/24/2020   K 4.1 04/24/2020   CO2 17 (L) 04/24/2020   GLUCOSE 90 04/24/2020   BUN 11 04/24/2020   CREATININE 0.89 04/24/2020   BILITOT 0.6 04/24/2020   ALKPHOS 109 04/24/2020   AST 31 04/24/2020   ALT 24 04/24/2020   PROT 7.2 04/24/2020   ALBUMIN 4.6 04/24/2020   CALCIUM 9.7 04/24/2020   ANIONGAP 7 12/20/2019   EGFR 98 04/24/2020   Lab Results  Component Value Date   CHOL 129 04/24/2020   Lab Results  Component Value Date   HDL 35 (L) 04/24/2020   Lab Results  Component Value Date   LDLCALC 69 04/24/2020   Lab Results  Component Value Date   TRIG 141 04/24/2020   Lab Results  Component Value Date   CHOLHDL 3.7 04/24/2020   Lab Results  Component Value Date   HGBA1C 6.3 (H) 04/24/2020       Assessment &  Plan:   Problem List Items Addressed This Visit      Nervous and Auditory   Lumbar radiculopathy - Primary RLE pain likely radiating pain from low back Prescribed short course of Prednisone Gabapentin 100 mg BID for neuropathic pain Takes Tramadol PRN Check X-ray lumbar spine Might need MRI to look for spinal stenosis or disc herniation Referred to Orthopedic surgery Avoid heavy lifting or frequent bending    Relevant Medications   predniSONE (DELTASONE) 20 MG tablet   gabapentin (NEURONTIN) 100 MG capsule   Other Relevant Orders   DG Lumbar Spine Complete   Ambulatory referral to Orthopedic Surgery    Other Visit Diagnoses    Diabetic eye exam [facility])       Relevant Orders   Ambulatory referral to Ophthalmology        Meds ordered this encounter  Medications  . predniSONE (DELTASONE) 20 MG tablet    Sig: Take 2 tablets (40 mg total) by mouth daily with breakfast.    Dispense:  10 tablet    Refill:  0  . gabapentin (NEURONTIN) 100 MG capsule    Sig: Take 1 capsule (100 mg total) by mouth 2 (two) times daily.    Dispense:  60 capsule    Refill:  1     Leonard Feigel Keith Rake, MD

## 2020-06-11 NOTE — Patient Instructions (Signed)
Please start taking Gabapentin and Prednisone as prescribed.  Please get X-ray of lumbar spine at [facility] are being referred to Orthopedic surgeon for evaluation of back and leg pain.

## 2020-06-16 ENCOUNTER — Other Ambulatory Visit: Payer: Self-pay | Admitting: Internal Medicine

## 2020-06-16 DIAGNOSIS — M1990 Unspecified osteoarthritis, unspecified site: Secondary | ICD-10-CM

## 2020-06-16 MED ORDER — TRAMADOL HCL 50 MG PO TABS: 50.0000 mg | ORAL_TABLET | Freq: Every day | ORAL | 0 refills | Status: DC

## 2020-06-23 ENCOUNTER — Encounter: Payer: Self-pay | Admitting: Orthopaedic Surgery

## 2020-06-23 ENCOUNTER — Other Ambulatory Visit: Payer: Self-pay

## 2020-06-23 ENCOUNTER — Ambulatory Visit (INDEPENDENT_AMBULATORY_CARE_PROVIDER_SITE_OTHER): Payer: BC Managed Care – PPO | Admitting: Orthopaedic Surgery

## 2020-06-23 VITALS — BP 176/97 | HR 82 | Ht 70.0 in | Wt 212.0 lb

## 2020-06-23 DIAGNOSIS — M5416 Radiculopathy, lumbar region: Secondary | ICD-10-CM

## 2020-06-23 DIAGNOSIS — R29898 Other symptoms and signs involving the musculoskeletal system: Secondary | ICD-10-CM

## 2020-06-23 MED ORDER — OXYCODONE-ACETAMINOPHEN 5-325 MG PO TABS: ORAL_TABLET | ORAL | 0 refills | Status: DC

## 2020-06-23 MED ORDER — CYCLOBENZAPRINE HCL 10 MG PO TABS: 10.0000 mg | ORAL_TABLET | Freq: Every day | ORAL | 0 refills | Status: DC

## 2020-06-23 NOTE — Progress Notes (Signed)
Subjective:    Patient ID: Bryan Rodgers, male    DOB: [birth year], 60 y.o.   MRN: [id number]  HPI He has seven to eight week history of lower back pain with right sided pain and sciatica.  He is getting much worse.  He has seen his primary care doctor two weeks ago and I have reviewed the notes.  He has developed weakness of the right lower leg and has area of numbness of the right lateral calf.  He is using a cane now as his leg has gotten weaker.  He has difficulty standing.  He has no trauma.  He has no bowel or bladder problems.  Nothing seems to help.  He has been on Toradol, prednisone, Naprosyn, ice, heat and Tylenol with no help  He is getting worse.  The weakness and numbness bother him in addition to the pain.   Review of Systems  Constitutional: Positive for activity change.  Respiratory: Positive for shortness of breath.   Musculoskeletal: Positive for arthralgias, back pain, gait problem and myalgias.  Neurological: Positive for weakness and headaches.  All other systems reviewed and are negative.  For Review of Systems, all other systems reviewed and are negative.  The following is a summary of the past history medically, past history surgically, known current medicines, social history and family history.  This information is gathered electronically by the computer from prior information and documentation.  I review this each visit and have found including this information at this point in the chart is beneficial and informative.   Past Medical History:  Diagnosis Date  . Alcoholic ([place]) quit 0263   10-15 beers per day on the weekends   . Anemia   . Arthritis   . Asthma    childhood asthma up to age 44  . Chest pain    Palpations  . Dyspnea   . Elevated CPK   . Hallucination   . Headache   . Hemorrhoids    with bleeding   . History of colonic polyps    removed via colonscope  . Hypertension   . Kidney stone   . Migraine   . Overweight (BMI 25.0-29.9)    . Prediabetes 10/2012   HBA1C is 6.4  . Tobacco abuse     Past Surgical History:  Procedure Laterality Date  . CATARACT EXTRACTION W/ INTRAOCULAR LENS IMPLANT Bilateral 2012  . COLONOSCOPY  2007   Dr. Oneida Alar: internal hemorrhoids, otherwise normal  . COLONOSCOPY  2004   reported history of colon polyp  . COLONOSCOPY WITH PROPOFOL N/A 07/26/2016   Procedure: COLONOSCOPY WITH PROPOFOL;  Surgeon: Danie Binder, MD;  [facility];  Service: Endoscopy;  Laterality: N/A;  830   . hemmorrhoidectomy  2002  . POLYPECTOMY  07/26/2016   Procedure: POLYPECTOMY;  Surgeon: Danie Binder, MD;  [facility];  Service: Endoscopy;;  splenic flexure polyps x4    Current Outpatient Medications on File Prior to Visit  Medication Sig Dispense Refill  . albuterol (PROVENTIL HFA;VENTOLIN HFA) 108 (90 Base) MCG/ACT inhaler Inhale 2 puffs into the lungs every 6 (six) hours as needed for wheezing or shortness of breath. 18 g 5  . aspirin EC 81 MG tablet Take 81 mg by mouth at bedtime.    Marland Kitchen azelastine (ASTELIN) 0.1 % nasal spray Place 2 sprays into both nostrils 2 (two) times daily. Use in each nostril as directed 30 mL 12  . budesonide-formoterol (SYMBICORT) 160-4.5 MCG/ACT inhaler  Inhale 2 puffs into the lungs 2 (two) times daily. 1 Inhaler 3  . chlorthalidone (HYGROTON) 25 MG tablet Take one-half tablet by  mouth daily 45 tablet 1  . gabapentin (NEURONTIN) 100 MG capsule Take 1 capsule (100 mg total) by mouth 2 (two) times daily. 60 capsule 1  . ibuprofen (ADVIL) 800 MG tablet Take 1 tablet (800 mg total) by mouth 3 (three) times daily. 21 tablet 0  . lamoTRIgine (LAMICTAL) 100 MG tablet Take 1 tablet (100 mg total) by mouth 2 (two) times daily. 60 tablet 11  . lamoTRIgine (LAMICTAL) 25 MG tablet 1 tab bid x one week 2 tab bid x 2nd week 3 tab bid x 3rd week 84 tablet 0  . loratadine (CLARITIN) 10 MG tablet Take 1 tablet (10 mg total) by mouth daily. 90 tablet 3  . metoprolol  succinate (TOPROL-XL) 25 MG 24 hr tablet TAKE 1 TABLET BY MOUTH ONCE DAILY. 90 tablet 3  . naproxen (NAPROSYN) 375 MG tablet Take 1 tablet (375 mg total) by mouth 2 (two) times daily with a meal. 20 tablet 0  . omeprazole (PRILOSEC) 40 MG capsule TAKE (1) CAPSULE BY MOUTH ONCE DAILY. 90 capsule 0  . predniSONE (DELTASONE) 20 MG tablet Take 2 tablets (40 mg total) by mouth daily with breakfast. 10 tablet 0  . rizatriptan (MAXALT-MLT) 10 MG disintegrating tablet DISSOLVE 1 TABLET ON TONGUE. MAY REPEAT IN 2 HOURS IF NEEDED. 9 tablet 11  . simvastatin (ZOCOR) 10 MG tablet Take 1 tablet (10 mg total) by mouth at bedtime. 90 tablet 1  . tamsulosin (FLOMAX) 0.4 MG CAPS capsule Take 1 capsule (0.4 mg total) by mouth daily. 30 capsule 0  . temazepam (RESTORIL) 30 MG capsule Take 1 capsule (30 mg total) by mouth at bedtime as needed for sleep. Disregard any previous orders for temazepam; Dr. Moshe Cipro is out of office this week, so I am refilling this for her. 30 capsule 2  . terbinafine (LAMISIL) 250 MG tablet Take 1 tablet (250 mg total) by mouth daily. 42 tablet 0  . triamcinolone (KENALOG) 0.025 % ointment APPLY SPARINGLY TWICE DAILY TO AFFECTED AREA FOR 1 WEEK, THEN AS DIRECTED. 15 g 0   No current facility-administered medications on file prior to visit.    Social History   Socioeconomic History  . Marital status: Married    Spouse name: Marti Sleigh  . Number of children: 0  . Years of education: 23  . Highest education level: Not on file  Occupational History  . Occupation: retired Corporate treasurer  Tobacco Use  . Smoking status: Former Smoker    Quit date: 03/16/2012    Years since quitting: 8.2  . Smokeless tobacco: Never Used  Substance and Sexual Activity  . Alcohol use: No    Alcohol/week: 0.0 standard drinks    Comment: History of alcohol use with beer on weekends, none since 2014   . Drug use: No  . Sexual activity: Yes  Other Topics Concern  . Not on file  Social History Narrative    Patient lives with wife.   Patient has no biological children. He has two stepchildren.   Patient is left handed.   Patient has a high school education.   No daily use of caffeine.   Social Determinants of Health   Financial Resource Strain: Not on file  Food Insecurity: Not on file  Transportation Needs: Not on file  Physical Activity: Not on file  Stress: Not on file  Social Connections:  Not on file  Intimate Partner Violence: Not on file    Family History  Problem Relation Age of Onset  . Breast cancer Mother   . Cancer Father        unsure of origin - spread to bones  . Kidney disease Brother   . Diabetes Sister   . Breast cancer Sister   . Colon cancer Neg Hx     BP (!) 176/97   Pulse 82   Ht 5\' 10"  (1.778 m)   Wt 212 lb (96.2 kg)   BMI 30.42 kg/m   Body mass index is 30.42 kg/m.      Objective:   Physical Exam Vitals and nursing note reviewed. Exam conducted with a chaperone present.  Constitutional:      Appearance: He is well-developed.  HENT:     Head: Normocephalic and atraumatic.  Eyes:     Conjunctiva/sclera: Conjunctivae normal.     Pupils: Pupils are equal, round, and reactive to light.  Cardiovascular:     Rate and Rhythm: Normal rate and regular rhythm.  Pulmonary:     Effort: Pulmonary effort is normal.  Abdominal:     Palpations: Abdomen is soft.  Musculoskeletal:     Cervical back: Normal range of motion and neck supple.       Legs:     Comments: Uses cane  Skin:    General: Skin is warm and dry.  Neurological:     Mental Status: He is alert and oriented to person, place, and time.     Cranial Nerves: No cranial nerve deficit.     Motor: No abnormal muscle tone.     Coordination: Coordination normal.     Deep Tendon Reflexes: Reflexes are normal and symmetric. Reflexes normal.  Psychiatric:        Behavior: Behavior normal.        Thought Content: Thought content normal.        Judgment: Judgment normal.   I have  independently reviewed and interpreted x-rays of this patient done at another site by another physician or qualified health professional.  He has had X-rays of the lower back by his PCP.        Assessment & Plan:   Encounter Diagnoses  Name Primary?  . Lumbar back pain with radiculopathy affecting right lower extremity Yes  . Weakness of right leg    I am very concerned about the right lower leg weakness and loss of reflex.  I am concerned he has HNP.  I would like to proceed to MRI asap.  I have given pain medicine.  I have reviewed the [facility] web site prior to prescribing narcotic medicine for this patient.   Return after MRI.  Call if any problem.  Precautions discussed.   Electronically Signed Sanjuana Kava, MD 5/24/20221:12 PM

## 2020-06-23 NOTE — Patient Instructions (Signed)
Call Central Scheduling  [phone] to schedule MRI.

## 2020-06-26 DIAGNOSIS — Z961 Presence of intraocular lens: Secondary | ICD-10-CM | POA: Diagnosis not present

## 2020-06-26 DIAGNOSIS — H524 Presbyopia: Secondary | ICD-10-CM | POA: Diagnosis not present

## 2020-06-26 LAB — HM DIABETES EYE EXAM

## 2020-06-30 ENCOUNTER — Encounter: Payer: Self-pay | Admitting: *Deleted

## 2020-07-01 ENCOUNTER — Telehealth: Payer: Self-pay | Admitting: Orthopaedic Surgery

## 2020-07-01 ENCOUNTER — Other Ambulatory Visit: Payer: Self-pay | Admitting: Family Medicine

## 2020-07-01 MED ORDER — OXYCODONE-ACETAMINOPHEN 5-325 MG PO TABS: 1.0000 | ORAL_TABLET | Freq: Four times a day (QID) | ORAL | 0 refills | Status: DC | PRN

## 2020-07-01 NOTE — Telephone Encounter (Signed)
Patient requests refill on Oxycodone-Acetaminophen 5-325 mgs.  Qty  30  Sig: One tablet every four hours as need for pain, 5 day limit.       Patient states he uses [facility]

## 2020-07-01 NOTE — Telephone Encounter (Signed)
No, he got Rx for 30 just a few weeks ago, too early.

## 2020-07-01 NOTE — Telephone Encounter (Signed)
Patient requests refill on Flexeril 10 mgs.  Qty 30  Sig: Take 1 tablet (10 mg total) by mouth at bedtime. One tablet every night at bedtime as needed for spasm.  Patient uses [facility]

## 2020-07-07 ENCOUNTER — Telehealth: Payer: Self-pay | Admitting: Orthopaedic Surgery

## 2020-07-07 MED ORDER — OXYCODONE-ACETAMINOPHEN 5-325 MG PO TABS: 1.0000 | ORAL_TABLET | Freq: Four times a day (QID) | ORAL | 0 refills | Status: DC | PRN

## 2020-07-07 NOTE — Telephone Encounter (Signed)
Patient spouse called to request pain medicine   oxyCODONE-acetaminophen (PERCOCET/ROXICET) 5-325 MG tablet  oxyCODONE-acetaminophen (PERCOCET/ROXICET) 5-325 MG tablet  Pharmacy: [facility]

## 2020-07-08 ENCOUNTER — Ambulatory Visit (HOSPITAL_COMMUNITY): Payer: BC Managed Care – PPO

## 2020-07-14 ENCOUNTER — Ambulatory Visit: Payer: BC Managed Care – PPO | Admitting: Orthopaedic Surgery

## 2020-07-14 ENCOUNTER — Ambulatory Visit: Payer: BC Managed Care – PPO | Admitting: Family Medicine

## 2020-07-14 ENCOUNTER — Telehealth: Payer: Self-pay | Admitting: Orthopaedic Surgery

## 2020-07-14 NOTE — Telephone Encounter (Signed)
Patient requests prescription for Oxycodone/Acetaminophen 5-325 mg.  Qty  26  Sig: Take 1 tablet by mouth every 6 (six) hours as needed for up to 7 days. Take one tablet every six hours as needed for pain.  Seven day limit  Patient uses [facility]

## 2020-07-16 ENCOUNTER — Telehealth: Payer: Self-pay

## 2020-07-16 MED ORDER — OXYCODONE-ACETAMINOPHEN 5-325 MG PO TABS: 1.0000 | ORAL_TABLET | Freq: Four times a day (QID) | ORAL | 0 refills | Status: DC | PRN

## 2020-07-16 NOTE — Telephone Encounter (Signed)
Patient is scheduled for MRI on 6/24. I spoke with his wife and return appt to see you is 6/28 @ 9 am.

## 2020-07-17 ENCOUNTER — Telehealth: Payer: Self-pay | Admitting: Orthopaedic Surgery

## 2020-07-23 ENCOUNTER — Telehealth: Payer: Self-pay | Admitting: Orthopaedic Surgery

## 2020-07-23 NOTE — Telephone Encounter (Signed)
Done

## 2020-07-23 NOTE — Telephone Encounter (Signed)
Patient requests refill: oxyCODONE-acetaminophen (PERCOCET/ROXICET) 5-325 MG tablet 20 tablet     [facility] * reminded patient of office policy of calling before noon on Thursdays

## 2020-07-23 NOTE — Telephone Encounter (Signed)
Patient requests refill: cyclobenzaprine (FLEXERIL) 10 MG tablet 30 tablet               [facility]

## 2020-07-24 ENCOUNTER — Ambulatory Visit (HOSPITAL_COMMUNITY)
Admission: RE | Admit: 2020-07-24 | Discharge: 2020-07-24 | Disposition: A | Discharge status: Home / Self Care | Payer: BC Managed Care – PPO | Source: Ambulatory Visit | Attending: Orthopaedic Surgery | Admitting: Orthopaedic Surgery

## 2020-07-24 ENCOUNTER — Encounter: Payer: Self-pay | Admitting: Internal Medicine

## 2020-07-24 ENCOUNTER — Ambulatory Visit (INDEPENDENT_AMBULATORY_CARE_PROVIDER_SITE_OTHER): Payer: BC Managed Care – PPO | Admitting: Internal Medicine

## 2020-07-24 ENCOUNTER — Other Ambulatory Visit: Payer: Self-pay

## 2020-07-24 VITALS — BP 158/79 | HR 79 | Temp 98.3°F | Resp 18 | Ht 69.0 in | Wt 208.0 lb

## 2020-07-24 DIAGNOSIS — M5416 Radiculopathy, lumbar region: Secondary | ICD-10-CM | POA: Insufficient documentation

## 2020-07-24 DIAGNOSIS — H6121 Impacted cerumen, right ear: Secondary | ICD-10-CM

## 2020-07-24 DIAGNOSIS — M545 Low back pain, unspecified: Secondary | ICD-10-CM | POA: Diagnosis not present

## 2020-07-24 MED ORDER — DEBROX 6.5 % OT SOLN: 5.0000 [drp] | Freq: Two times a day (BID) | OTIC | 0 refills | Status: DC

## 2020-07-24 NOTE — Progress Notes (Signed)
Acute Office Visit  Subjective:    Patient ID: Bryan Rodgers, male    DOB: October 17, 1960, 60 y.o.   MRN: [id number]  Chief Complaint  Patient presents with   Hearing Problem    Started feeling "fullness" in R ear x 1 week ago.     HPI Patient is in today for evaluation of right ear fullness for last 1 week, which started suddenly when he got up in the morning in last week. He denies any ear pain or discharge. Denies nasal congestion, sore throat or postnasal drip. Denies any fever or chills. He has tried using hair pin to clean ear wax.  Past Medical History:  Diagnosis Date   Alcoholic ([place]) quit 3419   10-15 beers per day on the weekends    Anemia    Arthritis    Asthma    childhood asthma up to age 1   Chest pain    Palpations   Dyspnea    Elevated CPK    Hallucination    Headache    Hemorrhoids    with bleeding    History of colonic polyps    removed via colonscope   Hypertension    Kidney stone    Migraine    Overweight (BMI 25.0-29.9)    Prediabetes 10/2012   HBA1C is 6.4   Tobacco abuse     Past Surgical History:  Procedure Laterality Date   CATARACT EXTRACTION W/ INTRAOCULAR LENS IMPLANT Bilateral 2012   COLONOSCOPY  2007   Dr. Oneida Alar: internal hemorrhoids, otherwise normal   COLONOSCOPY  2004   reported history of colon polyp   COLONOSCOPY WITH PROPOFOL N/A 07/26/2016   Procedure: COLONOSCOPY WITH PROPOFOL;  Surgeon: Danie Binder, MD;  Location: [facility];  Service: Endoscopy;  Laterality: N/A;  830    hemmorrhoidectomy  2002   POLYPECTOMY  07/26/2016   Procedure: POLYPECTOMY;  Surgeon: Danie Binder, MD;  Location: [facility];  Service: Endoscopy;;  splenic flexure polyps x4    Family History  Problem Relation Age of Onset   Breast cancer Mother    Cancer Father        unsure of origin - spread to bones   Kidney disease Brother    Diabetes Sister    Breast cancer Sister    Colon cancer Neg Hx     Social History    Socioeconomic History   Marital status: Married    Spouse name: Marti Sleigh   Number of children: 0   Years of education: 12   Highest education level: Not on file  Occupational History   Occupation: retired Corporate treasurer  Tobacco Use   Smoking status: Former    Pack years: 0.00    Types: Cigarettes    Quit date: 03/16/2012    Years since quitting: 8.3   Smokeless tobacco: Never  Substance and Sexual Activity   Alcohol use: No    Alcohol/week: 0.0 standard drinks    Comment: History of alcohol use with beer on weekends, none since 2014    Drug use: No   Sexual activity: Yes  Other Topics Concern   Not on file  Social History Narrative   Patient lives with wife.   Patient has no biological children. He has two stepchildren.   Patient is left handed.   Patient has a high school education.   No daily use of caffeine.   Social Determinants of Health   Financial Resource Strain: Not on file  Food Insecurity: Not on file  Transportation Needs: Not on file  Physical Activity: Not on file  Stress: Not on file  Social Connections: Not on file  Intimate Partner Violence: Not on file    Outpatient Medications Prior to Visit  Medication Sig Dispense Refill   aspirin EC 81 MG tablet Take 81 mg by mouth at bedtime.     azelastine (ASTELIN) 0.1 % nasal spray Place 2 sprays into both nostrils 2 (two) times daily. Use in each nostril as directed 30 mL 12   budesonide-formoterol (SYMBICORT) 160-4.5 MCG/ACT inhaler Inhale 2 puffs into the lungs 2 (two) times daily. 1 Inhaler 3   chlorthalidone (HYGROTON) 25 MG tablet Take one-half tablet by  mouth daily 45 tablet 1   cyclobenzaprine (FLEXERIL) 10 MG tablet TAKE 1 TABLET ONCE DAILY AT BEDTIME AS NEEDED FOR MUSCLE SPASM 30 tablet 0   gabapentin (NEURONTIN) 100 MG capsule Take 1 capsule (100 mg total) by mouth 2 (two) times daily. 60 capsule 1   ibuprofen (ADVIL) 800 MG tablet Take 1 tablet (800 mg total) by mouth 3 (three) times daily.  21 tablet 0   lamoTRIgine (LAMICTAL) 100 MG tablet Take 1 tablet (100 mg total) by mouth 2 (two) times daily. 60 tablet 11   lamoTRIgine (LAMICTAL) 25 MG tablet 1 tab bid x one week 2 tab bid x 2nd week 3 tab bid x 3rd week 84 tablet 0   loratadine (CLARITIN) 10 MG tablet Take 1 tablet (10 mg total) by mouth daily. 90 tablet 3   metoprolol succinate (TOPROL-XL) 25 MG 24 hr tablet TAKE 1 TABLET BY MOUTH ONCE DAILY. 90 tablet 3   naproxen (NAPROSYN) 375 MG tablet Take 1 tablet (375 mg total) by mouth 2 (two) times daily with a meal. 20 tablet 0   omeprazole (PRILOSEC) 40 MG capsule TAKE (1) CAPSULE BY MOUTH ONCE DAILY. 90 capsule 0   predniSONE (DELTASONE) 20 MG tablet Take 2 tablets (40 mg total) by mouth daily with breakfast. 10 tablet 0   rizatriptan (MAXALT-MLT) 10 MG disintegrating tablet DISSOLVE 1 TABLET ON TONGUE. MAY REPEAT IN 2 HOURS IF NEEDED. 9 tablet 11   simvastatin (ZOCOR) 10 MG tablet Take 1 tablet (10 mg total) by mouth at bedtime. 90 tablet 1   tamsulosin (FLOMAX) 0.4 MG CAPS capsule Take 1 capsule (0.4 mg total) by mouth daily. 30 capsule 0   temazepam (RESTORIL) 30 MG capsule Take 1 capsule (30 mg total) by mouth at bedtime as needed for sleep. Disregard any previous orders for temazepam; Dr. Moshe Cipro is out of office this week, so I am refilling this for her. 30 capsule 2   terbinafine (LAMISIL) 250 MG tablet Take 1 tablet (250 mg total) by mouth daily. 42 tablet 0   triamcinolone (KENALOG) 0.025 % ointment APPLY SPARINGLY TWICE DAILY TO AFFECTED AREA FOR 1 WEEK, THEN AS DIRECTED. 15 g 0   VENTOLIN HFA 108 (90 Base) MCG/ACT inhaler INHALE 2 PUFFS BY MOUTH EVERY 6 HOURS AS NEEDED FOR WHEEZING OR SHORTNESS OF BREATH 18 each 0   No facility-administered medications prior to visit.    No Known Allergies  Review of Systems  Constitutional:  Negative for chills and fever.  HENT:  Negative for ear discharge, ear pain, postnasal drip and sinus pain.   Respiratory:  Negative for  cough and shortness of breath.   Cardiovascular:  Negative for chest pain and palpitations.  Gastrointestinal:  Negative for diarrhea and nausea.  Skin:  Negative for  rash.  Neurological:  Negative for dizziness and headaches.      Objective:    Physical Exam Vitals reviewed.  Constitutional:      General: He is not in acute distress.    Appearance: He is not diaphoretic.  HENT:     Head: Normocephalic and atraumatic.     Right Ear: External ear normal. There is impacted cerumen (blood tinged).     Left Ear: External ear normal.     Ears:     Comments: Excess ear wax b/l    Nose: Nose normal.     Mouth/Throat:     Mouth: Mucous membranes are moist.  Eyes:     General: No scleral icterus.    Extraocular Movements: Extraocular movements intact.  Cardiovascular:     Rate and Rhythm: Normal rate and regular rhythm.     Pulses: Normal pulses.     Heart sounds: Normal heart sounds. No murmur heard. Pulmonary:     Breath sounds: Normal breath sounds. No wheezing or rales.  Musculoskeletal:     Cervical back: Neck supple. No tenderness.     Right lower leg: No edema.     Left lower leg: No edema.  Skin:    General: Skin is warm.     Findings: No rash.  Neurological:     General: No focal deficit present.     Mental Status: He is alert and oriented to person, place, and time.  Psychiatric:        Mood and Affect: Mood normal.        Behavior: Behavior normal.    BP (!) 158/79   Pulse 79   Temp 98.3 F (36.8 C)   Resp 18   Ht 5' 9" (1.753 m)   Wt 208 lb (94.3 kg)   SpO2 94%   BMI 30.72 kg/m  Wt Readings from Last 3 Encounters:  07/24/20 208 lb (94.3 kg)  06/23/20 212 lb (96.2 kg)  06/11/20 207 lb 6.4 oz (94.1 kg)    Health Maintenance Due  Topic Date Due   COVID-19 Vaccine (1) Never done   Zoster Vaccines- Shingrix (1 of 2) Never done    There are no preventive care reminders to display for this patient.   Lab Results  Component Value Date   TSH 1.01  08/07/2018   Lab Results  Component Value Date   WBC 9.5 12/20/2019   HGB 14.6 12/20/2019   HCT 45.8 12/20/2019   MCV 94.2 12/20/2019   PLT 180 12/20/2019   Lab Results  Component Value Date   NA 142 04/24/2020   K 4.1 04/24/2020   CO2 17 (L) 04/24/2020   GLUCOSE 90 04/24/2020   BUN 11 04/24/2020   CREATININE 0.89 04/24/2020   BILITOT 0.6 04/24/2020   ALKPHOS 109 04/24/2020   AST 31 04/24/2020   ALT 24 04/24/2020   PROT 7.2 04/24/2020   ALBUMIN 4.6 04/24/2020   CALCIUM 9.7 04/24/2020   ANIONGAP 7 12/20/2019   EGFR 98 04/24/2020   Lab Results  Component Value Date   CHOL 129 04/24/2020   Lab Results  Component Value Date   HDL 35 (L) 04/24/2020   Lab Results  Component Value Date   LDLCALC 69 04/24/2020   Lab Results  Component Value Date   TRIG 141 04/24/2020   Lab Results  Component Value Date   CHOLHDL 3.7 04/24/2020   Lab Results  Component Value Date   HGBA1C 6.3 (H) 04/24/2020  Assessment & Plan:   Problem List Items Addressed This Visit   None Visit Diagnoses     Impacted cerumen of right ear    -  Primary Excess ear wax b/l Impacted over right side, with blood-tinging, likely due to use of hair pain Right ear irrigation in the office today Debrox ear drops Avoid sharp objects for cleaning purposes   Relevant Medications   carbamide peroxide (DEBROX) 6.5 % OTIC solution        Meds ordered this encounter  Medications   carbamide peroxide (DEBROX) 6.5 % OTIC solution    Sig: Place 5 drops into both ears 2 (two) times daily.    Dispense:  15 mL    Refill:  0     Corky Blumstein Keith Rake, MD

## 2020-07-27 MED ORDER — OXYCODONE-ACETAMINOPHEN 5-325 MG PO TABS: 1.0000 | ORAL_TABLET | Freq: Four times a day (QID) | ORAL | 0 refills | Status: DC | PRN

## 2020-07-27 MED ORDER — CYCLOBENZAPRINE HCL 10 MG PO TABS: ORAL_TABLET | ORAL | 0 refills | Status: DC

## 2020-07-28 ENCOUNTER — Encounter: Payer: Self-pay | Admitting: Orthopaedic Surgery

## 2020-07-28 ENCOUNTER — Other Ambulatory Visit: Payer: Self-pay

## 2020-07-28 ENCOUNTER — Ambulatory Visit (INDEPENDENT_AMBULATORY_CARE_PROVIDER_SITE_OTHER): Payer: BC Managed Care – PPO | Admitting: Orthopaedic Surgery

## 2020-07-28 VITALS — BP 155/88 | HR 80 | Ht 70.0 in | Wt 209.8 lb

## 2020-07-28 DIAGNOSIS — M5416 Radiculopathy, lumbar region: Secondary | ICD-10-CM | POA: Diagnosis not present

## 2020-07-28 NOTE — Progress Notes (Signed)
My back still hurts.  He had MRI of the lumbar spine and it showed:  IMPRESSION: Right L5 nerve root impingement in the foramen due to facet degeneration and spurring and a 5 mm synovial cyst projecting into the right foramen.   Mild degenerative change throughout the lumbar spine without additional area of neural impingement.  I have independently reviewed the MRI.    He was delayed in getting the MRI because he got COVID-19.  He has right sided sciatica that has not gotten better.  I called in refill for his pain medicine yesterday.  He is using a cane.  He has tightness of the lower back, more on the right, right sided sciatica and positive SLR at 20 degrees.    Encounter Diagnosis  Name Primary?   Lumbar back pain with radiculopathy affecting right lower extremity Yes   I will have neurosurgeon see him.  I have explained this to him and his wife who is present.  Electronically Signed Sanjuana Kava, MD 6/28/202211:23 AM

## 2020-07-28 NOTE — Patient Instructions (Signed)
You have been referred to [facility] on [address] in [place], they will call you with appointment. If you have not heard anything in one week call them to schedule [phone]   

## 2020-08-04 ENCOUNTER — Telehealth: Payer: Self-pay | Admitting: Orthopaedic Surgery

## 2020-08-04 MED ORDER — OXYCODONE-ACETAMINOPHEN 5-325 MG PO TABS: 1.0000 | ORAL_TABLET | Freq: Four times a day (QID) | ORAL | 0 refills | Status: DC | PRN

## 2020-08-04 NOTE — Telephone Encounter (Signed)
Patient request refill for pain medicine    oxyCODONE-acetaminophen (PERCOCET/ROXICET) 5-325 MG tablet    Pharmacy : [facility]

## 2020-08-11 ENCOUNTER — Telehealth: Payer: Self-pay | Admitting: Orthopaedic Surgery

## 2020-08-11 ENCOUNTER — Ambulatory Visit: Payer: BC Managed Care – PPO | Admitting: Family Medicine

## 2020-08-11 ENCOUNTER — Other Ambulatory Visit: Payer: Self-pay

## 2020-08-11 ENCOUNTER — Encounter: Payer: Self-pay | Admitting: Family Medicine

## 2020-08-11 VITALS — BP 170/87 | HR 80 | Resp 16 | Ht 69.0 in | Wt 209.0 lb

## 2020-08-11 DIAGNOSIS — Z23 Encounter for immunization: Secondary | ICD-10-CM | POA: Diagnosis not present

## 2020-08-11 DIAGNOSIS — R7303 Prediabetes: Secondary | ICD-10-CM | POA: Diagnosis not present

## 2020-08-11 DIAGNOSIS — E559 Vitamin D deficiency, unspecified: Secondary | ICD-10-CM

## 2020-08-11 DIAGNOSIS — R7301 Impaired fasting glucose: Secondary | ICD-10-CM

## 2020-08-11 DIAGNOSIS — M5416 Radiculopathy, lumbar region: Secondary | ICD-10-CM

## 2020-08-11 DIAGNOSIS — E1169 Type 2 diabetes mellitus with other specified complication: Secondary | ICD-10-CM

## 2020-08-11 DIAGNOSIS — E785 Hyperlipidemia, unspecified: Secondary | ICD-10-CM

## 2020-08-11 DIAGNOSIS — I10 Essential (primary) hypertension: Secondary | ICD-10-CM

## 2020-08-11 DIAGNOSIS — G43109 Migraine with aura, not intractable, without status migrainosus: Secondary | ICD-10-CM

## 2020-08-11 DIAGNOSIS — F5101 Primary insomnia: Secondary | ICD-10-CM

## 2020-08-11 DIAGNOSIS — E663 Overweight: Secondary | ICD-10-CM

## 2020-08-11 MED ORDER — AMLODIPINE BESYLATE 5 MG PO TABS: 5.0000 mg | ORAL_TABLET | Freq: Every day | ORAL | 3 refills | Status: DC

## 2020-08-11 MED ORDER — OXYCODONE-ACETAMINOPHEN 5-325 MG PO TABS: 1.0000 | ORAL_TABLET | Freq: Four times a day (QID) | ORAL | 0 refills | Status: DC | PRN

## 2020-08-11 MED ORDER — TEMAZEPAM 30 MG PO CAPS: 30.0000 mg | ORAL_CAPSULE | Freq: Every evening | ORAL | 5 refills | Status: DC | PRN

## 2020-08-11 MED ORDER — CHLORTHALIDONE 25 MG PO TABS: 25.0000 mg | ORAL_TABLET | Freq: Every day | ORAL | 3 refills | Status: DC

## 2020-08-11 NOTE — Telephone Encounter (Signed)
Patient requests prescription for Oxycodone/Acetaminophen 5-325 mgs.  Qty  28  Sig: Take 1 tablet by mouth every 6 (six) hours as needed for up to 7 days. Take one tablet every six hours as needed for pain.  Seven day limit         Patient uses [facility]

## 2020-08-11 NOTE — Patient Instructions (Addendum)
Annual exam in office with MD in 8 to 10 weeks reevaluate blood pressure and for Shingrix vaccine #2.  Shingrix No. 1 in office today.  Blood pressure is elevated and uncontrolled   New additional medicine is amlodipine 5 mg 1 tablet daily.  Continue metoprolol and chlorthalidone as before.  Please reconsider the COVID-vaccine this is highly recommended to reduce your risk of getting seriously ill with COVID should you become infected.  Please get fasting CBC lipid CMP and EGFR HbA1c TSH and vitamin D 20 to 5 days before your next visit.  It is important that you exercise regularly at least 30 minutes 5 times a week. If you develop chest pain, have severe difficulty breathing, or feel very tired, stop exercising immediately and seek medical attention  All the bast with back  Thanks for choosing [facility], we consider it a privelige to serve you.

## 2020-08-11 NOTE — Progress Notes (Signed)
Bryan Rodgers     MRN: [id number]      DOB: 1960-07-28   HPI Bryan Rodgers is here for follow up and re-evaluation of chronic medical conditions, medication management and review of any available recent lab and radiology data.  Preventive health is updated, specifically  Cancer screening and Immunization.   Questions or concerns regarding consultations or procedures which the PT has had in the interim are  addressed. The PT denies any adverse reactions to current medications since the last visit.  C/o uncontrolled back and hippain   ROS Denies recent fever or chills. Denies sinus pressure, nasal congestion, ear pain or sore throat. Denies chest congestion, productive cough or wheezing. Denies chest pains, palpitations and leg swelling Denies abdominal pain, nausea, vomiting,diarrhea or constipation.   Denies dysuria, frequency, hesitancy or incontinence.  Denies headaches, seizures, numbness, or tingling. Denies depression, anxiety or insomnia. Denies skin break down or rash.   PE  BP (!) 170/87   Pulse 80   Resp 16   Ht 5\' 9"  (1.753 m)   Wt 209 lb (94.8 kg)   SpO2 93%   BMI 30.86 kg/m   Patient alert and oriented and in no cardiopulmonary distress.  HEENT: No facial asymmetry, EOMI,     Neck supple .  Chest: Clear to auscultation bilaterally.  CVS: S1, S2 no murmurs, no S3.Regular rate.  ABD: Soft non tender.   Ext: No edema  MS: Adequate ROM spine, shoulders, hips and knees.  Skin: Intact, no ulcerations or rash noted.  Psych: Good eye contact, normal affect. Memory intact not anxious or depressed appearing.  CNS: CN 2-12 intact, power,  normal throughout.no focal deficits noted.   Assessment & Plan  HTN (hypertension) Uncontrolled, add amlodipine and re eval in 6 weeks DASH diet and commitment to daily physical activity for a minimum of 30 minutes discussed and encouraged, as a part of hypertension management. The importance of attaining a healthy  weight is also discussed.  BP/Weight 08/11/2020 07/28/2020 07/24/2020 06/23/2020 06/11/2020 04/13/2020 97/07/7339  Systolic BP 937 902 409 735 329 924 268  Diastolic BP 87 88 79 97 82 67 82  Wt. (Lbs) 209 209.8 208 212 207.4 216 205.5  BMI 30.86 30.1 30.72 30.42 29.76 30.99 29.49       Insomnia Sleep hygiene reviewed and written information offered also. Prescription sent for  medication needed.   Migraine headache Managed by neurology and well controlled  Prediabetes Patient educated about the importance of limiting  Carbohydrate intake , the need to commit to daily physical activity for a minimum of 30 minutes , and to commit weight loss. The fact that changes in all these areas will reduce or eliminate all together the development of diabetes is stressed.   Diabetic Labs Latest Ref Rng & Units 04/24/2020 12/20/2019 07/03/2019 01/02/2019 08/07/2018  HbA1c 4.8 - 5.6 % 6.3(H) - 5.9(H) 6.0(H) 6.3(H)  Microalbumin Not Estab. ug/mL - - - - -  Micro/Creat Ratio 0.0 - 30.0 mg/g creat - - - - -  Chol 100 - 199 mg/dL 129 - - 101 132  HDL >39 mg/dL 35(L) - - 37(L) 36(L)  Calc LDL 0 - 99 mg/dL 69 - - 48 74  Triglycerides 0 - 149 mg/dL 141 - - 78 141  Creatinine 0.76 - 1.27 mg/dL 0.89 1.23 1.10 1.02 1.07   BP/Weight 08/11/2020 07/28/2020 07/24/2020 06/23/2020 06/11/2020 04/13/2020 34/02/9620  Systolic BP 297 989 211 941 740 814 481  Diastolic BP 87 88  79 97 82 67 82  Wt. (Lbs) 209 209.8 208 212 207.4 216 205.5  BMI 30.86 30.1 30.72 30.42 29.76 30.99 29.49   Foot/eye exam completion dates Latest Ref Rng & Units 06/26/2020 07/25/2017  Eye Exam No Retinopathy No Retinopathy -  Foot Form Completion - - Done   ' Updated lab needed at/ before next visit.   Overweight (BMI 25.0-29.9)  Patient re-educated about  the importance of commitment to a  minimum of 150 minutes of exercise per week as able.  The importance of healthy food choices with portion control discussed, as well as eating regularly and  within a 12 hour window most days. The need to choose "clean , green" food 50 to 75% of the time is discussed, as well as to make water the primary drink and set a goal of 64 ounces water daily.    Weight /BMI 08/11/2020 07/28/2020 07/24/2020  WEIGHT 209 lb 209 lb 12.8 oz 208 lb  HEIGHT 5\' 9"  5\' 10"  5\' 9"   BMI 30.86 kg/m2 30.1 kg/m2 30.72 kg/m2      Lumbar radiculopathy Uncontrolled  Has appt in am with Ortho

## 2020-08-11 NOTE — Telephone Encounter (Signed)
Patient requests Flexeril 10 mgs.  Qty 30  Sig: TAKE 1 TABLET ONCE DAILY AT BEDTIME AS NEEDED FOR MUSCLE SPASM  Patient uses [facility]

## 2020-08-12 ENCOUNTER — Encounter: Payer: Self-pay | Admitting: Family Medicine

## 2020-08-12 DIAGNOSIS — M7138 Other bursal cyst, other site: Secondary | ICD-10-CM | POA: Diagnosis not present

## 2020-08-12 DIAGNOSIS — Z6830 Body mass index (BMI) 30.0-30.9, adult: Secondary | ICD-10-CM | POA: Diagnosis not present

## 2020-08-12 DIAGNOSIS — I10 Essential (primary) hypertension: Secondary | ICD-10-CM | POA: Diagnosis not present

## 2020-08-12 NOTE — Assessment & Plan Note (Signed)
Sleep hygiene reviewed and written information offered also. Prescription sent for  medication needed.  

## 2020-08-12 NOTE — Assessment & Plan Note (Signed)
Uncontrolled, add amlodipine and re eval in 6 weeks DASH diet and commitment to daily physical activity for a minimum of 30 minutes discussed and encouraged, as a part of hypertension management. The importance of attaining a healthy weight is also discussed.  BP/Weight 08/11/2020 07/28/2020 07/24/2020 06/23/2020 06/11/2020 04/13/2020 99/03/4266  Systolic BP 341 962 229 798 921 194 174  Diastolic BP 87 88 79 97 82 67 82  Wt. (Lbs) 209 209.8 208 212 207.4 216 205.5  BMI 30.86 30.1 30.72 30.42 29.76 30.99 29.49

## 2020-08-12 NOTE — Assessment & Plan Note (Signed)
  Patient re-educated about  the importance of commitment to a  minimum of 150 minutes of exercise per week as able.  The importance of healthy food choices with portion control discussed, as well as eating regularly and within a 12 hour window most days. The need to choose "clean , green" food 50 to 75% of the time is discussed, as well as to make water the primary drink and set a goal of 64 ounces water daily.    Weight /BMI 08/11/2020 07/28/2020 07/24/2020  WEIGHT 209 lb 209 lb 12.8 oz 208 lb  HEIGHT 5\' 9"  5\' 10"  5\' 9"   BMI 30.86 kg/m2 30.1 kg/m2 30.72 kg/m2

## 2020-08-12 NOTE — Assessment & Plan Note (Signed)
Patient educated about the importance of limiting  Carbohydrate intake , the need to commit to daily physical activity for a minimum of 30 minutes , and to commit weight loss. The fact that changes in all these areas will reduce or eliminate all together the development of diabetes is stressed.   Diabetic Labs Latest Ref Rng & Units 04/24/2020 12/20/2019 07/03/2019 01/02/2019 08/07/2018  HbA1c 4.8 - 5.6 % 6.3(H) - 5.9(H) 6.0(H) 6.3(H)  Microalbumin Not Estab. ug/mL - - - - -  Micro/Creat Ratio 0.0 - 30.0 mg/g creat - - - - -  Chol 100 - 199 mg/dL 129 - - 101 132  HDL >39 mg/dL 35(L) - - 37(L) 36(L)  Calc LDL 0 - 99 mg/dL 69 - - 48 74  Triglycerides 0 - 149 mg/dL 141 - - 78 141  Creatinine 0.76 - 1.27 mg/dL 0.89 1.23 1.10 1.02 1.07   BP/Weight 08/11/2020 07/28/2020 07/24/2020 06/23/2020 06/11/2020 04/13/2020 12/09/4172  Systolic BP 081 448 185 631 497 026 378  Diastolic BP 87 88 79 97 82 67 82  Wt. (Lbs) 209 209.8 208 212 207.4 216 205.5  BMI 30.86 30.1 30.72 30.42 29.76 30.99 29.49   Foot/eye exam completion dates Latest Ref Rng & Units 06/26/2020 07/25/2017  Eye Exam No Retinopathy No Retinopathy -  Foot Form Completion - - Done   ' Updated lab needed at/ before next visit.

## 2020-08-12 NOTE — Assessment & Plan Note (Signed)
Uncontrolled  Has appt in am with Ortho

## 2020-08-12 NOTE — Assessment & Plan Note (Signed)
Managed by neurology and well controlled

## 2020-08-17 ENCOUNTER — Other Ambulatory Visit: Payer: Self-pay | Admitting: Family Medicine

## 2020-08-18 ENCOUNTER — Telehealth: Payer: Self-pay | Admitting: Orthopaedic Surgery

## 2020-08-18 ENCOUNTER — Other Ambulatory Visit: Payer: Self-pay | Admitting: Neurosurgery

## 2020-08-18 MED ORDER — OXYCODONE-ACETAMINOPHEN 5-325 MG PO TABS: 1.0000 | ORAL_TABLET | Freq: Four times a day (QID) | ORAL | 0 refills | Status: DC | PRN

## 2020-08-18 NOTE — Telephone Encounter (Signed)
Patient requests refill on pain medication - discussed referral to neurosurgeon; states has not been scheduled yet. Said tried to call them back yesterday; verified that they have the correct phone number. Please advise if he may have a refill: oxyCODONE-acetaminophen (PERCOCET/ROXICET) 5-325 MG tablet 25 tablet              [facility]

## 2020-08-19 ENCOUNTER — Other Ambulatory Visit: Payer: Self-pay | Admitting: Neurosurgery

## 2020-08-24 NOTE — Progress Notes (Addendum)
Surgical Instructions    Your procedure is scheduled on 08/27/20.  Report to [facility] Main Entrance "A" at 05:30 A.M., then check in with the Admitting office.  Call this number if you have problems the morning of surgery:  [phone]   If you have any questions prior to your surgery date call [phone]: Open Monday-Friday 8am-4pm    Remember:  Do not eat or drink after midnight the night before your surgery      Take these medicines the morning of surgery with A SIP OF WATER  amLODipine (NORVASC)  azelastine (ASTELIN) nasal spray budesonide-formoterol (SYMBICORT) gabapentin (NEURONTIN) lamoTRIgine (LAMICTAL) loratadine (CLARITIN)  metoprolol succinate (TOPROL-XL) omeprazole (PRILOSEC)  rizatriptan (MAXALT-MLT) if needed tamsulosin (FLOMAX)  terbinafine (LAMISIL)  VENTOLIN HFA Inhaler if needed  *Bring all inhalers with you the day of surgery*  As of today, STOP taking any Aspirin (unless otherwise instructed by your surgeon) Aleve, Naproxen, Ibuprofen, Motrin, Advil, Goody's, BC's, all herbal medications, fish oil, and all vitamins.  WHAT DO I DO ABOUT MY DIABETES MEDICATION?   Do not take oral diabetes medicines (pills) the morning of surgery.   The day of surgery, do not take other diabetes injectables, including Byetta (exenatide), Bydureon (exenatide ER), Victoza (liraglutide), or Trulicity (dulaglutide).  If your CBG is greater than 220 mg/dL, you may take  of your sliding scale (correction) dose of insulin.   HOW TO MANAGE YOUR DIABETES BEFORE AND AFTER SURGERY  Why is it important to control my blood sugar before and after surgery? Improving blood sugar levels before and after surgery helps healing and can limit problems. A way of improving blood sugar control is eating a healthy diet by:  Eating less sugar and carbohydrates  Increasing activity/exercise  Talking with your doctor about reaching your blood sugar goals High blood sugars (greater than  180 mg/dL) can raise your risk of infections and slow your recovery, so you will need to focus on controlling your diabetes during the weeks before surgery. Make sure that the doctor who takes care of your diabetes knows about your planned surgery including the date and location.  How do I manage my blood sugar before surgery? Check your blood sugar at least 4 times a day, starting 2 days before surgery, to make sure that the level is not too high or low.  Check your blood sugar the morning of your surgery when you wake up and every 2 hours until you get to the Short Stay unit.  If your blood sugar is less than 70 mg/dL, you will need to treat for low blood sugar: Do not take insulin. Treat a low blood sugar (less than 70 mg/dL) with  cup of clear juice (cranberry or apple), 4 glucose tablets, OR glucose gel. Recheck blood sugar in 15 minutes after treatment (to make sure it is greater than 70 mg/dL). If your blood sugar is not greater than 70 mg/dL on recheck, call [phone] for further instructions. Report your blood sugar to the short stay nurse when you get to Short Stay.  If you are admitted to the hospital after surgery: Your blood sugar will be checked by the staff and you will probably be given insulin after surgery (instead of oral diabetes medicines) to make sure you have good blood sugar levels. The goal for blood sugar control after surgery is 80-180 mg/dL.           Do not wear jewelry or makeup Do not wear lotions, powders, colognes, or deodorant. Men  may shave face and neck. Do not bring valuables to the hospital.  DO Not wear nail polish, gel polish, artificial nails, or any other type of covering on natural nails  including finger and toenails. If patients have artificial nails, gel coating, etc. that need to be removed by a nail salon please have this removed prior to surgery or surgery may need to be canceled/delayed if the surgeon/ anesthesia feels like the patient  is unable to be adequately monitored.             [facility] is not responsible for any belongings or valuables.  Do NOT Smoke (Tobacco/Vaping) or drink Alcohol 24 hours prior to your procedure If you use a CPAP at night, you may bring all equipment for your overnight stay.   Contacts, glasses, dentures or bridgework may not be worn into surgery, please bring cases for these belongings   For patients admitted to the hospital, discharge time will be determined by your treatment team.   Patients discharged the day of surgery will not be allowed to drive home, and someone needs to stay with them for 24 hours.  ONLY 1 SUPPORT PERSON MAY BE PRESENT WHILE YOU ARE IN SURGERY. IF YOU ARE TO BE ADMITTED ONCE YOU ARE IN YOUR ROOM YOU WILL BE ALLOWED TWO (2) VISITORS.  Minor children may have two parents present. Special consideration for safety and communication needs will be reviewed on a case by case basis.  Special instructions:    Oral Hygiene is also important to reduce your risk of infection.  Remember - BRUSH YOUR TEETH THE MORNING OF SURGERY WITH YOUR REGULAR TOOTHPASTE   - Preparing For Surgery  Before surgery, you can play an important role. Because skin is not sterile, your skin needs to be as free of germs as possible. You can reduce the number of germs on your skin by washing with CHG (chlorahexidine gluconate) Soap before surgery.  CHG is an antiseptic cleaner which kills germs and bonds with the skin to continue killing germs even after washing.     Please do not use if you have an allergy to CHG or antibacterial soaps. If your skin becomes reddened/irritated stop using the CHG.  Do not shave (including legs and underarms) for at least 48 hours prior to first CHG shower. It is OK to shave your face.  Please follow these instructions carefully.     Shower the NIGHT BEFORE SURGERY and the MORNING OF SURGERY with CHG Soap.   If you chose to wash your hair, wash your hair  first as usual with your normal shampoo. After you shampoo, rinse your hair and body thoroughly to remove the shampoo.  Then [employer] and genitals (private parts) with your normal soap and rinse thoroughly to remove soap.  After that Use CHG Soap as you would any other liquid soap. You can apply CHG directly to the skin and wash gently with a scrungie or a clean washcloth.   Apply the CHG Soap to your body ONLY FROM THE NECK DOWN.  Do not use on open wounds or open sores. Avoid contact with your eyes, ears, mouth and genitals (private parts). Wash Face and genitals (private parts)  with your normal soap.   Wash thoroughly, paying special attention to the area where your surgery will be performed.  Thoroughly rinse your body with warm water from the neck down.  DO NOT shower/wash with your normal soap after using and rinsing off the CHG  Soap.  Pat yourself dry with a CLEAN TOWEL.  Wear CLEAN PAJAMAS to bed the night before surgery  Place CLEAN SHEETS on your bed the night before your surgery  DO NOT SLEEP WITH PETS.   Day of Surgery: Take a shower with CHG soap. Wear Clean/Comfortable clothing the morning of surgery Do not apply any deodorants/lotions.   Remember to brush your teeth WITH YOUR REGULAR TOOTHPASTE.   Please read over the following fact sheets that you were given.

## 2020-08-25 ENCOUNTER — Encounter (HOSPITAL_COMMUNITY): Payer: Self-pay

## 2020-08-25 ENCOUNTER — Encounter (HOSPITAL_COMMUNITY)
Admission: RE | Admit: 2020-08-25 | Discharge: 2020-08-25 | Disposition: A | Discharge status: Home / Self Care | Payer: BC Managed Care – PPO | Source: Ambulatory Visit | Attending: Neurosurgery | Admitting: Neurosurgery

## 2020-08-25 ENCOUNTER — Other Ambulatory Visit: Payer: Self-pay

## 2020-08-25 DIAGNOSIS — Z01818 Encounter for other preprocedural examination: Secondary | ICD-10-CM | POA: Diagnosis not present

## 2020-08-25 DIAGNOSIS — Z20822 Contact with and (suspected) exposure to covid-19: Secondary | ICD-10-CM | POA: Insufficient documentation

## 2020-08-25 HISTORY — DX: Myoneural disorder, unspecified: G70.9

## 2020-08-25 HISTORY — DX: Gastro-esophageal reflux disease without esophagitis: K21.9

## 2020-08-25 HISTORY — DX: Pneumonia, unspecified organism: J18.9

## 2020-08-25 LAB — CBC
HCT: 48.1 % (ref 39.0–52.0)
Hemoglobin: 15.3 g/dL (ref 13.0–17.0)
MCH: 28.8 pg (ref 26.0–34.0)
MCHC: 31.8 g/dL (ref 30.0–36.0)
MCV: 90.6 fL (ref 80.0–100.0)
Platelets: 216 10*3/uL (ref 150–400)
RBC: 5.31 MIL/uL (ref 4.22–5.81)
RDW: 14.4 % (ref 11.5–15.5)
WBC: 8.2 10*3/uL (ref 4.0–10.5)
nRBC: 0 % (ref 0.0–0.2)

## 2020-08-25 LAB — COMPREHENSIVE METABOLIC PANEL
ALT: 21 U/L (ref 0–44)
AST: 35 U/L (ref 15–41)
Albumin: 4.3 g/dL (ref 3.5–5.0)
Alkaline Phosphatase: 95 U/L (ref 38–126)
Anion gap: 7 (ref 5–15)
BUN: 17 mg/dL (ref 8–23)
CO2: 25 mmol/L (ref 22–32)
Calcium: 9.6 mg/dL (ref 8.9–10.3)
Chloride: 104 mmol/L (ref 98–111)
Creatinine, Ser: 0.97 mg/dL (ref 0.61–1.24)
GFR, Estimated: >60 mL/min (ref >60)
Glucose, Bld: 94 mg/dL (ref 70–99)
Potassium: 4.7 mmol/L (ref 3.5–5.1)
Sodium: 136 mmol/L (ref 135–145)
Total Bilirubin: 1.3 mg/dL — ABNORMAL HIGH (ref 0.3–1.2)
Total Protein: 7.3 g/dL (ref 6.5–8.1)

## 2020-08-25 LAB — SURGICAL PCR SCREEN
MRSA, PCR: NEGATIVE
Staphylococcus aureus: NEGATIVE

## 2020-08-25 LAB — SARS CORONAVIRUS 2 (TAT 6-24 HRS): SARS Coronavirus 2: NEGATIVE

## 2020-08-25 NOTE — Progress Notes (Addendum)
PCP - Tula Nakayama Cardiologist - denies  PPM/ICD - denies   Chest x-ray - n/a EKG - 08/25/20 Stress Test - over 15 years ago, normal per pt. Only needed to get it because his work required it ECHO - 01/23/20 Cardiac Cath - denies  Sleep Study - records requested from [facility] in [place] CPAP - does not wear nightly, stopped wearing it on his own  Pre diabetes. Does not check CBG at home  As of today, STOP taking any Aspirin (unless otherwise instructed by your surgeon) Aleve, Naproxen, Ibuprofen, Motrin, Advil, Goody's, BC's, all herbal medications, fish oil, and all vitamins.  ERAS Protcol -no   COVID TEST- 08/25/20 in PAT   Anesthesia review: yes, requested records from sleep study (8-10 years ago)  Patient denies shortness of breath, fever, cough and chest pain at PAT appointment   All instructions explained to the patient, with a verbal understanding of the material. Patient agrees to go over the instructions while at home for a better understanding. Patient also instructed to self quarantine after being tested for COVID-19. The opportunity to ask questions was provided.

## 2020-08-26 LAB — HEMOGLOBIN A1C
Hgb A1c MFr Bld: 6.4 % — ABNORMAL HIGH (ref 4.8–5.6)
Mean Plasma Glucose: 137 mg/dL

## 2020-08-26 NOTE — Anesthesia Preprocedure Evaluation (Addendum)
Anesthesia Evaluation  Patient identified by MRN, date of birth, ID band Patient awake    Reviewed: Allergy & Precautions, NPO status , Patient's Chart, lab work & pertinent test results  Airway Mallampati: III  TM Distance: >3 FB Neck ROM: Full    Dental  (+) Chipped,    Pulmonary asthma , former smoker,    Pulmonary exam normal breath sounds clear to auscultation       Cardiovascular hypertension, Pt. on medications and Pt. on home beta blockers Normal cardiovascular exam Rhythm:Regular Rate:Normal  ECG: rate 99  ECHO: 1. Left ventricular ejection fraction, by estimation, is 55 to 60%. The left ventricle has normal function. 2. The left ventricle has no regional wall motion abnormalities. There is mild concentric left ventricular hypertrophy. 3. Left ventricular diastolic parameters are consistent with Grade II diastolic dysfunction (pseudonormalization). 4. The average left ventricular global longitudinal strain is -15.7 %. The global longitudinal strain is mildly abnormal. 5. Right ventricular systolic function is normal. The right ventricular size is normal. There is mildly elevated pulmonary artery systolic pressure. 6. The mitral valve is normal in structure. Mild mitral valve regurgitation. 7. Tricuspid valve regurgitation is moderate. 8. The aortic valve is tricuspid. There is mild calcification of the aortic valve. There is mild thickening of the aortic valve. Aortic valve regurgitation is not visualized. Mild aortic valve sclerosis is present, with no evidence of aortic valve stenosis. 9. The inferior vena cava is normal in size with greater than 50% respiratory variability, suggesting right atrial pressure of 3 mmHg. 10. No atrial level shunt detected by color flow Doppler.   Neuro/Psych  Headaches, PSYCHIATRIC DISORDERS  Neuromuscular disease    GI/Hepatic GERD  Medicated and Controlled,(+)     substance abuse   ,   Endo/Other    Renal/GU      Musculoskeletal  (+) Arthritis , narcotic dependent  Abdominal   Peds  Hematology HLD   Anesthesia Other Findings SYNOVIAL CYST OF LUMBAR SPINE  Reproductive/Obstetrics                           Anesthesia Physical Anesthesia Plan  ASA: 2  Anesthesia Plan: General   Post-op Pain Management:    Induction: Intravenous  PONV Risk Score and Plan: 2 and Ondansetron, Dexamethasone, Midazolam and Treatment may vary due to age or medical condition  Airway Management Planned: Oral ETT  Additional Equipment:   Intra-op Plan:   Post-operative Plan: Extubation in OR  Informed Consent: I have reviewed the patients History and Physical, chart, labs and discussed the procedure including the risks, benefits and alternatives for the proposed anesthesia with the patient or authorized representative who has indicated his/her understanding and acceptance.     Dental advisory given  Plan Discussed with: CRNA  Anesthesia Plan Comments:         Anesthesia Quick Evaluation

## 2020-08-27 ENCOUNTER — Ambulatory Visit (HOSPITAL_COMMUNITY): Payer: BC Managed Care – PPO | Admitting: Physician Assistant

## 2020-08-27 ENCOUNTER — Observation Stay (HOSPITAL_COMMUNITY)
Admission: RE | Admit: 2020-08-27 | Discharge: 2020-08-28 | Disposition: A | Discharge status: Home / Self Care | Payer: BC Managed Care – PPO | Attending: Neurosurgery | Admitting: Neurosurgery

## 2020-08-27 ENCOUNTER — Encounter (HOSPITAL_COMMUNITY): Payer: Self-pay | Admitting: Neurosurgery

## 2020-08-27 ENCOUNTER — Ambulatory Visit (HOSPITAL_COMMUNITY): Payer: BC Managed Care – PPO | Admitting: Anesthesiology

## 2020-08-27 ENCOUNTER — Encounter (HOSPITAL_COMMUNITY)
Admission: RE | Disposition: A | Discharge status: Home / Self Care | Payer: Self-pay | Source: Home / Self Care | Attending: Neurosurgery

## 2020-08-27 ENCOUNTER — Ambulatory Visit (HOSPITAL_COMMUNITY): Payer: BC Managed Care – PPO

## 2020-08-27 ENCOUNTER — Observation Stay (HOSPITAL_COMMUNITY): Payer: BC Managed Care – PPO

## 2020-08-27 ENCOUNTER — Other Ambulatory Visit: Payer: Self-pay

## 2020-08-27 DIAGNOSIS — I10 Essential (primary) hypertension: Secondary | ICD-10-CM | POA: Insufficient documentation

## 2020-08-27 DIAGNOSIS — R7303 Prediabetes: Secondary | ICD-10-CM | POA: Diagnosis not present

## 2020-08-27 DIAGNOSIS — Z7982 Long term (current) use of aspirin: Secondary | ICD-10-CM | POA: Insufficient documentation

## 2020-08-27 DIAGNOSIS — J45909 Unspecified asthma, uncomplicated: Secondary | ICD-10-CM | POA: Insufficient documentation

## 2020-08-27 DIAGNOSIS — M7138 Other bursal cyst, other site: Secondary | ICD-10-CM | POA: Diagnosis not present

## 2020-08-27 DIAGNOSIS — M5416 Radiculopathy, lumbar region: Secondary | ICD-10-CM | POA: Diagnosis not present

## 2020-08-27 DIAGNOSIS — Z419 Encounter for procedure for purposes other than remedying health state, unspecified: Secondary | ICD-10-CM

## 2020-08-27 DIAGNOSIS — Z87891 Personal history of nicotine dependence: Secondary | ICD-10-CM | POA: Diagnosis not present

## 2020-08-27 DIAGNOSIS — Z79899 Other long term (current) drug therapy: Secondary | ICD-10-CM | POA: Diagnosis not present

## 2020-08-27 DIAGNOSIS — M7130 Other bursal cyst, unspecified site: Secondary | ICD-10-CM | POA: Diagnosis present

## 2020-08-27 DIAGNOSIS — Z9889 Other specified postprocedural states: Secondary | ICD-10-CM | POA: Diagnosis not present

## 2020-08-27 DIAGNOSIS — M79604 Pain in right leg: Secondary | ICD-10-CM | POA: Diagnosis not present

## 2020-08-27 DIAGNOSIS — E559 Vitamin D deficiency, unspecified: Secondary | ICD-10-CM | POA: Diagnosis not present

## 2020-08-27 HISTORY — PX: LUMBAR LAMINECTOMY/DECOMPRESSION MICRODISCECTOMY: SHX5026

## 2020-08-27 SURGERY — LUMBAR LAMINECTOMY/DECOMPRESSION MICRODISCECTOMY 1 LEVEL
Anesthesia: General

## 2020-08-27 MED ORDER — FENTANYL CITRATE (PF) 250 MCG/5ML IJ SOLN
INTRAMUSCULAR | Status: DC | PRN
  Administered 2020-08-27: 100 ug via INTRAVENOUS
  Administered 2020-08-27: 150 ug via INTRAVENOUS

## 2020-08-27 MED ORDER — KETOROLAC TROMETHAMINE 30 MG/ML IJ SOLN
INTRAMUSCULAR | Status: AC
  Filled 2020-08-27: qty 1

## 2020-08-27 MED ORDER — CHLORTHALIDONE 25 MG PO TABS
25.0000 mg | ORAL_TABLET | Freq: Every day | ORAL | Status: DC
  Administered 2020-08-28: 25 mg via ORAL
  Filled 2020-08-27: qty 1

## 2020-08-27 MED ORDER — ACETAMINOPHEN 325 MG PO TABS
650.0000 mg | ORAL_TABLET | ORAL | Status: DC | PRN
  Administered 2020-08-28: 650 mg via ORAL
  Filled 2020-08-27: qty 2

## 2020-08-27 MED ORDER — PHENYLEPHRINE 40 MCG/ML (10ML) SYRINGE FOR IV PUSH (FOR BLOOD PRESSURE SUPPORT)
PREFILLED_SYRINGE | INTRAVENOUS | Status: AC
  Filled 2020-08-27: qty 10

## 2020-08-27 MED ORDER — MIDAZOLAM HCL 2 MG/2ML IJ SOLN
INTRAMUSCULAR | Status: AC
  Filled 2020-08-27: qty 2

## 2020-08-27 MED ORDER — OXYCODONE HCL 5 MG PO TABS: 5.0000 mg | ORAL_TABLET | ORAL | Status: DC | PRN

## 2020-08-27 MED ORDER — TRIAMCINOLONE ACETONIDE 0.025 % EX OINT: 1.0000 "application " | TOPICAL_OINTMENT | Freq: Two times a day (BID) | CUTANEOUS | Status: DC | PRN

## 2020-08-27 MED ORDER — FENTANYL CITRATE (PF) 100 MCG/2ML IJ SOLN
INTRAMUSCULAR | Status: AC
  Filled 2020-08-27: qty 2

## 2020-08-27 MED ORDER — DEXAMETHASONE SODIUM PHOSPHATE 10 MG/ML IJ SOLN
INTRAMUSCULAR | Status: AC
  Filled 2020-08-27: qty 1

## 2020-08-27 MED ORDER — THROMBIN 5000 UNITS EX SOLR
OROMUCOSAL | Status: DC | PRN
  Administered 2020-08-27: 5 mL via TOPICAL

## 2020-08-27 MED ORDER — MENTHOL 3 MG MT LOZG: 1.0000 | LOZENGE | OROMUCOSAL | Status: DC | PRN

## 2020-08-27 MED ORDER — KETOROLAC TROMETHAMINE 30 MG/ML IJ SOLN
30.0000 mg | Freq: Once | INTRAMUSCULAR | Status: AC | PRN
  Administered 2020-08-27: 30 mg via INTRAVENOUS

## 2020-08-27 MED ORDER — DEXAMETHASONE SODIUM PHOSPHATE 10 MG/ML IJ SOLN
INTRAMUSCULAR | Status: DC | PRN
  Administered 2020-08-27: 5 mg via INTRAVENOUS

## 2020-08-27 MED ORDER — SENNA 8.6 MG PO TABS
1.0000 | ORAL_TABLET | Freq: Two times a day (BID) | ORAL | Status: DC
  Administered 2020-08-27 – 2020-08-28 (×2): 8.6 mg via ORAL
  Filled 2020-08-27 (×2): qty 1

## 2020-08-27 MED ORDER — PHENOL 1.4 % MT LIQD: 1.0000 | OROMUCOSAL | Status: DC | PRN

## 2020-08-27 MED ORDER — LAMOTRIGINE 100 MG PO TABS
100.0000 mg | ORAL_TABLET | Freq: Two times a day (BID) | ORAL | Status: DC
  Administered 2020-08-27 – 2020-08-28 (×3): 100 mg via ORAL
  Filled 2020-08-27 (×4): qty 1

## 2020-08-27 MED ORDER — METOPROLOL SUCCINATE ER 25 MG PO TB24
25.0000 mg | ORAL_TABLET | Freq: Every day | ORAL | Status: DC
  Administered 2020-08-28: 25 mg via ORAL
  Filled 2020-08-27: qty 1

## 2020-08-27 MED ORDER — LIDOCAINE-EPINEPHRINE 1 %-1:100000 IJ SOLN
INTRAMUSCULAR | Status: AC
  Filled 2020-08-27: qty 1

## 2020-08-27 MED ORDER — FENTANYL CITRATE (PF) 100 MCG/2ML IJ SOLN
INTRAMUSCULAR | Status: DC | PRN
  Administered 2020-08-27: 100 ug via INTRAVENOUS

## 2020-08-27 MED ORDER — SODIUM CHLORIDE 0.9 % IV SOLN: INTRAVENOUS | Status: DC

## 2020-08-27 MED ORDER — CEFAZOLIN SODIUM-DEXTROSE 2-4 GM/100ML-% IV SOLN
2.0000 g | INTRAVENOUS | Status: AC
Start: 2020-08-27 — End: 2020-08-27
  Administered 2020-08-27: 2 g via INTRAVENOUS

## 2020-08-27 MED ORDER — ONDANSETRON HCL 4 MG/2ML IJ SOLN: 4.0000 mg | Freq: Four times a day (QID) | INTRAMUSCULAR | Status: DC | PRN

## 2020-08-27 MED ORDER — PHENYLEPHRINE HCL-NACL 10-0.9 MG/250ML-% IV SOLN
INTRAVENOUS | Status: DC | PRN
  Administered 2020-08-27: 20 ug/min via INTRAVENOUS

## 2020-08-27 MED ORDER — ALBUTEROL SULFATE HFA 108 (90 BASE) MCG/ACT IN AERS: 2.0000 | INHALATION_SPRAY | Freq: Four times a day (QID) | RESPIRATORY_TRACT | Status: DC | PRN

## 2020-08-27 MED ORDER — PROPOFOL 10 MG/ML IV BOLUS
INTRAVENOUS | Status: DC | PRN
  Administered 2020-08-27: 200 mg via INTRAVENOUS

## 2020-08-27 MED ORDER — SODIUM CHLORIDE 0.9% FLUSH: 3.0000 mL | INTRAVENOUS | Status: DC | PRN

## 2020-08-27 MED ORDER — ORAL CARE MOUTH RINSE: 15.0000 mL | Freq: Once | OROMUCOSAL | Status: AC

## 2020-08-27 MED ORDER — LIDOCAINE-EPINEPHRINE 1 %-1:100000 IJ SOLN
INTRAMUSCULAR | Status: DC | PRN
  Administered 2020-08-27: 5 mL

## 2020-08-27 MED ORDER — MIDAZOLAM HCL 2 MG/2ML IJ SOLN
INTRAMUSCULAR | Status: DC | PRN
Start: 2020-08-27 — End: 2020-08-27
  Administered 2020-08-27: 2 mg via INTRAVENOUS

## 2020-08-27 MED ORDER — ONDANSETRON HCL 4 MG/2ML IJ SOLN
INTRAMUSCULAR | Status: AC
  Filled 2020-08-27: qty 2

## 2020-08-27 MED ORDER — ACETAMINOPHEN 500 MG PO TABS
ORAL_TABLET | ORAL | Status: AC
  Administered 2020-08-27: 1000 mg via ORAL
  Filled 2020-08-27: qty 2

## 2020-08-27 MED ORDER — ROCURONIUM BROMIDE 10 MG/ML (PF) SYRINGE
PREFILLED_SYRINGE | INTRAVENOUS | Status: DC | PRN
  Administered 2020-08-27: 60 mg via INTRAVENOUS
  Administered 2020-08-27: 20 mg via INTRAVENOUS

## 2020-08-27 MED ORDER — DOCUSATE SODIUM 100 MG PO CAPS
100.0000 mg | ORAL_CAPSULE | Freq: Two times a day (BID) | ORAL | Status: DC
  Administered 2020-08-27 – 2020-08-28 (×2): 100 mg via ORAL
  Filled 2020-08-27 (×2): qty 1

## 2020-08-27 MED ORDER — ONDANSETRON HCL 4 MG PO TABS: 4.0000 mg | ORAL_TABLET | Freq: Four times a day (QID) | ORAL | Status: DC | PRN

## 2020-08-27 MED ORDER — METHYLPREDNISOLONE ACETATE 80 MG/ML IJ SUSP
INTRAMUSCULAR | Status: DC | PRN
  Administered 2020-08-27: 80 mg

## 2020-08-27 MED ORDER — BUPIVACAINE HCL (PF) 0.5 % IJ SOLN
INTRAMUSCULAR | Status: DC | PRN
  Administered 2020-08-27: 5 mL

## 2020-08-27 MED ORDER — CHLORHEXIDINE GLUCONATE CLOTH 2 % EX PADS: 6.0000 | MEDICATED_PAD | Freq: Once | CUTANEOUS | Status: DC

## 2020-08-27 MED ORDER — BUPIVACAINE HCL (PF) 0.5 % IJ SOLN
INTRAMUSCULAR | Status: AC
  Filled 2020-08-27: qty 30

## 2020-08-27 MED ORDER — AMISULPRIDE (ANTIEMETIC) 5 MG/2ML IV SOLN: 10.0000 mg | Freq: Once | INTRAVENOUS | Status: DC | PRN

## 2020-08-27 MED ORDER — CEFAZOLIN SODIUM-DEXTROSE 2-4 GM/100ML-% IV SOLN
2.0000 g | Freq: Three times a day (TID) | INTRAVENOUS | Status: AC
Start: 2020-08-27 — End: 2020-08-28
  Administered 2020-08-27 (×2): 2 g via INTRAVENOUS
  Filled 2020-08-27 (×2): qty 100

## 2020-08-27 MED ORDER — AMLODIPINE BESYLATE 5 MG PO TABS
5.0000 mg | ORAL_TABLET | Freq: Every day | ORAL | Status: DC
  Administered 2020-08-28: 5 mg via ORAL
  Filled 2020-08-27: qty 1

## 2020-08-27 MED ORDER — LACTATED RINGERS IV SOLN: INTRAVENOUS | Status: DC

## 2020-08-27 MED ORDER — THROMBIN 5000 UNITS EX SOLR
CUTANEOUS | Status: AC
  Filled 2020-08-27: qty 15000

## 2020-08-27 MED ORDER — METHOCARBAMOL 500 MG PO TABS
500.0000 mg | ORAL_TABLET | Freq: Four times a day (QID) | ORAL | Status: DC | PRN
  Administered 2020-08-27 – 2020-08-28 (×3): 500 mg via ORAL
  Filled 2020-08-27 (×3): qty 1

## 2020-08-27 MED ORDER — OXYCODONE HCL 5 MG/5ML PO SOLN: 5.0000 mg | Freq: Once | ORAL | Status: DC | PRN

## 2020-08-27 MED ORDER — CHLORHEXIDINE GLUCONATE 0.12 % MT SOLN
OROMUCOSAL | Status: AC
  Administered 2020-08-27: 15 mL via OROMUCOSAL
  Filled 2020-08-27: qty 15

## 2020-08-27 MED ORDER — LIDOCAINE 2% (20 MG/ML) 5 ML SYRINGE
INTRAMUSCULAR | Status: DC | PRN
  Administered 2020-08-27: 60 mg via INTRAVENOUS

## 2020-08-27 MED ORDER — MORPHINE SULFATE (PF) 2 MG/ML IV SOLN
2.0000 mg | INTRAVENOUS | Status: DC | PRN
Start: 2020-08-27 — End: 2020-08-28
  Administered 2020-08-27: 2 mg via INTRAVENOUS
  Filled 2020-08-27: qty 1

## 2020-08-27 MED ORDER — THROMBIN 5000 UNITS EX SOLR
CUTANEOUS | Status: DC | PRN
  Administered 2020-08-27 (×2): 5000 [IU] via TOPICAL

## 2020-08-27 MED ORDER — ACETAMINOPHEN 650 MG RE SUPP: 650.0000 mg | RECTAL | Status: DC | PRN

## 2020-08-27 MED ORDER — METHOCARBAMOL 1000 MG/10ML IJ SOLN
500.0000 mg | Freq: Four times a day (QID) | INTRAVENOUS | Status: DC | PRN
  Filled 2020-08-27 (×2): qty 5

## 2020-08-27 MED ORDER — ACETAMINOPHEN 500 MG PO TABS: 1000.0000 mg | ORAL_TABLET | Freq: Once | ORAL | Status: AC

## 2020-08-27 MED ORDER — TEMAZEPAM 15 MG PO CAPS: 30.0000 mg | ORAL_CAPSULE | Freq: Every evening | ORAL | Status: DC | PRN

## 2020-08-27 MED ORDER — PROMETHAZINE HCL 25 MG/ML IJ SOLN: 6.2500 mg | INTRAMUSCULAR | Status: DC | PRN

## 2020-08-27 MED ORDER — 0.9 % SODIUM CHLORIDE (POUR BTL) OPTIME
TOPICAL | Status: DC | PRN
  Administered 2020-08-27: 1000 mL

## 2020-08-27 MED ORDER — PHENYLEPHRINE 40 MCG/ML (10ML) SYRINGE FOR IV PUSH (FOR BLOOD PRESSURE SUPPORT)
PREFILLED_SYRINGE | INTRAVENOUS | Status: DC | PRN
  Administered 2020-08-27: 120 ug via INTRAVENOUS
  Administered 2020-08-27: 80 ug via INTRAVENOUS

## 2020-08-27 MED ORDER — CEFAZOLIN SODIUM-DEXTROSE 2-4 GM/100ML-% IV SOLN
INTRAVENOUS | Status: AC
  Filled 2020-08-27: qty 100

## 2020-08-27 MED ORDER — OXYCODONE HCL 5 MG PO TABS
10.0000 mg | ORAL_TABLET | ORAL | Status: DC | PRN
Start: 2020-08-27 — End: 2020-08-28
  Administered 2020-08-27 – 2020-08-28 (×6): 10 mg via ORAL
  Filled 2020-08-27 (×6): qty 2

## 2020-08-27 MED ORDER — PANTOPRAZOLE SODIUM 40 MG IV SOLR: 40.0000 mg | Freq: Every day | INTRAVENOUS | Status: DC

## 2020-08-27 MED ORDER — SODIUM CHLORIDE 0.9% FLUSH
3.0000 mL | Freq: Two times a day (BID) | INTRAVENOUS | Status: DC
  Administered 2020-08-27 (×2): 3 mL via INTRAVENOUS

## 2020-08-27 MED ORDER — CHLORHEXIDINE GLUCONATE 0.12 % MT SOLN: 15.0000 mL | Freq: Once | OROMUCOSAL | Status: AC

## 2020-08-27 MED ORDER — ONDANSETRON HCL 4 MG/2ML IJ SOLN
INTRAMUSCULAR | Status: DC | PRN
  Administered 2020-08-27: 4 mg via INTRAVENOUS

## 2020-08-27 MED ORDER — SODIUM CHLORIDE 0.9 % IV SOLN: 250.0000 mL | INTRAVENOUS | Status: DC

## 2020-08-27 MED ORDER — METHYLPREDNISOLONE ACETATE 80 MG/ML IJ SUSP
INTRAMUSCULAR | Status: AC
  Filled 2020-08-27: qty 1

## 2020-08-27 MED ORDER — OXYCODONE HCL 5 MG PO TABS: 5.0000 mg | ORAL_TABLET | Freq: Once | ORAL | Status: DC | PRN

## 2020-08-27 MED ORDER — PROPOFOL 10 MG/ML IV BOLUS
INTRAVENOUS | Status: AC
  Filled 2020-08-27: qty 20

## 2020-08-27 MED ORDER — BISACODYL 10 MG RE SUPP: 10.0000 mg | Freq: Every day | RECTAL | Status: DC | PRN

## 2020-08-27 MED ORDER — PANTOPRAZOLE SODIUM 40 MG PO TBEC
40.0000 mg | DELAYED_RELEASE_TABLET | Freq: Every day | ORAL | Status: DC
  Administered 2020-08-28: 40 mg via ORAL
  Filled 2020-08-27: qty 1

## 2020-08-27 MED ORDER — FENTANYL CITRATE (PF) 100 MCG/2ML IJ SOLN
25.0000 ug | INTRAMUSCULAR | Status: DC | PRN
  Administered 2020-08-27 (×3): 50 ug via INTRAVENOUS

## 2020-08-27 MED ORDER — LIDOCAINE 2% (20 MG/ML) 5 ML SYRINGE
INTRAMUSCULAR | Status: AC
  Filled 2020-08-27: qty 5

## 2020-08-27 MED ORDER — ROCURONIUM BROMIDE 10 MG/ML (PF) SYRINGE
PREFILLED_SYRINGE | INTRAVENOUS | Status: AC
  Filled 2020-08-27: qty 10

## 2020-08-27 MED ORDER — HEMOSTATIC AGENTS (NO CHARGE) OPTIME
TOPICAL | Status: DC | PRN
  Administered 2020-08-27: 1 via TOPICAL

## 2020-08-27 MED ORDER — ACETAMINOPHEN 500 MG PO TABS
1000.0000 mg | ORAL_TABLET | Freq: Four times a day (QID) | ORAL | Status: AC
  Administered 2020-08-27 – 2020-08-28 (×4): 1000 mg via ORAL
  Filled 2020-08-27 (×4): qty 2

## 2020-08-27 MED ORDER — SUGAMMADEX SODIUM 200 MG/2ML IV SOLN
INTRAVENOUS | Status: DC | PRN
  Administered 2020-08-27: 200 mg via INTRAVENOUS

## 2020-08-27 MED ORDER — CYCLOBENZAPRINE HCL 10 MG PO TABS
10.0000 mg | ORAL_TABLET | Freq: Every evening | ORAL | Status: DC | PRN
  Administered 2020-08-27: 10 mg via ORAL
  Filled 2020-08-27: qty 1

## 2020-08-27 MED ORDER — FENTANYL CITRATE (PF) 250 MCG/5ML IJ SOLN
INTRAMUSCULAR | Status: AC
  Filled 2020-08-27: qty 5

## 2020-08-27 SURGICAL SUPPLY — 56 items
BAG COUNTER SPONGE SURGICOUNT (BAG) ×4 IMPLANT
BAND RUBBER #18 3X1/16 STRL (MISCELLANEOUS) ×4 IMPLANT
BENZOIN TINCTURE PRP APPL 2/3 (GAUZE/BANDAGES/DRESSINGS) ×2 IMPLANT
BLADE CLIPPER SURG (BLADE) IMPLANT
BLADE SURG 11 STRL SS (BLADE) ×2 IMPLANT
BUR MATCHSTICK NEURO 3.0 LAGG (BURR) ×2 IMPLANT
BUR PRECISION FLUTE 5.0 (BURR) IMPLANT
CANISTER SUCT 3000ML PPV (MISCELLANEOUS) ×2 IMPLANT
CARTRIDGE OIL MAESTRO DRILL (MISCELLANEOUS) ×1 IMPLANT
DECANTER SPIKE VIAL GLASS SM (MISCELLANEOUS) ×2 IMPLANT
DERMABOND ADVANCED (GAUZE/BANDAGES/DRESSINGS) ×1
DERMABOND ADVANCED .7 DNX12 (GAUZE/BANDAGES/DRESSINGS) ×1 IMPLANT
DIFFUSER DRILL AIR PNEUMATIC (MISCELLANEOUS) ×2 IMPLANT
DRAPE LAPAROTOMY 100X72X124 (DRAPES) ×2 IMPLANT
DRAPE MICROSCOPE LEICA (MISCELLANEOUS) ×2 IMPLANT
DRAPE SURG 17X23 STRL (DRAPES) ×2 IMPLANT
DRSG OPSITE POSTOP 3X4 (GAUZE/BANDAGES/DRESSINGS) IMPLANT
DRSG OPSITE POSTOP 4X6 (GAUZE/BANDAGES/DRESSINGS) ×2 IMPLANT
DURAPREP 26ML APPLICATOR (WOUND CARE) ×2 IMPLANT
ELECT REM PT RETURN 9FT ADLT (ELECTROSURGICAL) ×2
ELECTRODE REM PT RTRN 9FT ADLT (ELECTROSURGICAL) ×1 IMPLANT
GAUZE 4X4 16PLY ~~LOC~~+RFID DBL (SPONGE) ×2 IMPLANT
GAUZE SPONGE 4X4 12PLY STRL (GAUZE/BANDAGES/DRESSINGS) IMPLANT
GLOVE EXAM NITRILE XL STR (GLOVE) IMPLANT
GLOVE SRG 8 PF TXTR STRL LF DI (GLOVE) ×1 IMPLANT
GLOVE SURG ENC MOIS LTX SZ7.5 (GLOVE) ×2 IMPLANT
GLOVE SURG LTX SZ7 (GLOVE) ×2 IMPLANT
GLOVE SURG LTX SZ8 (GLOVE) ×2 IMPLANT
GLOVE SURG UNDER POLY LF SZ7.5 (GLOVE) ×6 IMPLANT
GLOVE SURG UNDER POLY LF SZ8 (GLOVE) ×2
GOWN STRL REUS W/ TWL LRG LVL3 (GOWN DISPOSABLE) ×1 IMPLANT
GOWN STRL REUS W/ TWL XL LVL3 (GOWN DISPOSABLE) ×1 IMPLANT
GOWN STRL REUS W/TWL 2XL LVL3 (GOWN DISPOSABLE) ×4 IMPLANT
GOWN STRL REUS W/TWL LRG LVL3 (GOWN DISPOSABLE) ×2
GOWN STRL REUS W/TWL XL LVL3 (GOWN DISPOSABLE) ×2
HEMOSTAT POWDER KIT SURGIFOAM (HEMOSTASIS) ×2 IMPLANT
KIT BASIN OR (CUSTOM PROCEDURE TRAY) ×2 IMPLANT
KIT TURNOVER KIT B (KITS) ×2 IMPLANT
NEEDLE HYPO 18GX1.5 BLUNT FILL (NEEDLE) ×2 IMPLANT
NEEDLE HYPO 22GX1.5 SAFETY (NEEDLE) ×2 IMPLANT
NEEDLE SPNL 18GX3.5 QUINCKE PK (NEEDLE) ×2 IMPLANT
NS IRRIG 1000ML POUR BTL (IV SOLUTION) ×2 IMPLANT
OIL CARTRIDGE MAESTRO DRILL (MISCELLANEOUS) ×2
PACK LAMINECTOMY NEURO (CUSTOM PROCEDURE TRAY) ×2 IMPLANT
PAD ARMBOARD 7.5X6 YLW CONV (MISCELLANEOUS) ×6 IMPLANT
SPONGE SURGIFOAM ABS GEL SZ50 (HEMOSTASIS) ×2 IMPLANT
SPONGE T-LAP 4X18 ~~LOC~~+RFID (SPONGE) ×2 IMPLANT
STRIP CLOSURE SKIN 1/2X4 (GAUZE/BANDAGES/DRESSINGS) ×2 IMPLANT
SUT VIC AB 0 CT1 18XCR BRD8 (SUTURE) ×1 IMPLANT
SUT VIC AB 0 CT1 8-18 (SUTURE) ×2
SUT VIC AB 2-0 CT1 18 (SUTURE) IMPLANT
SUT VICRYL 3-0 RB1 18 ABS (SUTURE) ×4 IMPLANT
SYR 3ML LL SCALE MARK (SYRINGE) ×2 IMPLANT
TOWEL GREEN STERILE (TOWEL DISPOSABLE) ×2 IMPLANT
TOWEL GREEN STERILE FF (TOWEL DISPOSABLE) ×2 IMPLANT
WATER STERILE IRR 1000ML POUR (IV SOLUTION) ×2 IMPLANT

## 2020-08-27 NOTE — Anesthesia Procedure Notes (Signed)
Procedure Name: Intubation Date/Time: 08/27/2020 7:59 AM Performed by: Thelma Comp, CRNA Pre-anesthesia Checklist: Patient identified, Emergency Drugs available, Suction available and Patient being monitored Patient Re-evaluated:Patient Re-evaluated prior to induction Oxygen Delivery Method: Circle System Utilized Preoxygenation: Pre-oxygenation with 100% oxygen Induction Type: IV induction Ventilation: Mask ventilation without difficulty Laryngoscope Size: Mac and 4 Grade View: Grade II Tube type: Oral Number of attempts: 1 Airway Equipment and Method: Stylet Placement Confirmation: ETT inserted through vocal cords under direct vision, positive ETCO2 and breath sounds checked- equal and bilateral Secured at: 23 cm Tube secured with: Tape Dental Injury: Teeth and Oropharynx as per pre-operative assessment

## 2020-08-27 NOTE — Op Note (Signed)
  NEUROSURGERY OPERATIVE NOTE   PREOP DIAGNOSIS:  Right lumbar radiculopathy Right L5-S1 synovial cyst   POSTOP DIAGNOSIS: Same  PROCEDURE: Right extraforaminal approach, resection of synovial cyst  SURGEON: Dr. Consuella Lose, MD  ASSISTANT: Dr. Pieter Partridge Dawley  ANESTHESIA: General Endotracheal  EBL: 150cc  SPECIMENS: None  DRAINS: None  COMPLICATIONS: None immediate  CONDITION: Hemodynamically stable to PACU  HISTORY: Bryan Rodgers is a 60 y.o. male initially seen in the outpatient neurosurgery clinic with severe exacerbation of chronic right-sided leg pain.  Imaging revealed a foraminal synovial cyst on the right at L5-S1.  Patient failed reasonable medical treatment and elected to proceed with surgical decompression.  The risks, benefits, and alternatives to surgery were reviewed in detail with the patient.  After all his questions were answered informed consent was obtained and witnessed.  PROCEDURE IN DETAIL: The patient was brought to the operating room. After induction of general anesthesia, the patient was positioned on the operative table in the prone position. All pressure points were meticulously padded. Skin incision was then marked out and prepped and draped in the usual sterile fashion.  After timeout was conducted, spinal needle was introduced to identify the surface projection of the L5-S1 interspace.  Midline skin incision was then infiltrated with local anesthetic with epinephrine.  Incision was then made sharply and carried down through the lumbodorsal fascia.  Subperiosteal dissection was carried out on the right side of the L5 lamina.  A dissector was placed in the L5-S1 interspace and another x-ray was taken to confirm our location at the correct level.  Dissection was then carried laterally to identify the pars of L5.  Self-retaining tractor was then placed.  Microscope was then draped sterilely and brought into the field and the remainder of the case was  done under the microscope using microdissection technique.  The lateral aspect of the pars and the superior aspect of the L5-S1 facet complex was drilled out.  The L5 pedicle was identified.  After removal of the pars, I identified the synovial cyst in its superior and lateral aspect.  Using a combination of Kerrison punches, the lateral portion of the pars was further removed.  This allowed dissection of the synovial cyst in its medial aspect.  Pituitary rongeur was then used to remove the cyst.  The exiting right L5 nerve root then came into view.  I was then able to easily pass a blunt dissector both proximally and distally indicating good decompression.  At this point hemostasis was secured with morselized Gelfoam with thrombin.  The nerve root was then covered with Depo-Medrol mixed with fentanyl.  Self-retaining retractor was then removed.  Hemostasis on the muscular edges was achieved with bipolar electrocautery.  The lumbodorsal fascia was then closed with interrupted 0 Vicryl stitches.  Deep subcutaneous layer was closed with 0 Vicryl stitches and skin was closed with interrupted 3-0 Vicryl.  Layer of Dermabond was placed.  At the end of the case all sponge, needle, and instrument counts were correct. The patient was then transferred to the stretcher, extubated, and taken to the post-anesthesia care unit in stable hemodynamic condition.   Consuella Lose, MD [facility]

## 2020-08-27 NOTE — H&P (Signed)
Chief Complaint   Right leg pain  History of Present Illness  Bryan Rodgers is a 60 y.o. male initially seen in the outpatient neurosurgery clinic for evaluation of relatively chronic right-sided posterior hip and leg pain.  Symptoms started several years ago with significant exacerbation over the last several months.  Patient describes pain in the posterolateral right hip with radiation through the right lateral aspect of the thigh, lateral calf, and some symptoms in the top of his right foot.  Patient is unable to stand for any length of time.  He does not have any left leg symptoms, minimal back pain, and no change in bladder function.  Patient attempted some medical treatment without significant improvement.  He presents today for surgical decompression.  Past Medical History   Past Medical History:  Diagnosis Date   Alcoholic ([place]) quit [id number]   10-15 beers per day on the weekends    Anemia    Arthritis    Asthma    childhood asthma up to age 21   Chest pain    Palpations   Dyspnea    Elevated CPK    GERD (gastroesophageal reflux disease)    Hallucination    Headache    Hemorrhoids    with bleeding    History of colonic polyps    removed via colonscope   Hypertension    Kidney stone    Migraine    Neuromuscular disorder (HCC)    Overweight (BMI 25.0-29.9)    Pneumonia    Prediabetes 10/2012   HBA1C is 6.4   Tobacco abuse     Past Surgical History   Past Surgical History:  Procedure Laterality Date   CATARACT EXTRACTION W/ INTRAOCULAR LENS IMPLANT Bilateral 2012   COLONOSCOPY  2007   Dr. Oneida Alar: internal hemorrhoids, otherwise normal   COLONOSCOPY  2004   reported history of colon polyp   COLONOSCOPY WITH PROPOFOL N/A 07/26/2016   Procedure: COLONOSCOPY WITH PROPOFOL;  Surgeon: Danie Binder, MD;  Location: [facility];  Service: Endoscopy;  Laterality: N/A;  830    hemmorrhoidectomy  2002   POLYPECTOMY  07/26/2016   Procedure: POLYPECTOMY;  Surgeon:  Danie Binder, MD;  Location: [facility] SU[facility];  Service: Endoscopy;;  splenic flexure polyps x4    Social History   Social History   Tobacco Use   Smoking status: Former    Types: Cigarettes    Quit date: 03/16/2012    Years since quitting: 8.4   Smokeless tobacco: Never  Vaping Use   Vaping Use: Never used  Substance Use Topics   Alcohol use: No    Alcohol/week: 0.0 standard drinks    Comment: History of alcohol use with beer on weekends, none since 2014    Drug use: No    Medications   Prior to Admission medications   Medication Sig Start Date End Date Taking? Authorizing Provider  amLODipine (NORVASC) 5 MG tablet Take 1 tablet (5 mg total) by mouth daily. 08/11/20  Yes Fayrene Helper, MD  aspirin EC 81 MG tablet Take 81 mg by mouth at bedtime.   Yes [provider]  chlorthalidone (HYGROTON) 25 MG tablet Take 1 tablet (25 mg total) by mouth daily. 08/11/20  Yes Fayrene Helper, MD  cyclobenzaprine (FLEXERIL) 10 MG tablet TAKE 1 TABLET ONCE DAILY AT BEDTIME AS NEEDED FOR MUSCLE SPASM Patient taking differently: Take 10 mg by mouth at bedtime as needed for muscle spasms. 07/27/20  Yes Sanjuana Kava, MD  lamoTRIgine (LAMICTAL) 100 MG tablet Take 1 tablet (100 mg total) by mouth 2 (two) times daily. 01/07/20  Yes Marcial Pacas, MD  metoprolol succinate (TOPROL-XL) 25 MG 24 hr tablet TAKE 1 TABLET BY MOUTH ONCE DAILY. Patient taking differently: Take 25 mg by mouth daily. 11/26/19  Yes Fayrene Helper, MD  omeprazole (PRILOSEC) 40 MG capsule TAKE (1) CAPSULE BY MOUTH ONCE DAILY. Patient taking differently: Take 40 mg by mouth daily. 08/17/20  Yes Fayrene Helper, MD  temazepam (RESTORIL) 30 MG capsule Take 1 capsule (30 mg total) by mouth at bedtime as needed for sleep. 08/11/20  Yes Fayrene Helper, MD  triamcinolone (KENALOG) 0.025 % ointment APPLY SPARINGLY TWICE DAILY TO AFFECTED AREA FOR 1 WEEK, THEN AS DIRECTED. Patient taking differently: Apply 1  application topically 2 (two) times daily as needed (eczema/rashes). 11/12/18  Yes Fayrene Helper, MD  carbamide peroxide (DEBROX) 6.5 % OTIC solution Place 5 drops into both ears 2 (two) times daily. Patient not taking: Reported on 08/25/2020 07/24/20   Lindell Spar, MD  gabapentin (NEURONTIN) 100 MG capsule Take 1 capsule (100 mg total) by mouth 2 (two) times daily. Patient not taking: Reported on 08/25/2020 06/11/20   Lindell Spar, MD  simvastatin (ZOCOR) 10 MG tablet Take 1 tablet (10 mg total) by mouth at bedtime. Patient not taking: Reported on 08/25/2020 01/18/18   Fayrene Helper, MD  VENTOLIN HFA 108 [phone] Base) MCG/ACT inhaler INHALE 2 PUFFS BY MOUTH EVERY 6 HOURS AS NEEDED FOR WHEEZING OR SHORTNESS OF BREATH Patient taking differently: Inhale 2 puffs into the lungs every 6 (six) hours as needed for wheezing or shortness of breath. 07/01/20   Fayrene Helper, MD    Allergies  No Known Allergies  Review of Systems  ROS  Neurologic Exam  Awake, alert, oriented Memory and concentration grossly intact Speech fluent, appropriate CN grossly intact Motor exam: Upper Extremities Deltoid Bicep Tricep Grip  Right 5/5 5/5 5/5 5/5  Left 5/5 5/5 5/5 5/5   Lower Extremities IP Quad PF DF EHL  Right 5/5 5/5 5/5 5/5 5/5  Left 5/5 5/5 5/5 5/5 5/5   Sensation grossly intact to LT  Imaging  MRI of the lumbar spine dated 07/24/2020 was reviewed and demonstrates a synovial cyst on the right at L5-S1 which extends into the lateral aspect of the right foramen and likely compresses the right L5 nerve root.  Impression  - 60 y.o. male with right-sided lumbar radiculopathy likely related to lateral synovial cyst at L5-S1 with compression of the L5 nerve root.  Plan  -We will plan on proceeding with right-sided extraforaminal decompression and resection of synovial cyst at L5-S1  I have reviewed the indications for surgery as well as the associated risks, benefits, and alternatives.   We have discussed the expected postoperative course and recovery.  Patient appeared to understand our discussion and does wish to proceed.  All his questions were answered.  He provided informed consent.  Consuella Lose, MD [facility]

## 2020-08-27 NOTE — Transfer of Care (Signed)
Immediate Anesthesia Transfer of Care Note  Patient: Bryan Rodgers  Procedure(s) Performed: EXTRAFORAMINAL DECOMPRESSION RIGHT LUMBAR FIVE-SACRAL ONE, RESECT SYNOVIAL CYST  Patient Location: PACU  Anesthesia Type:General  Level of Consciousness: drowsy and patient cooperative  Airway & Oxygen Therapy: Patient Spontanous Breathing and Patient connected to face mask oxygen  Post-op Assessment: Report given to RN and Post -op Vital signs reviewed and stable  Post vital signs: Reviewed and stable  Last Vitals:  Vitals Value Taken Time  BP 136/89 08/27/20 0956  Temp    Pulse 98 08/27/20 0958  Resp 18 08/27/20 0958  SpO2 100 % 08/27/20 0958  Vitals shown include unvalidated device data.  Last Pain:  Vitals:   08/27/20 0601  TempSrc: Oral  PainSc: 8       Patients Stated Pain Goal: 2 ([id number])  Complications: No notable events documented.

## 2020-08-27 NOTE — Anesthesia Postprocedure Evaluation (Signed)
Anesthesia Post Note  Patient: Bryan Rodgers  Procedure(s) Performed: EXTRAFORAMINAL DECOMPRESSION RIGHT LUMBAR FIVE-SACRAL ONE, RESECT SYNOVIAL CYST     Patient location during evaluation: PACU Anesthesia Type: General Level of consciousness: awake Pain management: pain level controlled Vital Signs Assessment: post-procedure vital signs reviewed and stable Respiratory status: spontaneous breathing, nonlabored ventilation, respiratory function stable and patient connected to nasal cannula oxygen Cardiovascular status: blood pressure returned to baseline and stable Postop Assessment: no apparent nausea or vomiting Anesthetic complications: no   No notable events documented.  Last Vitals:  Vitals:   08/27/20 1142 08/27/20 1207  BP: 111/74 (!) 147/80  Pulse: 99 93  Resp: 15 17  Temp: 36.4 C (!) 36.4 C  SpO2: 100% 98%    Last Pain:  Vitals:   08/27/20 1207  TempSrc: Oral  PainSc:                  Karyl Kinnier Emillie Chasen

## 2020-08-28 ENCOUNTER — Encounter (HOSPITAL_COMMUNITY): Payer: Self-pay | Admitting: Neurosurgery

## 2020-08-28 DIAGNOSIS — J45909 Unspecified asthma, uncomplicated: Secondary | ICD-10-CM | POA: Diagnosis not present

## 2020-08-28 DIAGNOSIS — Z79899 Other long term (current) drug therapy: Secondary | ICD-10-CM | POA: Diagnosis not present

## 2020-08-28 DIAGNOSIS — M7138 Other bursal cyst, other site: Secondary | ICD-10-CM | POA: Diagnosis not present

## 2020-08-28 DIAGNOSIS — Z7982 Long term (current) use of aspirin: Secondary | ICD-10-CM | POA: Diagnosis not present

## 2020-08-28 DIAGNOSIS — M5416 Radiculopathy, lumbar region: Secondary | ICD-10-CM | POA: Diagnosis not present

## 2020-08-28 DIAGNOSIS — I10 Essential (primary) hypertension: Secondary | ICD-10-CM | POA: Diagnosis not present

## 2020-08-28 DIAGNOSIS — Z87891 Personal history of nicotine dependence: Secondary | ICD-10-CM | POA: Diagnosis not present

## 2020-08-28 DIAGNOSIS — M79604 Pain in right leg: Secondary | ICD-10-CM | POA: Diagnosis not present

## 2020-08-28 DIAGNOSIS — R7303 Prediabetes: Secondary | ICD-10-CM | POA: Diagnosis not present

## 2020-08-28 LAB — BASIC METABOLIC PANEL
Anion gap: 9 (ref 5–15)
BUN: 19 mg/dL (ref 8–23)
CO2: 23 mmol/L (ref 22–32)
Calcium: 9.3 mg/dL (ref 8.9–10.3)
Chloride: 103 mmol/L (ref 98–111)
Creatinine, Ser: 1.16 mg/dL (ref 0.61–1.24)
GFR, Estimated: >60 mL/min (ref >60)
Glucose, Bld: 130 mg/dL — ABNORMAL HIGH (ref 70–99)
Potassium: 5.6 mmol/L — ABNORMAL HIGH (ref 3.5–5.1)
Sodium: 135 mmol/L (ref 135–145)

## 2020-08-28 LAB — CBC
HCT: 40.9 % (ref 39.0–52.0)
Hemoglobin: 13.6 g/dL (ref 13.0–17.0)
MCH: 29.2 pg (ref 26.0–34.0)
MCHC: 33.3 g/dL (ref 30.0–36.0)
MCV: 87.8 fL (ref 80.0–100.0)
Platelets: 205 10*3/uL (ref 150–400)
RBC: 4.66 MIL/uL (ref 4.22–5.81)
RDW: 14.5 % (ref 11.5–15.5)
WBC: 11.7 10*3/uL — ABNORMAL HIGH (ref 4.0–10.5)
nRBC: 0 % (ref 0.0–0.2)

## 2020-08-28 MED ORDER — ASPIRIN EC 81 MG PO TBEC: 81.0000 mg | DELAYED_RELEASE_TABLET | Freq: Every day | ORAL | 11 refills | Status: AC

## 2020-08-28 MED ORDER — TRIAMCINOLONE ACETONIDE 0.025 % EX OINT: 1.0000 "application " | TOPICAL_OINTMENT | Freq: Two times a day (BID) | CUTANEOUS | Status: DC | PRN

## 2020-08-28 MED ORDER — OXYCODONE HCL 10 MG PO TABS: 10.0000 mg | ORAL_TABLET | Freq: Four times a day (QID) | ORAL | 0 refills | Status: AC | PRN

## 2020-08-28 MED ORDER — METOPROLOL SUCCINATE ER 25 MG PO TB24: 25.0000 mg | ORAL_TABLET | Freq: Every day | ORAL | Status: DC

## 2020-08-28 MED ORDER — ALBUTEROL SULFATE HFA 108 (90 BASE) MCG/ACT IN AERS: 2.0000 | INHALATION_SPRAY | Freq: Four times a day (QID) | RESPIRATORY_TRACT | Status: DC | PRN

## 2020-08-28 MED ORDER — OMEPRAZOLE 40 MG PO CPDR: 40.0000 mg | DELAYED_RELEASE_CAPSULE | Freq: Every day | ORAL | Status: DC

## 2020-08-28 NOTE — Progress Notes (Signed)
PT Cancellation Note and Discharge  Patient Details Name: Bryan Rodgers MRN: [id number] DOB: 04/26/1960   Cancelled Treatment:    Reason Eval/Treat Not Completed: PT screened, no needs identified, will sign off. Discussed pt case with OT who reports that pt is currently functioning at a modified independent to independent level with occasional use of RW. Education completed by OT. Will sign off at this time. If needs change, please reconsult.    Thelma Comp 08/28/2020, 10:25 AM  Rolinda Roan, PT, DPT Acute Rehabilitation Services Pager: [phone] Office: [phone]

## 2020-08-28 NOTE — Plan of Care (Signed)
Adequately Ready for Discharge 

## 2020-08-28 NOTE — Progress Notes (Signed)
Pt. discharged home accompanied by spouse.. Prescriptions and discharge instructions given with verbalization of understanding. Incision site on back with no s/s of infection - no swelling, redness, bleeding, and/or drainage noted. Opportunity given to ask questions but no question asked. Pt. transported out of this unit in wheelchair by the volunteer

## 2020-08-28 NOTE — Discharge Summary (Signed)
Physician Discharge Summary  Patient ID: Bryan Rodgers MRN: [id number] DOB/AGE: 1960-06-21 60 y.o.  Admit date: 08/27/2020 Discharge date: 08/28/2020  Admission Diagnoses:  Synovial cyst  Discharge Diagnoses:  Same Active Problems:   Synovial cyst   Discharged Condition: Stable  Hospital Course:  Bryan Rodgers is a 60 y.o. male admitted after elective right L5-S1 synovial cyst resection. He was doing well postop with improvement in leg pain. He was ambulating well, tolerating diet, with pain under control.  Treatments: Surgery - Right extraforaminal L5-S1 resection of synovial cyst  Discharge Exam: Blood pressure (!) 144/83, pulse 98, temperature (!) 97.5 F (36.4 C), temperature source Oral, resp. rate 17, height '5\' 10"'$  (1.778 m), weight 95.3 kg, SpO2 97 %. Awake, alert, oriented Speech fluent, appropriate CN grossly intact 5/5 BUE/BLE Wound c/d/i  Disposition: Discharge disposition: 01-Home or Self Care       Discharge Instructions     Call MD for:  redness, tenderness, or signs of infection (pain, swelling, redness, odor or green/yellow discharge around incision site)   Complete by: As directed    Call MD for:  temperature >100.4   Complete by: As directed    Diet - low sodium heart healthy   Complete by: As directed    Discharge instructions   Complete by: As directed    Walk at home as much as possible, at least 4 times / day   Increase activity slowly   Complete by: As directed    Lifting restrictions   Complete by: As directed    No lifting > 10 lbs   May shower / Bathe   Complete by: As directed    48 hours after surgery   May walk up steps   Complete by: As directed    Other Restrictions   Complete by: As directed    No bending/twisting at waist   Remove dressing in 24 hours   Complete by: As directed       Allergies as of 08/28/2020   No Known Allergies      Medication List     STOP taking these medications    Debrox 6.5 %  OTIC solution Generic drug: carbamide peroxide   gabapentin 100 MG capsule Commonly known as: NEURONTIN       TAKE these medications    albuterol 108 (90 Base) MCG/ACT inhaler Commonly known as: Ventolin HFA Inhale 2 puffs into the lungs every 6 (six) hours as needed for wheezing or shortness of breath.   amLODipine 5 MG tablet Commonly known as: NORVASC Take 1 tablet (5 mg total) by mouth daily.   aspirin EC 81 MG tablet Take 1 tablet (81 mg total) by mouth at bedtime. Start taking on: September 01, 2020 What changed: These instructions start on September 01, 2020. If you are unsure what to do until then, ask your doctor or other care provider.   chlorthalidone 25 MG tablet Commonly known as: HYGROTON Take 1 tablet (25 mg total) by mouth daily.   cyclobenzaprine 10 MG tablet Commonly known as: FLEXERIL TAKE 1 TABLET ONCE DAILY AT BEDTIME AS NEEDED FOR MUSCLE SPASM What changed:  how much to take how to take this when to take this reasons to take this additional instructions   lamoTRIgine 100 MG tablet Commonly known as: LaMICtal Take 1 tablet (100 mg total) by mouth 2 (two) times daily.   metoprolol succinate 25 MG 24 hr tablet Commonly known as: TOPROL-XL Take 1 tablet (25 mg total) by  mouth daily.   omeprazole 40 MG capsule Commonly known as: PRILOSEC Take 1 capsule (40 mg total) by mouth daily. What changed: See the new instructions.   Oxycodone HCl 10 MG Tabs Take 1 tablet (10 mg total) by mouth every 6 (six) hours as needed for up to 7 days for severe pain ((score 7 to 10)).   temazepam 30 MG capsule Commonly known as: RESTORIL Take 1 capsule (30 mg total) by mouth at bedtime as needed for sleep.   triamcinolone 0.025 % ointment Commonly known as: KENALOG Apply 1 application topically 2 (two) times daily as needed (eczema/rashes).       ASK your doctor about these medications    simvastatin 10 MG tablet Commonly known as: ZOCOR Take 1 tablet (10 mg  total) by mouth at bedtime.        Follow-up Information     Consuella Lose, MD Follow up.   Specialty: Neurosurgery Why: Clinical Follow-up, For wound re-check Contact information: [address] [place] [address] [phone]                 Signed: [name] 08/28/2020, 9:38 AM

## 2020-08-28 NOTE — Evaluation (Signed)
Occupational Therapy Evaluation Patient Details Name: Bryan Rodgers MRN: [id number] DOB: [birth year] Today's Date: 08/28/2020    History of Present Illness 61 yo male s/p decompression L5-S1 and resect synovial cyst. PMH including arthritis, HTN, and neuromuscular disorder.   Clinical Impression   PTA, pt was living with his wife and was independent; recently using RW as needed for pain. Currently, pt performing ADLs and functional mobility at Mod I level. Provided education and handout on back precautions, grooming, LB ADLs, toileting, and shower transfer with shower seat; pt demonstrated understanding. Answered all pt questions. Recommend dc home once medically stable per physician. All acute OT needs met and will sign off. Thank you.     Follow Up Recommendations  No OT follow up;Supervision - Intermittent    Equipment Recommendations  Other (comment) (RW)    Recommendations for Other Services       Precautions / Restrictions Precautions Precautions: Back Precaution Booklet Issued: Yes (comment) Required Braces or Orthoses: Other Brace (NO brace) Restrictions Weight Bearing Restrictions: No      Mobility Bed Mobility               General bed mobility comments: Education on log roll technique    Transfers Overall transfer level: Modified independent                    Balance Overall balance assessment: No apparent balance deficits (not formally assessed)                                         ADL either performed or assessed with clinical judgement   ADL Overall ADL's : Modified independent                                       General ADL Comments: Increased time as needed     Vision         Perception     Praxis      Pertinent Vitals/Pain Pain Assessment: No/denies pain     Hand Dominance Right   Extremity/Trunk Assessment Upper Extremity Assessment Upper Extremity Assessment: Overall WFL for  tasks assessed   Lower Extremity Assessment Lower Extremity Assessment: Defer to PT evaluation   Cervical / Trunk Assessment Cervical / Trunk Assessment: Other exceptions Cervical / Trunk Exceptions: s/p lumbar cyst removal   Communication Communication Communication: No difficulties   Cognition Arousal/Alertness: Awake/alert Behavior During Therapy: WFL for tasks assessed/performed Overall Cognitive Status: Within Functional Limits for tasks assessed                                     General Comments       Exercises     Shoulder Instructions      Home Living Family/patient expects to be discharged to:: Private residence Living Arrangements: Spouse/significant other;Children Available Help at Discharge: Family;Available 24 hours/day Type of Home: House Home Access: Ramped entrance     Home Layout: One level     Bathroom Shower/Tub: Occupational psychologist: Handicapped height     Home Equipment: Shower seat - built in          Prior Functioning/Environment Level of Independence: Independent  OT Problem List: Decreased activity tolerance;Decreased knowledge of precautions;Decreased knowledge of use of DME or AE;Pain      OT Treatment/Interventions:      OT Goals(Current goals can be found in the care plan section) Acute Rehab OT Goals Patient Stated Goal: Go home OT Goal Formulation: All assessment and education complete, DC therapy  OT Frequency:     Barriers to D/C:            Co-evaluation              AM-PAC OT "6 Clicks" Daily Activity     Outcome Measure Help from another person eating meals?: None Help from another person taking care of personal grooming?: None Help from another person toileting, which includes using toliet, bedpan, or urinal?: None Help from another person bathing (including washing, rinsing, drying)?: None Help from another person to put on and taking off regular upper  body clothing?: None Help from another person to put on and taking off regular lower body clothing?: None 6 Click Score: 24   End of Session Nurse Communication: Mobility status  Activity Tolerance: Patient tolerated treatment well Patient left: in chair;with call bell/phone within reach  OT Visit Diagnosis: Pain;Muscle weakness (generalized) (M62.81) Pain - Right/Left: Right Pain - part of body:  (R calf)                Time: 0223-3612 OT Time Calculation (min): 18 min Charges:  OT General Charges $OT Visit: 1 Visit OT Evaluation $OT Eval Low Complexity: 1 Low  Bryan Rodgers MSOT, OTR/L Acute Rehab Pager: [phone] Office: [place] 08/28/2020, 8:44 AM

## 2020-08-28 NOTE — Discharge Instructions (Signed)
    Call Your Doctor If Any of These Occur  Redness, drainage, or swelling at the wound.  Temperature greater than 101 degrees. Severe pain not relieved by pain medication. Incision starts to come apart. Follow Up Appt Call  [phone]) for problems.

## 2020-08-28 NOTE — Progress Notes (Signed)
  NEUROSURGERY PROGRESS NOTE   No issues overnight. Pt reports "95%" improvement in right leg pain.  EXAM:  BP (!) 144/83 (BP Location: Right Arm)   Pulse 98   Temp (!) 97.5 F (36.4 C) (Oral)   Resp 17   Ht '5\' 10"'$  (1.778 m)   Wt 95.3 kg   SpO2 97%   BMI 30.13 kg/m   Awake, alert, oriented  Speech fluent, appropriate  CN grossly intact  5/5 BUE/BLE   IMPRESSION:  60 y.o. male POD#1 s/p right L5-S1 extraforaminal resection of synovial cyst  PLAN: - d/c home today   Consuella Lose, MD [facility]

## 2020-09-03 ENCOUNTER — Other Ambulatory Visit: Payer: Self-pay | Admitting: Orthopaedic Surgery

## 2020-10-09 DIAGNOSIS — E785 Hyperlipidemia, unspecified: Secondary | ICD-10-CM | POA: Diagnosis not present

## 2020-10-09 DIAGNOSIS — R7303 Prediabetes: Secondary | ICD-10-CM | POA: Diagnosis not present

## 2020-10-09 DIAGNOSIS — E1169 Type 2 diabetes mellitus with other specified complication: Secondary | ICD-10-CM | POA: Diagnosis not present

## 2020-10-09 DIAGNOSIS — E663 Overweight: Secondary | ICD-10-CM | POA: Diagnosis not present

## 2020-10-09 DIAGNOSIS — R7301 Impaired fasting glucose: Secondary | ICD-10-CM | POA: Diagnosis not present

## 2020-10-09 DIAGNOSIS — I10 Essential (primary) hypertension: Secondary | ICD-10-CM | POA: Diagnosis not present

## 2020-10-09 DIAGNOSIS — E559 Vitamin D deficiency, unspecified: Secondary | ICD-10-CM | POA: Diagnosis not present

## 2020-10-10 LAB — CBC WITH DIFFERENTIAL/PLATELET
Basophils Absolute: 0.1 10*3/uL (ref 0.0–0.2)
Basos: 1 %
EOS (ABSOLUTE): 0.2 10*3/uL (ref 0.0–0.4)
Eos: 3 %
Hematocrit: 42 % (ref 37.5–51.0)
Hemoglobin: 14.2 g/dL (ref 13.0–17.7)
Immature Grans (Abs): 0 10*3/uL (ref 0.0–0.1)
Immature Granulocytes: 0 %
Lymphocytes Absolute: 2.7 10*3/uL (ref 0.7–3.1)
Lymphs: 39 %
MCH: 29.1 pg (ref 26.6–33.0)
MCHC: 33.8 g/dL (ref 31.5–35.7)
MCV: 86 fL (ref 79–97)
Monocytes Absolute: 0.7 10*3/uL (ref 0.1–0.9)
Monocytes: 10 %
Neutrophils Absolute: 3.2 10*3/uL (ref 1.4–7.0)
Neutrophils: 47 %
Platelets: 199 10*3/uL (ref 150–450)
RBC: 4.88 x10E6/uL (ref 4.14–5.80)
RDW: 14.9 % (ref 11.6–15.4)
WBC: 6.9 10*3/uL (ref 3.4–10.8)

## 2020-10-10 LAB — LIPID PANEL
Chol/HDL Ratio: 4.7 ratio (ref 0.0–5.0)
Cholesterol, Total: 146 mg/dL (ref 100–199)
HDL: 31 mg/dL — ABNORMAL LOW (ref >39)
LDL Chol Calc (NIH): 73 mg/dL (ref 0–99)
Triglycerides: 256 mg/dL — ABNORMAL HIGH (ref 0–149)
VLDL Cholesterol Cal: 42 mg/dL — ABNORMAL HIGH (ref 5–40)

## 2020-10-10 LAB — VITAMIN D 25 HYDROXY (VIT D DEFICIENCY, FRACTURES): Vit D, 25-Hydroxy: 26.5 ng/mL — ABNORMAL LOW (ref 30.0–100.0)

## 2020-10-10 LAB — HEMOGLOBIN A1C
Est. average glucose Bld gHb Est-mCnc: 140 mg/dL
Hgb A1c MFr Bld: 6.5 % — ABNORMAL HIGH (ref 4.8–5.6)

## 2020-10-10 LAB — CMP14+EGFR
ALT: 32 IU/L (ref 0–44)
AST: 34 IU/L (ref 0–40)
Albumin/Globulin Ratio: 1.7 (ref 1.2–2.2)
Albumin: 4.5 g/dL (ref 3.8–4.8)
Alkaline Phosphatase: 135 IU/L — ABNORMAL HIGH (ref 44–121)
BUN/Creatinine Ratio: 15 (ref 10–24)
BUN: 15 mg/dL (ref 8–27)
Bilirubin Total: 0.4 mg/dL (ref 0.0–1.2)
CO2: 21 mmol/L (ref 20–29)
Calcium: 9.9 mg/dL (ref 8.6–10.2)
Chloride: 104 mmol/L (ref 96–106)
Creatinine, Ser: 0.98 mg/dL (ref 0.76–1.27)
Globulin, Total: 2.7 g/dL (ref 1.5–4.5)
Glucose: 107 mg/dL — ABNORMAL HIGH (ref 65–99)
Potassium: 4.4 mmol/L (ref 3.5–5.2)
Sodium: 140 mmol/L (ref 134–144)
Total Protein: 7.2 g/dL (ref 6.0–8.5)
eGFR: 88 mL/min/{1.73_m2} (ref >59)

## 2020-10-10 LAB — TSH: TSH: 1.25 u[IU]/mL (ref 0.450–4.500)

## 2020-10-14 ENCOUNTER — Encounter: Payer: BC Managed Care – PPO | Admitting: Family Medicine

## 2020-10-15 ENCOUNTER — Other Ambulatory Visit: Payer: Self-pay | Admitting: Nurse Practitioner

## 2020-10-15 NOTE — Telephone Encounter (Signed)
Do you want this refilled?

## 2020-11-11 ENCOUNTER — Other Ambulatory Visit: Payer: Self-pay | Admitting: Family Medicine

## 2020-11-18 ENCOUNTER — Encounter: Payer: Self-pay | Admitting: Family Medicine

## 2020-11-18 ENCOUNTER — Ambulatory Visit (INDEPENDENT_AMBULATORY_CARE_PROVIDER_SITE_OTHER): Payer: BC Managed Care – PPO | Admitting: Family Medicine

## 2020-11-18 ENCOUNTER — Other Ambulatory Visit: Payer: Self-pay

## 2020-11-18 VITALS — BP 169/78 | HR 88 | Resp 17 | Ht 69.5 in | Wt 214.0 lb

## 2020-11-18 DIAGNOSIS — Z23 Encounter for immunization: Secondary | ICD-10-CM

## 2020-11-18 DIAGNOSIS — E785 Hyperlipidemia, unspecified: Secondary | ICD-10-CM

## 2020-11-18 DIAGNOSIS — Z0001 Encounter for general adult medical examination with abnormal findings: Secondary | ICD-10-CM | POA: Diagnosis not present

## 2020-11-18 DIAGNOSIS — E1169 Type 2 diabetes mellitus with other specified complication: Secondary | ICD-10-CM | POA: Diagnosis not present

## 2020-11-18 DIAGNOSIS — E1159 Type 2 diabetes mellitus with other circulatory complications: Secondary | ICD-10-CM

## 2020-11-18 DIAGNOSIS — I10 Essential (primary) hypertension: Secondary | ICD-10-CM

## 2020-11-18 MED ORDER — GLUCOSE BLOOD VI STRP: ORAL_STRIP | 11 refills | Status: AC

## 2020-11-18 MED ORDER — LANCETS 30G MISC: 11 refills | Status: AC

## 2020-11-18 MED ORDER — TERBINAFINE HCL 250 MG PO TABS: 250.0000 mg | ORAL_TABLET | Freq: Every day | ORAL | 1 refills | Status: DC

## 2020-11-18 MED ORDER — BLOOD GLUCOSE METER KIT: PACK | 0 refills | Status: AC

## 2020-11-18 MED ORDER — TEMAZEPAM 30 MG PO CAPS: 30.0000 mg | ORAL_CAPSULE | Freq: Every day | ORAL | 5 refills | Status: DC

## 2020-11-18 MED ORDER — OZEMPIC (0.25 OR 0.5 MG/DOSE) 2 MG/1.5ML ~~LOC~~ SOPN: 0.2500 mg | PEN_INJECTOR | SUBCUTANEOUS | 3 refills | Status: DC

## 2020-11-18 MED ORDER — CHLORTHALIDONE 25 MG PO TABS: 25.0000 mg | ORAL_TABLET | Freq: Every day | ORAL | 3 refills | Status: DC

## 2020-11-18 MED ORDER — AMLODIPINE-OLMESARTAN 5-20 MG PO TABS: 1.0000 | ORAL_TABLET | Freq: Every day | ORAL | 3 refills | Status: DC

## 2020-11-18 MED ORDER — CYCLOBENZAPRINE HCL 10 MG PO TABS: 10.0000 mg | ORAL_TABLET | Freq: Every day | ORAL | 3 refills | Status: DC

## 2020-11-18 MED ORDER — ROSUVASTATIN CALCIUM 5 MG PO TABS: ORAL_TABLET | ORAL | 3 refills | Status: DC

## 2020-11-18 NOTE — Assessment & Plan Note (Signed)

## 2020-11-18 NOTE — Progress Notes (Signed)
Bryan Rodgers     MRN: [id number]      DOB: 07-Jan-1961   HPI: Patient is in for annual physical exam. uNCONTROLLED BLOOD PRESSURE AND DIABETESMANAGEMENT IS ALALSO ADDRESSED Hyperlipidemia is addressed FUNGAL TOENAIL INFECTION IS TREATED. Recent labs, if available are reviewed. Immunization is reviewed , and  updated    PE; BP (!) 169/78   Pulse 88   Resp 17   Ht 5' 9.5" (1.765 m)   Wt 214 lb 0.6 oz (97.1 kg)   SpO2 96%   BMI 31.16 kg/m   Pleasant male, alert and oriented x 3, in no cardio-pulmonary distress. Afebrile. HEENT No facial trauma or asymetry. Sinuses non tender. EOMI External ears normal,  Neck: supple, no adenopathy,JVD or thyromegaly.No bruits.  Chest: Clear to ascultation bilaterally.No crackles or wheezes. Non tender to palpation  Cardiovascular system; Heart sounds normal,  S1 and  S2 ,no S3.  No murmur, or thrill. Apical beat not displaced Peripheral pulses normal.  Abdomen: Soft, non tender, no organomegaly or masses. No bruits. Bowel sounds normal. No guarding, tenderness or rebound.    Musculoskeletal exam: Decreased ROM of spine, hips , shoulders and knees. No deformity ,swelling or crepitus noted. No muscle wasting or atrophy.   Neurologic: Cranial nerves 2 to 12 intact. Power, tone ,sensation and reflexes normal throughout. No disturbance in gait. No tremor.  Skin: Intact, no ulceration, erythema , scaling or rash noted.hyperpigmented toenails , markedly thickened Pigmentation normal throughout  Psych; Normal mood and affect. Judgement and concentration normal   Assessment & Plan:  Annual visit for general adult medical examination with abnormal findings Annual exam as documented. Counseling done  re healthy lifestyle involving commitment to 150 minutes exercise per week, heart healthy diet, and attaining healthy weight.The importance of adequate sleep also discussed. Regular seat belt use and home safety, is also  discussed. Changes in health habits are decided on by the patient with goals and time frames  set for achieving them. Immunization and cancer screening needs are specifically addressed at this visit.   Hyperlipidemia associated with type 2 diabetes mellitus ([place]) Hyperlipidemia:Low fat diet discussed and encouraged.   Lipid Panel  Lab Results  Component Value Date   CHOL 146 10/09/2020   HDL 31 (L) 10/09/2020   LDLCALC 73 10/09/2020   LDLDIRECT 48 07/08/2010   TRIG 256 (H) 10/09/2020   CHOLHDL 4.7 10/09/2020   start crestor lower fat intake    HTN (hypertension) Uncontrolled change from amlodipine to azor.das  DASH diet and commitment to daily physical activity for a minimum of 30 minutes discussed and encouraged, as a part of hypertension management. The importance of attaining a healthy weight is also discussed.  BP/Weight 11/18/2020 08/28/2020 08/27/2020 08/25/2020 08/11/2020 07/28/2020 5/63/1497  Systolic BP 026 378 - 588 502 774 128  Diastolic BP 78 83 - 84 87 88 79  Wt. (Lbs) 214.04 - 210 210 209 209.8 208  BMI 31.16 - 30.13 30.13 30.86 30.1 30.72       Type 2 diabetes mellitus with vascular disease [name]) [name] is reminded of the importance of commitment to daily physical activity for 30 minutes or more, as able and the need to limit carbohydrate intake to 30 to 60 grams per meal to help with blood sugar control.   The need to take medication as prescribed, test blood sugar as directed, and to call between visits if there is a concern that blood sugar is uncontrolled is also discussed.   Mr.  Smelser is reminded of the importance of daily foot exam, annual eye examination, and good blood sugar, blood pressure and cholesterol control.  Diabetic Labs Latest Ref Rng & Units 10/09/2020 08/28/2020 08/25/2020 04/24/2020 12/20/2019  HbA1c 4.8 - 5.6 % 6.5(H) - 6.4(H) 6.3(H) -  Microalbumin Not Estab. ug/mL - - - - -  Micro/Creat Ratio 0.0 - 30.0 mg/g creat - - - - -  Chol  100 - 199 mg/dL 146 - - 129 -  HDL >39 mg/dL 31(L) - - 35(L) -  Calc LDL 0 - 99 mg/dL 73 - - 69 -  Triglycerides 0 - 149 mg/dL 256(H) - - 141 -  Creatinine 0.76 - 1.27 mg/dL 0.98 1.16 0.97 0.89 1.23   BP/Weight 11/18/2020 08/28/2020 08/27/2020 08/25/2020 08/11/2020 07/28/2020 2/34/1443  Systolic BP 601 658 - 006 349 494 473  Diastolic BP 78 83 - 84 87 88 79  Wt. (Lbs) 214.04 - 210 210 209 209.8 208  BMI 31.16 - 30.13 30.13 30.86 30.1 30.72   Foot/eye exam completion dates Latest Ref Rng & Units 06/26/2020 07/25/2017  Eye Exam No Retinopathy No Retinopathy -  Foot Form Completion - - Done      Start ozempic

## 2020-11-18 NOTE — Progress Notes (Signed)
physi

## 2020-11-18 NOTE — Patient Instructions (Addendum)
F/u in 4 to 6 weeks, re eval chronic conditions  Flu  vaccine and Shingrix No. 2 at this visit.  New for blood pressure is Azor 5/20 tablet daily.  Stop amlodipine 5 mg daily.  Continue chlorthalidone 25 mg daily as before.  New for cholesterol and to reduce your risk of heart disease since you are diabetic is Crestor 5 mg 1 tablet every Monday Wednesday and Friday evening.  New for diabetes is Trulicity/Ozempic 5.14 mg to be injected once weekly.  ( Diabetic testing supply for once daily testing to be sent by nurse  Terbinafine tablet  ONE Daily for 12 weeks for toenail fungus is prescribed  Please focus on low carbohydrate diet with eliminating sweet except for fresh fruit fresh or frozen food.  Commit to 64 ounces of water daily.  Commit to physical activity for 30 minutes each day to help with blood sugar control.  Blood sugar testing once daily fasting blood sugar range is from 80-1 20.  Thanks for choosing [facility], we consider it a privelige to serve you.

## 2020-11-19 ENCOUNTER — Encounter: Payer: Self-pay | Admitting: Family Medicine

## 2020-11-19 DIAGNOSIS — E1169 Type 2 diabetes mellitus with other specified complication: Secondary | ICD-10-CM | POA: Insufficient documentation

## 2020-11-19 DIAGNOSIS — E1159 Type 2 diabetes mellitus with other circulatory complications: Secondary | ICD-10-CM | POA: Insufficient documentation

## 2020-11-19 NOTE — Assessment & Plan Note (Signed)
Hyperlipidemia:Low fat diet discussed and encouraged.   Lipid Panel  Lab Results  Component Value Date   CHOL 146 10/09/2020   HDL 31 (L) 10/09/2020   LDLCALC 73 10/09/2020   LDLDIRECT 48 07/08/2010   TRIG 256 (H) 10/09/2020   CHOLHDL 4.7 10/09/2020   start crestor lower fat intake

## 2020-11-19 NOTE — Assessment & Plan Note (Signed)
Bryan Rodgers is reminded of the importance of commitment to daily physical activity for 30 minutes or more, as able and the need to limit carbohydrate intake to 30 to 60 grams per meal to help with blood sugar control.   The need to take medication as prescribed, test blood sugar as directed, and to call between visits if there is a concern that blood sugar is uncontrolled is also discussed.   Bryan Rodgers is reminded of the importance of daily foot exam, annual eye examination, and good blood sugar, blood pressure and cholesterol control.  Diabetic Labs Latest Ref Rng & Units 10/09/2020 08/28/2020 08/25/2020 04/24/2020 12/20/2019  HbA1c 4.8 - 5.6 % 6.5(H) - 6.4(H) 6.3(H) -  Microalbumin Not Estab. ug/mL - - - - -  Micro/Creat Ratio 0.0 - 30.0 mg/g creat - - - - -  Chol 100 - 199 mg/dL 146 - - 129 -  HDL >39 mg/dL 31(L) - - 35(L) -  Calc LDL 0 - 99 mg/dL 73 - - 69 -  Triglycerides 0 - 149 mg/dL 256(H) - - 141 -  Creatinine 0.76 - 1.27 mg/dL 0.98 1.16 0.97 0.89 1.23   BP/Weight 11/18/2020 08/28/2020 08/27/2020 08/25/2020 08/11/2020 07/28/2020 4/69/6295  Systolic BP 284 132 - 440 102 725 366  Diastolic BP 78 83 - 84 87 88 79  Wt. (Lbs) 214.04 - 210 210 209 209.8 208  BMI 31.16 - 30.13 30.13 30.86 30.1 30.72   Foot/eye exam completion dates Latest Ref Rng & Units 06/26/2020 07/25/2017  Eye Exam No Retinopathy No Retinopathy -  Foot Form Completion - - Done      Start ozempic

## 2020-11-19 NOTE — Assessment & Plan Note (Signed)
Uncontrolled change from amlodipine to azor.das  DASH diet and commitment to daily physical activity for a minimum of 30 minutes discussed and encouraged, as a part of hypertension management. The importance of attaining a healthy weight is also discussed.  BP/Weight 11/18/2020 08/28/2020 08/27/2020 08/25/2020 08/11/2020 07/28/2020 9/47/0761  Systolic BP 518 343 - 735 789 784 784  Diastolic BP 78 83 - 84 87 88 79  Wt. (Lbs) 214.04 - 210 210 209 209.8 208  BMI 31.16 - 30.13 30.13 30.86 30.1 30.72

## 2020-11-20 ENCOUNTER — Telehealth: Payer: Self-pay | Admitting: Family Medicine

## 2020-11-20 NOTE — Telephone Encounter (Signed)
Not on his list. Should he be on it?

## 2020-11-20 NOTE — Telephone Encounter (Signed)
Pt called in about Metformin prescription , says it was talked about at last visit and doesn't see it on AVS. Just wants clarification

## 2020-11-20 NOTE — Telephone Encounter (Signed)
VM box is full/ unable to leave a message

## 2020-11-23 ENCOUNTER — Other Ambulatory Visit: Payer: Self-pay

## 2020-11-23 ENCOUNTER — Telehealth: Payer: Self-pay

## 2020-11-23 NOTE — Telephone Encounter (Signed)
Patient called he was just seen for his CPE last week and Metformin was sent to [facility], said need additional information.  Patient still waiting on the additional information. Please call patient back # [phone].

## 2020-11-23 NOTE — Telephone Encounter (Signed)
Patient called Friday afternoon 10/21 lvm returning a call

## 2020-11-23 NOTE — Telephone Encounter (Signed)
PA submitted for ozempic and approved. Pt aware

## 2020-11-24 NOTE — Telephone Encounter (Signed)
Patient aware he was switched to ozempic instead

## 2021-01-01 ENCOUNTER — Other Ambulatory Visit: Payer: Self-pay | Admitting: Neurology

## 2021-01-02 ENCOUNTER — Other Ambulatory Visit: Payer: Self-pay | Admitting: Family Medicine

## 2021-01-13 ENCOUNTER — Other Ambulatory Visit: Payer: Self-pay

## 2021-01-13 ENCOUNTER — Ambulatory Visit (INDEPENDENT_AMBULATORY_CARE_PROVIDER_SITE_OTHER): Payer: BC Managed Care – PPO | Admitting: Family Medicine

## 2021-01-13 ENCOUNTER — Encounter: Payer: Self-pay | Admitting: Family Medicine

## 2021-01-13 VITALS — BP 130/78 | HR 98 | Resp 17 | Ht 70.0 in | Wt 208.1 lb

## 2021-01-13 DIAGNOSIS — E785 Hyperlipidemia, unspecified: Secondary | ICD-10-CM

## 2021-01-13 DIAGNOSIS — E663 Overweight: Secondary | ICD-10-CM

## 2021-01-13 DIAGNOSIS — I10 Essential (primary) hypertension: Secondary | ICD-10-CM

## 2021-01-13 DIAGNOSIS — E1169 Type 2 diabetes mellitus with other specified complication: Secondary | ICD-10-CM

## 2021-01-13 DIAGNOSIS — Z23 Encounter for immunization: Secondary | ICD-10-CM

## 2021-01-13 DIAGNOSIS — K5790 Diverticulosis of intestine, part unspecified, without perforation or abscess without bleeding: Secondary | ICD-10-CM | POA: Diagnosis not present

## 2021-01-13 DIAGNOSIS — R519 Headache, unspecified: Secondary | ICD-10-CM

## 2021-01-13 DIAGNOSIS — G8929 Other chronic pain: Secondary | ICD-10-CM

## 2021-01-13 DIAGNOSIS — E1159 Type 2 diabetes mellitus with other circulatory complications: Secondary | ICD-10-CM | POA: Diagnosis not present

## 2021-01-13 LAB — POCT GLYCOSYLATED HEMOGLOBIN (HGB A1C): HbA1c, POC (controlled diabetic range): 6.4 % (ref 0.0–7.0)

## 2021-01-13 MED ORDER — OZEMPIC (0.25 OR 0.5 MG/DOSE) 2 MG/1.5ML ~~LOC~~ SOPN: 0.2500 mg | PEN_INJECTOR | SUBCUTANEOUS | 3 refills | Status: DC

## 2021-01-13 NOTE — Patient Instructions (Addendum)
F/u in mid May, call if you need me sooner  GlycoHB in office today  Pneumonia 20 in office today  Bleeding is from diverticulosis, if this increases in amount and frequency you need to go to the Emergency Room  Avoid lifting heavy objects please  Fasting lipid, cmp and eGFr, hBA1C 3 days before next visit  Nurse please check if [facility] has ozempic, if so please send in , if not, let pt know to f/u in Jan with his regular pharmacy ,  [facility]  Thanks for [employer], we consider it a privelige to serve you.

## 2021-01-19 ENCOUNTER — Encounter: Payer: Self-pay | Admitting: Family Medicine

## 2021-01-19 DIAGNOSIS — K5790 Diverticulosis of intestine, part unspecified, without perforation or abscess without bleeding: Secondary | ICD-10-CM | POA: Insufficient documentation

## 2021-01-19 NOTE — Assessment & Plan Note (Signed)
Hyperlipidemia:Low fat diet discussed and encouraged.   Lipid Panel  Lab Results  Component Value Date   CHOL 146 10/09/2020   HDL 31 (L) 10/09/2020   LDLCALC 73 10/09/2020   LDLDIRECT 48 07/08/2010   TRIG 256 (H) 10/09/2020   CHOLHDL 4.7 10/09/2020     Updated lab needed at/ before next visit. Needs to reduce fat in diet

## 2021-01-19 NOTE — Assessment & Plan Note (Signed)
reent episode of painless bleed discussed action plan should bleeding worsen,

## 2021-01-19 NOTE — Assessment & Plan Note (Signed)
Bryan Rodgers is reminded of the importance of commitment to daily physical activity for 30 minutes or more, as able and the need to limit carbohydrate intake to 30 to 60 grams per meal to help with blood sugar control.   The need to take medication as prescribed, test blood sugar as directed, and to call between visits if there is a concern that blood sugar is uncontrolled is also discussed.   Bryan Rodgers is reminded of the importance of daily foot exam, annual eye examination, and good blood sugar, blood pressure and cholesterol control.  Diabetic Labs Latest Ref Rng & Units 01/13/2021 10/09/2020 08/28/2020 08/25/2020 04/24/2020  HbA1c 0.0 - 7.0 % 6.4 6.5(H) - 6.4(H) 6.3(H)  Microalbumin Not Estab. ug/mL - - - - -  Micro/Creat Ratio 0.0 - 30.0 mg/g creat - - - - -  Chol 100 - 199 mg/dL - 146 - - 129  HDL >39 mg/dL - 31(L) - - 35(L)  Calc LDL 0 - 99 mg/dL - 73 - - 69  Triglycerides 0 - 149 mg/dL - 256(H) - - 141  Creatinine 0.76 - 1.27 mg/dL - 0.98 1.16 0.97 0.89   BP/Weight 01/13/2021 11/18/2020 08/28/2020 08/27/2020 08/25/2020 08/11/2020 03/28/7503  Systolic BP 183 358 251 - 898 421 031  Diastolic BP 78 78 83 - 84 87 88  Wt. (Lbs) 208.12 214.04 - 210 210 209 209.8  BMI 29.86 31.16 - 30.13 30.13 30.86 30.1   Foot/eye exam completion dates Latest Ref Rng & Units 01/13/2021 06/26/2020  Eye Exam No Retinopathy - No Retinopathy  Foot Form Completion - Done -    improved

## 2021-01-19 NOTE — Assessment & Plan Note (Signed)
°  Patient re-educated about  the importance of commitment to a  minimum of 150 minutes of exercise per week as able.  The importance of healthy food choices with portion control discussed, as well as eating regularly and within a 12 hour window most days. The need to choose "clean , green" food 50 to 75% of the time is discussed, as well as to make water the primary drink and set a goal of 64 ounces water daily.    Weight /BMI 01/13/2021 11/18/2020 08/27/2020  WEIGHT 208 lb 1.9 oz 214 lb 0.6 oz 210 lb  HEIGHT 5\' 10"  5' 9.5" 5\' 10"   BMI 29.86 kg/m2 31.16 kg/m2 30.13 kg/m2

## 2021-01-19 NOTE — Progress Notes (Signed)
Bryan Rodgers     MRN: [id number]      DOB: 1960-12-16   HPI Mr. Koppelman is here for follow up and re-evaluation of chronic medical conditions, medication management and review of any available recent lab and radiology data.  Preventive health is updated, specifically  Cancer screening and Immunization.   Questions or concerns regarding consultations or procedures which the PT has had in the interim are  addressed. The PT denies any adverse reactions to current medications since the last visit.  Single episode of BRRB this morning, and reports that he had been lifting the day before, painless, less than 1 TBSP, no light headedness   ROS Denies recent fever or chills. Denies sinus pressure, nasal congestion, ear pain or sore throat. Denies chest congestion, productive cough or wheezing. Denies chest pains, palpitations and leg swelling Denies abdominal pain, nausea, vomiting,diarrhea or constipation.   Denies dysuria, frequency, hesitancy or incontinence. Denies joint pain, swelling and limitation in mobility. Denies headaches, seizures, numbness, or tingling. Denies depression, anxiety or insomnia. Denies skin break down or rash. Denies polyuria, polydipsia, blurred vision , or hypoglycemic episodes.    PE  BP 130/78    Pulse 98    Resp 17    Ht 5\' 10"  (1.778 m)    Wt 208 lb 1.9 oz (94.4 kg)    SpO2 94%    BMI 29.86 kg/m   Patient alert and oriented and in no cardiopulmonary distress.  HEENT: No facial asymmetry, EOMI,     Neck supple .  Chest: Clear to auscultation bilaterally.  CVS: S1, S2 no murmurs, no S3.Regular rate.  ABD: Soft non tender.   Ext: No edema  MS: Adequate ROM spine, shoulders, hips and knees.  Skin: Intact, no ulcerations or rash noted.  Psych: Good eye contact, normal affect. Memory intact not anxious or depressed appearing.  CNS: CN 2-12 intact, power,  normal throughout.no focal deficits noted.   Assessment & Plan  HTN  (hypertension) Controlled, no change in medication DASH diet and commitment to daily physical activity for a minimum of 30 minutes discussed and encouraged, as a part of hypertension management. The importance of attaining a healthy weight is also discussed.  BP/Weight 01/13/2021 11/18/2020 08/28/2020 08/27/2020 08/25/2020 08/11/2020 07/27/348  Systolic BP 093 818 299 - 371 696 789  Diastolic BP 78 78 83 - 84 87 88  Wt. (Lbs) 208.12 214.04 - 210 210 209 209.8  BMI 29.86 31.16 - 30.13 30.13 30.86 30.1       Type 2 diabetes mellitus with vascular disease [facility]) Mr. Raczka is reminded of the importance of commitment to daily physical activity for 30 minutes or more, as able and the need to limit carbohydrate intake to 30 to 60 grams per meal to help with blood sugar control.   The need to take medication as prescribed, test blood sugar as directed, and to call between visits if there is a concern that blood sugar is uncontrolled is also discussed.   Mr. Calvin is reminded of the importance of daily foot exam, annual eye examination, and good blood sugar, blood pressure and cholesterol control.  Diabetic Labs Latest Ref Rng & Units 01/13/2021 10/09/2020 08/28/2020 08/25/2020 04/24/2020  HbA1c 0.0 - 7.0 % 6.4 6.5(H) - 6.4(H) 6.3(H)  Microalbumin Not Estab. ug/mL - - - - -  Micro/Creat Ratio 0.0 - 30.0 mg/g creat - - - - -  Chol 100 - 199 mg/dL - 146 - - 129  HDL >39  mg/dL - 31(L) - - 35(L)  Calc LDL 0 - 99 mg/dL - 73 - - 69  Triglycerides 0 - 149 mg/dL - 256(H) - - 141  Creatinine 0.76 - 1.27 mg/dL - 0.98 1.16 0.97 0.89   BP/Weight 01/13/2021 11/18/2020 08/28/2020 08/27/2020 08/25/2020 08/11/2020 5/69/7948  Systolic BP 016 553 748 - 270 786 754  Diastolic BP 78 78 83 - 84 87 88  Wt. (Lbs) 208.12 214.04 - 210 210 209 209.8  BMI 29.86 31.16 - 30.13 30.13 30.86 30.1   Foot/eye exam completion dates Latest Ref Rng & Units 01/13/2021 06/26/2020  Eye Exam No Retinopathy - No Retinopathy  Foot Form  Completion - Done -    improved    Hyperlipidemia associated with type 2 diabetes mellitus ([place]) Hyperlipidemia:Low fat diet discussed and encouraged.   Lipid Panel  Lab Results  Component Value Date   CHOL 146 10/09/2020   HDL 31 (L) 10/09/2020   LDLCALC 73 10/09/2020   LDLDIRECT 48 07/08/2010   TRIG 256 (H) 10/09/2020   CHOLHDL 4.7 10/09/2020     Updated lab needed at/ before next visit. Needs to reduce fat in diet  Overweight (BMI 25.0-29.9)  Patient re-educated about  the importance of commitment to a  minimum of 150 minutes of exercise per week as able.  The importance of healthy food choices with portion control discussed, as well as eating regularly and within a 12 hour window most days. The need to choose "clean , green" food 50 to 75% of the time is discussed, as well as to make water the primary drink and set a goal of 64 ounces water daily.    Weight /BMI 01/13/2021 11/18/2020 08/27/2020  WEIGHT 208 lb 1.9 oz 214 lb 0.6 oz 210 lb  HEIGHT 5\' 10"  5' 9.5" 5\' 10"   BMI 29.86 kg/m2 31.16 kg/m2 30.13 kg/m2      Diverticulosis reent episode of painless bleed discussed action plan should bleeding worsen,   Chronic intractable headache Controlled, no change in medication

## 2021-01-19 NOTE — Assessment & Plan Note (Signed)
Controlled, no change in medication  

## 2021-01-19 NOTE — Assessment & Plan Note (Signed)
Controlled, no change in medication DASH diet and commitment to daily physical activity for a minimum of 30 minutes discussed and encouraged, as a part of hypertension management. The importance of attaining a healthy weight is also discussed.  BP/Weight 01/13/2021 11/18/2020 08/28/2020 08/27/2020 08/25/2020 08/11/2020 2/42/6834  Systolic BP 196 222 979 - 892 119 417  Diastolic BP 78 78 83 - 84 87 88  Wt. (Lbs) 208.12 214.04 - 210 210 209 209.8  BMI 29.86 31.16 - 30.13 30.13 30.86 30.1

## 2021-02-16 ENCOUNTER — Other Ambulatory Visit: Payer: Self-pay | Admitting: Family Medicine

## 2021-03-13 ENCOUNTER — Other Ambulatory Visit: Payer: Self-pay | Admitting: Family Medicine

## 2021-03-13 ENCOUNTER — Other Ambulatory Visit: Payer: Self-pay | Admitting: Neurology

## 2021-04-19 ENCOUNTER — Other Ambulatory Visit: Payer: Self-pay | Admitting: Neurology

## 2021-04-20 ENCOUNTER — Other Ambulatory Visit: Payer: Self-pay | Admitting: Family Medicine

## 2021-04-23 ENCOUNTER — Other Ambulatory Visit: Payer: Self-pay | Admitting: Family Medicine

## 2021-04-29 ENCOUNTER — Telehealth: Payer: Self-pay | Admitting: Family Medicine

## 2021-04-29 ENCOUNTER — Other Ambulatory Visit: Payer: Self-pay

## 2021-04-29 MED ORDER — AMLODIPINE-OLMESARTAN 5-20 MG PO TABS: 1.0000 | ORAL_TABLET | Freq: Every day | ORAL | 3 refills | Status: DC

## 2021-04-29 NOTE — Telephone Encounter (Signed)
Patient needs refill on amLODipine-olmesartan (AZOR) 5-20 MG tablet    

## 2021-04-29 NOTE — Telephone Encounter (Signed)
Refills sent

## 2021-05-13 ENCOUNTER — Other Ambulatory Visit: Payer: Self-pay | Admitting: Family Medicine

## 2021-05-25 ENCOUNTER — Other Ambulatory Visit: Payer: Self-pay | Admitting: Family Medicine

## 2021-06-16 ENCOUNTER — Other Ambulatory Visit: Payer: Self-pay | Admitting: Family Medicine

## 2021-06-16 ENCOUNTER — Encounter: Payer: Self-pay | Admitting: Family Medicine

## 2021-06-16 ENCOUNTER — Ambulatory Visit: Payer: BC Managed Care – PPO | Admitting: Family Medicine

## 2021-06-16 VITALS — BP 100/70 | HR 85 | Ht 70.0 in | Wt 196.1 lb

## 2021-06-16 DIAGNOSIS — E1159 Type 2 diabetes mellitus with other circulatory complications: Secondary | ICD-10-CM

## 2021-06-16 DIAGNOSIS — E785 Hyperlipidemia, unspecified: Secondary | ICD-10-CM

## 2021-06-16 DIAGNOSIS — E1169 Type 2 diabetes mellitus with other specified complication: Secondary | ICD-10-CM

## 2021-06-16 DIAGNOSIS — M7138 Other bursal cyst, other site: Secondary | ICD-10-CM | POA: Insufficient documentation

## 2021-06-16 DIAGNOSIS — I10 Essential (primary) hypertension: Secondary | ICD-10-CM

## 2021-06-16 DIAGNOSIS — E663 Overweight: Secondary | ICD-10-CM

## 2021-06-16 DIAGNOSIS — K219 Gastro-esophageal reflux disease without esophagitis: Secondary | ICD-10-CM

## 2021-06-16 DIAGNOSIS — E559 Vitamin D deficiency, unspecified: Secondary | ICD-10-CM

## 2021-06-16 DIAGNOSIS — B353 Tinea pedis: Secondary | ICD-10-CM

## 2021-06-16 MED ORDER — LAMOTRIGINE 100 MG PO TABS: 100.0000 mg | ORAL_TABLET | Freq: Two times a day (BID) | ORAL | 5 refills | Status: DC

## 2021-06-16 MED ORDER — TEMAZEPAM 30 MG PO CAPS: 30.0000 mg | ORAL_CAPSULE | Freq: Every day | ORAL | 5 refills | Status: DC

## 2021-06-16 MED ORDER — CYCLOBENZAPRINE HCL 10 MG PO TABS: 10.0000 mg | ORAL_TABLET | Freq: Three times a day (TID) | ORAL | 5 refills | Status: DC | PRN

## 2021-06-16 MED ORDER — OZEMPIC (0.25 OR 0.5 MG/DOSE) 2 MG/1.5ML ~~LOC~~ SOPN: 0.5000 mg | PEN_INJECTOR | SUBCUTANEOUS | 3 refills | Status: DC

## 2021-06-16 MED ORDER — TERBINAFINE HCL 250 MG PO TABS: 250.0000 mg | ORAL_TABLET | Freq: Every day | ORAL | 1 refills | Status: DC

## 2021-06-16 NOTE — Patient Instructions (Addendum)
F/U in 8 weeks re evaluate blood pressure call if you need me sooner ? ?STOP chlorthalidone , blood pressure is too low and you get light headed at times ? ?Terbinafine is sent for fungal infection ? ? ?Lipid, cmp and EGFr, hBa1C and Vit D today ? ?Continue all other medications that you are currently taking ? ?Keep up regular exercise ? ?Thanks for choosing [facility], we consider it a privelige to serve you. ? ?

## 2021-06-19 LAB — CMP14+EGFR
ALT: 25 IU/L (ref 0–44)
AST: 24 IU/L (ref 0–40)
Albumin/Globulin Ratio: 1.8 (ref 1.2–2.2)
Albumin: 4.9 g/dL — ABNORMAL HIGH (ref 3.8–4.8)
Alkaline Phosphatase: 143 IU/L — ABNORMAL HIGH (ref 44–121)
BUN/Creatinine Ratio: 19 (ref 10–24)
BUN: 25 mg/dL (ref 8–27)
Bilirubin Total: 0.2 mg/dL (ref 0.0–1.2)
CO2: 22 mmol/L (ref 20–29)
Calcium: 9.4 mg/dL (ref 8.6–10.2)
Chloride: 105 mmol/L (ref 96–106)
Creatinine, Ser: 1.29 mg/dL — ABNORMAL HIGH (ref 0.76–1.27)
Globulin, Total: 2.7 g/dL (ref 1.5–4.5)
Glucose: 91 mg/dL (ref 70–99)
Potassium: 4.7 mmol/L (ref 3.5–5.2)
Sodium: 140 mmol/L (ref 134–144)
Total Protein: 7.6 g/dL (ref 6.0–8.5)
eGFR: 63 mL/min/{1.73_m2} (ref >59)

## 2021-06-19 LAB — LIPID PANEL
Chol/HDL Ratio: 3.3 ratio (ref 0.0–5.0)
Cholesterol, Total: 118 mg/dL (ref 100–199)
HDL: 36 mg/dL — ABNORMAL LOW (ref >39)
LDL Chol Calc (NIH): 56 mg/dL (ref 0–99)
Triglycerides: 154 mg/dL — ABNORMAL HIGH (ref 0–149)
VLDL Cholesterol Cal: 26 mg/dL (ref 5–40)

## 2021-06-19 LAB — HEMOGLOBIN A1C
Est. average glucose Bld gHb Est-mCnc: 128 mg/dL
Hgb A1c MFr Bld: 6.1 % — ABNORMAL HIGH (ref 4.8–5.6)

## 2021-06-19 LAB — VITAMIN D 25 HYDROXY (VIT D DEFICIENCY, FRACTURES): Vit D, 25-Hydroxy: 30.6 ng/mL (ref 30.0–100.0)

## 2021-06-20 ENCOUNTER — Encounter: Payer: Self-pay | Admitting: Family Medicine

## 2021-06-20 NOTE — Progress Notes (Signed)
Bryan Rodgers     MRN: [id number]      DOB: 1961/01/25   HPI Bryan Rodgers is here for follow up and re-evaluation of chronic medical conditions, medication management and review of any available recent lab and radiology data.  Preventive health is updated, specifically  Cancer screening and Immunization.   Questions or concerns regarding consultations or procedures which the PT has had in the interim are  addressed. C/o light headedness C/o fungal infection in toenails   ROS Denies recent fever or chills. Denies sinus pressure, nasal congestion, ear pain or sore throat. Denies chest congestion, productive cough or wheezing. Denies chest pains, palpitations and leg swelling Denies abdominal pain, nausea, vomiting,diarrhea or constipation.   Denies dysuria, frequency, hesitancy or incontinence. Denies joint pain, swelling and limitation in mobility. Denies headaches, seizures, numbness, or tingling. Denies depression, anxiety or insomnia. Denies skin break down or rash.   PE  BP 100/70   Pulse 85   Ht '5\' 10"'$  (1.778 m)   Wt 196 lb 1.9 oz (89 kg)   SpO2 96%   BMI 28.14 kg/m   Patient alert and oriented and in no cardiopulmonary distress.  HEENT: No facial asymmetry, EOMI,     Neck supple .  Chest: Clear to auscultation bilaterally.  CVS: S1, S2 no murmurs, no S3.Regular rate.  ABD: Soft non tender.   Ext: No edema  MS: Adequate ROM spine, shoulders, hips and knees.  Skin: Intact, no ulcerations or rash noted.severe onychomycosis bilaterally  Psych: Good eye contact, normal affect. Memory intact not anxious or depressed appearing.  CNS: CN 2-12 intact, power,  normal throughout.no focal deficits noted.   Assessment & Plan  HTN (hypertension) Over corrected , srop chlorthalidone DASH diet and commitment to daily physical activity for a minimum of 30 minutes discussed and encouraged, as a part of hypertension management. The importance of attaining a healthy  weight is also discussed.     06/16/2021   10:57 AM 06/16/2021   10:21 AM 01/13/2021   10:17 AM 11/18/2020    1:54 PM 08/28/2020    7:33 AM 08/28/2020    4:10 AM 08/28/2020   12:24 AM  BP/Weight  Systolic BP 841 99 660 630 160 109 323  Diastolic BP 70 65 78 78 83 60 61  Wt. (Lbs)  196.12 208.12 214.04     BMI  28.14 kg/m2 29.86 kg/m2 31.16 kg/m2        Re eval in 6 to 8 weeks  Hyperlipidemia associated with type 2 diabetes mellitus ([place]) Hyperlipidemia:Low fat diet discussed and encouraged.   Lipid Panel  Lab Results  Component Value Date   CHOL 118 06/16/2021   HDL 36 (L) 06/16/2021   LDLCALC 56 06/16/2021   LDLDIRECT 48 07/08/2010   TRIG 154 (H) 06/16/2021   CHOLHDL 3.3 06/16/2021   Needs to reduce fried and fatty foods    Type 2 diabetes mellitus with vascular disease ([place]) Bryan Rodgers is reminded of the importance of commitment to daily physical activity for 30 minutes or more, as able and the need to limit carbohydrate intake to 30 to 60 grams per meal to help with blood sugar control.   The need to take medication as prescribed, test blood sugar as directed, and to call between visits if there is a concern that blood sugar is uncontrolled is also discussed.   Bryan Rodgers is reminded of the importance of daily foot exam, annual eye examination, and good blood sugar, blood  pressure and cholesterol control.     Latest Ref Rng & Units 06/16/2021   11:11 AM 01/13/2021   11:09 AM 10/09/2020    8:42 AM 08/28/2020    2:15 AM 08/25/2020   10:36 AM  Diabetic Labs  HbA1c 4.8 - 5.6 % 6.1   6.4   6.5    6.4    Chol 100 - 199 mg/dL 118    146      HDL >39 mg/dL 36    31      Calc LDL 0 - 99 mg/dL 56    73      Triglycerides 0 - 149 mg/dL 154    256      Creatinine 0.76 - 1.27 mg/dL 1.29    0.98   1.16   0.97        06/16/2021   10:57 AM 06/16/2021   10:21 AM 01/13/2021   10:17 AM 11/18/2020    1:54 PM 08/28/2020    7:33 AM 08/28/2020    4:10 AM 08/28/2020   12:24 AM   BP/Weight  Systolic BP 115 99 726 203 559 741 638  Diastolic BP 70 65 78 78 83 60 61  Wt. (Lbs)  196.12 208.12 214.04     BMI  28.14 kg/m2 29.86 kg/m2 31.16 kg/m2         Latest Ref Rng & Units 01/13/2021   10:00 AM 06/26/2020   12:00 AM  Foot/eye exam completion dates  Eye Exam No Retinopathy  No Retinopathy       Foot Form Completion  Done      This result is from an external source.        Overweight (BMI 25.0-29.9) Improved Patient re-educated about  the importance of commitment to a  minimum of 150 minutes of exercise per week as able.  The importance of healthy food choices with portion control discussed, as well as eating regularly and within a 12 hour window most days. The need to choose "clean , green" food 50 to 75% of the time is discussed, as well as to make water the primary drink and set a goal of 64 ounces water daily.       06/16/2021   10:21 AM 01/13/2021   10:17 AM 11/18/2020    1:54 PM  Weight /BMI  Weight 196 lb 1.9 oz 208 lb 1.9 oz 214 lb 0.6 oz  Height '5\' 10"'$  (1.778 m) '5\' 10"'$  (1.778 m) 5' 9.5" (1.765 m)  BMI 28.14 kg/m2 29.86 kg/m2 31.16 kg/m2      Tinea pedis Terbinafine prescribed  GERD (gastroesophageal reflux disease) Controlled, no change in medication

## 2021-06-20 NOTE — Assessment & Plan Note (Signed)
Terbinafine prescribed 

## 2021-06-20 NOTE — Assessment & Plan Note (Signed)
Controlled, no change in medication  

## 2021-06-20 NOTE — Assessment & Plan Note (Signed)
[name] is reminded of the importance of commitment to daily physical activity for 30 minutes or more, as able and the need to limit carbohydrate intake to 30 to 60 grams per meal to help with blood sugar control.   The need to take medication as prescribed, test blood sugar as directed, and to call between visits if there is a concern that blood sugar is uncontrolled is also discussed.   [name] is reminded of the importance of daily foot exam, annual eye examination, and good blood sugar, blood pressure and cholesterol control.     Latest Ref Rng & Units 06/16/2021   11:11 AM 01/13/2021   11:09 AM 10/09/2020    8:42 AM 08/28/2020    2:15 AM 08/25/2020   10:36 AM  Diabetic Labs  HbA1c 4.8 - 5.6 % 6.1   6.4   6.5    6.4    Chol 100 - 199 mg/dL 118    146      HDL >39 mg/dL 36    31      Calc LDL 0 - 99 mg/dL 56    73      Triglycerides 0 - 149 mg/dL 154    256      Creatinine 0.76 - 1.27 mg/dL 1.29    0.98   1.16   0.97        06/16/2021   10:57 AM 06/16/2021   10:21 AM 01/13/2021   10:17 AM 11/18/2020    1:54 PM 08/28/2020    7:33 AM 08/28/2020    4:10 AM 08/28/2020   12:24 AM  BP/Weight  Systolic BP 673 99 419 379 024 097 353  Diastolic BP 70 65 78 78 83 60 61  Wt. (Lbs)  196.12 208.12 214.04     BMI  28.14 kg/m2 29.86 kg/m2 31.16 kg/m2         Latest Ref Rng & Units 01/13/2021   10:00 AM 06/26/2020   12:00 AM  Foot/eye exam completion dates  Eye Exam No Retinopathy  No Retinopathy       Foot Form Completion  Done      This result is from an external source.

## 2021-06-20 NOTE — Assessment & Plan Note (Addendum)
Improved Patient re-educated about  the importance of commitment to a  minimum of 150 minutes of exercise per week as able.  The importance of healthy food choices with portion control discussed, as well as eating regularly and within a 12 hour window most days. The need to choose "clean , green" food 50 to 75% of the time is discussed, as well as to make water the primary drink and set a goal of 64 ounces water daily.       06/16/2021   10:21 AM 01/13/2021   10:17 AM 11/18/2020    1:54 PM  Weight /BMI  Weight 196 lb 1.9 oz 208 lb 1.9 oz 214 lb 0.6 oz  Height '5\' 10"'$  (1.778 m) '5\' 10"'$  (1.778 m) 5' 9.5" (1.765 m)  BMI 28.14 kg/m2 29.86 kg/m2 31.16 kg/m2

## 2021-06-20 NOTE — Assessment & Plan Note (Signed)
Over corrected , srop chlorthalidone DASH diet and commitment to daily physical activity for a minimum of 30 minutes discussed and encouraged, as a part of hypertension management. The importance of attaining a healthy weight is also discussed.     06/16/2021   10:57 AM 06/16/2021   10:21 AM 01/13/2021   10:17 AM 11/18/2020    1:54 PM 08/28/2020    7:33 AM 08/28/2020    4:10 AM 08/28/2020   12:24 AM  BP/Weight  Systolic BP 683 99 729 021 115 520 802  Diastolic BP 70 65 78 78 83 60 61  Wt. (Lbs)  196.12 208.12 214.04     BMI  28.14 kg/m2 29.86 kg/m2 31.16 kg/m2        Re eval in 6 to 8 weeks

## 2021-06-20 NOTE — Assessment & Plan Note (Signed)
Hyperlipidemia:Low fat diet discussed and encouraged.   Lipid Panel  Lab Results  Component Value Date   CHOL 118 06/16/2021   HDL 36 (L) 06/16/2021   LDLCALC 56 06/16/2021   LDLDIRECT 48 07/08/2010   TRIG 154 (H) 06/16/2021   CHOLHDL 3.3 06/16/2021   Needs to reduce fried and fatty foods

## 2021-06-21 DIAGNOSIS — M7138 Other bursal cyst, other site: Secondary | ICD-10-CM | POA: Diagnosis not present

## 2021-06-21 DIAGNOSIS — Z6828 Body mass index (BMI) 28.0-28.9, adult: Secondary | ICD-10-CM | POA: Diagnosis not present

## 2021-06-21 DIAGNOSIS — G8929 Other chronic pain: Secondary | ICD-10-CM | POA: Diagnosis not present

## 2021-06-21 DIAGNOSIS — M545 Low back pain, unspecified: Secondary | ICD-10-CM | POA: Diagnosis not present

## 2021-07-22 ENCOUNTER — Encounter: Payer: Self-pay | Admitting: *Deleted

## 2021-07-24 ENCOUNTER — Other Ambulatory Visit: Payer: Self-pay | Admitting: Family Medicine

## 2021-08-11 ENCOUNTER — Ambulatory Visit: Payer: BC Managed Care – PPO | Admitting: Family Medicine

## 2021-08-11 ENCOUNTER — Encounter: Payer: Self-pay | Admitting: Family Medicine

## 2021-08-11 VITALS — BP 110/70 | HR 85 | Ht 70.0 in | Wt 200.0 lb

## 2021-08-11 DIAGNOSIS — E663 Overweight: Secondary | ICD-10-CM

## 2021-08-11 DIAGNOSIS — E1169 Type 2 diabetes mellitus with other specified complication: Secondary | ICD-10-CM | POA: Diagnosis not present

## 2021-08-11 DIAGNOSIS — E1159 Type 2 diabetes mellitus with other circulatory complications: Secondary | ICD-10-CM | POA: Diagnosis not present

## 2021-08-11 DIAGNOSIS — I10 Essential (primary) hypertension: Secondary | ICD-10-CM

## 2021-08-11 DIAGNOSIS — E785 Hyperlipidemia, unspecified: Secondary | ICD-10-CM

## 2021-08-11 DIAGNOSIS — R519 Headache, unspecified: Secondary | ICD-10-CM

## 2021-08-11 DIAGNOSIS — E559 Vitamin D deficiency, unspecified: Secondary | ICD-10-CM

## 2021-08-11 DIAGNOSIS — G8929 Other chronic pain: Secondary | ICD-10-CM

## 2021-08-11 DIAGNOSIS — R972 Elevated prostate specific antigen [PSA]: Secondary | ICD-10-CM

## 2021-08-11 MED ORDER — ROSUVASTATIN CALCIUM 5 MG PO TABS: ORAL_TABLET | ORAL | 3 refills | Status: DC

## 2021-08-11 NOTE — Assessment & Plan Note (Signed)
Hyperlipidemia:Low fat diet discussed and encouraged.   Lipid Panel  Lab Results  Component Value Date   CHOL 118 06/16/2021   HDL 36 (L) 06/16/2021   LDLCALC 56 06/16/2021   LDLDIRECT 48 07/08/2010   TRIG 154 (H) 06/16/2021   CHOLHDL 3.3 06/16/2021     Start crestor 3 times weekly

## 2021-08-11 NOTE — Patient Instructions (Addendum)
Annual exam  with flu vaccine 10/20 or shortly after, call if you need me sooner  Excellent BP,   Microalb today  New is crestor one tab every Monday, Wednesday and  Friday for heart protection  Fasting blood sugar range is 80 to 130   It is important that you exercise regularly at least 30 minutes 5 times a week. If you develop chest pain, have severe difficulty breathing, or feel very tired, stop exercising immediately and seek medical attention   Think about what you will eat, plan ahead. Choose " clean, green, fresh or frozen" over canned, processed or packaged foods which are more sugary, salty and fatty. 70 to 75% of food eaten should be vegetables and fruit. Three meals at set times with snacks allowed between meals, but they must be fruit or vegetables. Aim to eat over a 12 hour period , example 7 am to 7 pm, and STOP after  your last meal of the day. Drink water,generally about 64 ounces per day, no other drink is as healthy. Fruit juice is best enjoyed in a healthy way, by EATING the fruit.  Fasting lipid , cmp and EGFr, HBA1C  , CBC and TSH 5 days  before visit  Thanks for choosing [facility], we consider it a privelige to serve you.

## 2021-08-11 NOTE — Assessment & Plan Note (Signed)
  Patient re-educated about  the importance of commitment to a  minimum of 150 minutes of exercise per week as able.  The importance of healthy food choices with portion control discussed, as well as eating regularly and within a 12 hour window most days. The need to choose "clean , green" food 50 to 75% of the time is discussed, as well as to make water the primary drink and set a goal of 64 ounces water daily.       08/11/2021    8:30 AM 06/16/2021   10:21 AM 01/13/2021   10:17 AM  Weight /BMI  Weight 200 lb 0.6 oz 196 lb 1.9 oz 208 lb 1.9 oz  Height '5\' 10"'$  (1.778 m) '5\' 10"'$  (1.778 m) '5\' 10"'$  (1.778 m)  BMI 28.7 kg/m2 28.14 kg/m2 29.86 kg/m2

## 2021-08-11 NOTE — Assessment & Plan Note (Signed)
Managed by Urology 

## 2021-08-11 NOTE — Assessment & Plan Note (Signed)
Controlled, no change in medication DASH diet and commitment to daily physical activity for a minimum of 30 minutes discussed and encouraged, as a part of hypertension management. The importance of attaining a healthy weight is also discussed.     08/11/2021    8:39 AM 08/11/2021    8:30 AM 06/16/2021   10:57 AM 06/16/2021   10:21 AM 01/13/2021   10:17 AM 11/18/2020    1:54 PM 08/28/2020    7:33 AM  BP/Weight  Systolic BP 100 712 197 99 588 325 498  Diastolic BP 70 80 70 65 78 78 83  Wt. (Lbs)  200.04  196.12 208.12 214.04   BMI  28.7 kg/m2  28.14 kg/m2 29.86 kg/m2 31.16 kg/m2

## 2021-08-11 NOTE — Assessment & Plan Note (Signed)
Mr. Piatek is reminded of the importance of commitment to daily physical activity for 30 minutes or more, as able and the need to limit carbohydrate intake to 30 to 60 grams per meal to help with blood sugar control.   The need to take medication as prescribed, test blood sugar as directed, and to call between visits if there is a concern that blood sugar is uncontrolled is also discussed.   Mr. Cross is reminded of the importance of daily foot exam, annual eye examination, and good blood sugar, blood pressure and cholesterol control.     Latest Ref Rng & Units 06/16/2021   11:11 AM 01/13/2021   11:09 AM 10/09/2020    8:42 AM 08/28/2020    2:15 AM 08/25/2020   10:36 AM  Diabetic Labs  HbA1c 4.8 - 5.6 % 6.1  6.4  6.5   6.4   Chol 100 - 199 mg/dL 118   146     HDL >39 mg/dL 36   31     Calc LDL 0 - 99 mg/dL 56   73     Triglycerides 0 - 149 mg/dL 154   256     Creatinine 0.76 - 1.27 mg/dL 1.29   0.98  1.16  0.97       08/11/2021    8:39 AM 08/11/2021    8:30 AM 06/16/2021   10:57 AM 06/16/2021   10:21 AM 01/13/2021   10:17 AM 11/18/2020    1:54 PM 08/28/2020    7:33 AM  BP/Weight  Systolic BP 202 334 356 99 861 683 729  Diastolic BP 70 80 70 65 78 78 83  Wt. (Lbs)  200.04  196.12 208.12 214.04   BMI  28.7 kg/m2  28.14 kg/m2 29.86 kg/m2 31.16 kg/m2       Latest Ref Rng & Units 01/13/2021   10:00 AM 06/26/2020   12:00 AM  Foot/eye exam completion dates  Eye Exam No Retinopathy  No Retinopathy      Foot Form Completion  Done      This result is from an external source.     ` Updated lab needed at/ before next visit. Start statin

## 2021-08-11 NOTE — Assessment & Plan Note (Signed)
Controlled, no change in medication  

## 2021-08-11 NOTE — Progress Notes (Signed)
Bryan Rodgers     MRN: [id number]      DOB: 09/01/1960   HPI Bryan Rodgers is here for follow up and re-evaluation of chronic medical conditions, medication management and review of any available recent lab and radiology data.  Preventive health is updated, specifically  Cancer screening and Immunization.   Questions or concerns regarding consultations or procedures which the PT has had in the interim are  addressed. The PT denies any adverse reactions to current medications since the last visit.  There are no new concerns.  There are no specific complaints  Denies polyuria, polydipsia, blurred vision , or hypoglycemic episodes.   ROS Denies recent fever or chills. Denies sinus pressure, nasal congestion, ear pain or sore throat. Denies chest congestion, productive cough or wheezing. Denies chest pains, palpitations and leg swelling Denies abdominal pain, nausea, vomiting,diarrhea or constipation.   Denies dysuria, frequency, hesitancy or incontinence. Denies joint pain, swelling and limitation in mobility. Denies headaches, seizures, numbness, or tingling. Denies depression, anxiety or insomnia. Denies skin break down or rash.   PE  BP 110/70   Pulse 85   Ht '5\' 10"'$  (1.778 m)   Wt 200 lb 0.6 oz (90.7 kg)   SpO2 96%   BMI 28.70 kg/m   Patient alert and oriented and in no cardiopulmonary distress.  HEENT: No facial asymmetry, EOMI,     Neck supple .  Chest: Clear to auscultation bilaterally.  CVS: S1, S2 no murmurs, no S3.Regular rate.  ABD: Soft non tender.   Ext: No edema  MS: Adequate ROM spine, shoulders, hips and knees.  Skin: Intact, no ulcerations or rash noted.  Psych: Good eye contact, normal affect. Memory intact not anxious or depressed appearing.  CNS: CN 2-12 intact, power,  normal throughout.no focal deficits noted.   Assessment & Plan  Type 2 diabetes mellitus with vascular disease [facility]) Bryan Rodgers is reminded of the importance of  commitment to daily physical activity for 30 minutes or more, as able and the need to limit carbohydrate intake to 30 to 60 grams per meal to help with blood sugar control.   The need to take medication as prescribed, test blood sugar as directed, and to call between visits if there is a concern that blood sugar is uncontrolled is also discussed.   Bryan Rodgers is reminded of the importance of daily foot exam, annual eye examination, and good blood sugar, blood pressure and cholesterol control.     Latest Ref Rng & Units 06/16/2021   11:11 AM 01/13/2021   11:09 AM 10/09/2020    8:42 AM 08/28/2020    2:15 AM 08/25/2020   10:36 AM  Diabetic Labs  HbA1c 4.8 - 5.6 % 6.1  6.4  6.5   6.4   Chol 100 - 199 mg/dL 118   146     HDL >39 mg/dL 36   31     Calc LDL 0 - 99 mg/dL 56   73     Triglycerides 0 - 149 mg/dL 154   256     Creatinine 0.76 - 1.27 mg/dL 1.29   0.98  1.16  0.97       08/11/2021    8:39 AM 08/11/2021    8:30 AM 06/16/2021   10:57 AM 06/16/2021   10:21 AM 01/13/2021   10:17 AM 11/18/2020    1:54 PM 08/28/2020    7:33 AM  BP/Weight  Systolic BP 628 366 294 99 765 465 035  Diastolic  BP 70 80 70 65 78 78 83  Wt. (Lbs)  200.04  196.12 208.12 214.04   BMI  28.7 kg/m2  28.14 kg/m2 29.86 kg/m2 31.16 kg/m2       Latest Ref Rng & Units 01/13/2021   10:00 AM 06/26/2020   12:00 AM  Foot/eye exam completion dates  Eye Exam No Retinopathy  No Retinopathy      Foot Form Completion  Done      This result is from an external source.     ` Updated lab needed at/ before next visit. Start statin  HTN (hypertension) Controlled, no change in medication DASH diet and commitment to daily physical activity for a minimum of 30 minutes discussed and encouraged, as a part of hypertension management. The importance of attaining a healthy weight is also discussed.     08/11/2021    8:39 AM 08/11/2021    8:30 AM 06/16/2021   10:57 AM 06/16/2021   10:21 AM 01/13/2021   10:17 AM 11/18/2020     1:54 PM 08/28/2020    7:33 AM  BP/Weight  Systolic BP 884 166 063 99 016 010 932  Diastolic BP 70 80 70 65 78 78 83  Wt. (Lbs)  200.04  196.12 208.12 214.04   BMI  28.7 kg/m2  28.14 kg/m2 29.86 kg/m2 31.16 kg/m2        Hyperlipidemia associated with type 2 diabetes mellitus ([place]) Hyperlipidemia:Low fat diet discussed and encouraged.   Lipid Panel  Lab Results  Component Value Date   CHOL 118 06/16/2021   HDL 36 (L) 06/16/2021   LDLCALC 56 06/16/2021   LDLDIRECT 48 07/08/2010   TRIG 154 (H) 06/16/2021   CHOLHDL 3.3 06/16/2021     Start crestor 3 times weekly  Overweight (BMI 25.0-29.9)  Patient re-educated about  the importance of commitment to a  minimum of 150 minutes of exercise per week as able.  The importance of healthy food choices with portion control discussed, as well as eating regularly and within a 12 hour window most days. The need to choose "clean , green" food 50 to 75% of the time is discussed, as well as to make water the primary drink and set a goal of 64 ounces water daily.       08/11/2021    8:30 AM 06/16/2021   10:21 AM 01/13/2021   10:17 AM  Weight /BMI  Weight 200 lb 0.6 oz 196 lb 1.9 oz 208 lb 1.9 oz  Height '5\' 10"'$  (1.778 m) '5\' 10"'$  (1.778 m) '5\' 10"'$  (1.778 m)  BMI 28.7 kg/m2 28.14 kg/m2 29.86 kg/m2      Chronic intractable headache Controlled, no change in medication   Elevated PSA Managed by Urology

## 2021-08-13 LAB — MICROALBUMIN / CREATININE URINE RATIO
Creatinine, Urine: 99.8 mg/dL
Microalb/Creat Ratio: 14 mg/g creat (ref 0–29)
Microalbumin, Urine: 14.4 ug/mL

## 2021-09-27 ENCOUNTER — Other Ambulatory Visit: Payer: Self-pay

## 2021-09-27 ENCOUNTER — Telehealth: Payer: Self-pay | Admitting: Family Medicine

## 2021-09-27 MED ORDER — AMLODIPINE-OLMESARTAN 5-20 MG PO TABS: 1.0000 | ORAL_TABLET | Freq: Every day | ORAL | 6 refills | Status: DC

## 2021-09-27 NOTE — Telephone Encounter (Signed)
Med refilled.

## 2021-09-27 NOTE — Telephone Encounter (Signed)
Patient needs refill on amLODipine-olmesartan (AZOR) 5-20 MG tablet

## 2021-11-12 ENCOUNTER — Telehealth: Payer: Self-pay | Admitting: Family Medicine

## 2021-11-12 NOTE — Telephone Encounter (Signed)
Patient needs refill only has one pill left    omeprazole (PRILOSEC) 40 MG capsule

## 2021-11-15 ENCOUNTER — Other Ambulatory Visit: Payer: Self-pay

## 2021-11-15 MED ORDER — OMEPRAZOLE 40 MG PO CPDR: DELAYED_RELEASE_CAPSULE | ORAL | 4 refills | Status: DC

## 2021-11-15 NOTE — Telephone Encounter (Signed)
Refills sent

## 2021-11-17 IMAGING — MR MR LUMBAR SPINE W/O CM
5 series · 31 of 48 positions shown · non-contrast
Comparison: Lumbar radiographs standard 06/11/2020

CLINICAL DATA: Low back pain with right leg pain. Right leg
weakness and numbness.

EXAM:
MRI LUMBAR SPINE WITHOUT CONTRAST
TECHNIQUE: Multiplanar, multisequence MR imaging of the lumbar spine was
performed. No intravenous contrast was administered.

[Series 5: T2 · sagittal · 4.0mm · 0.68mm/px · 6 of 15 slices shown (1 of 2)]
[im 1/15]
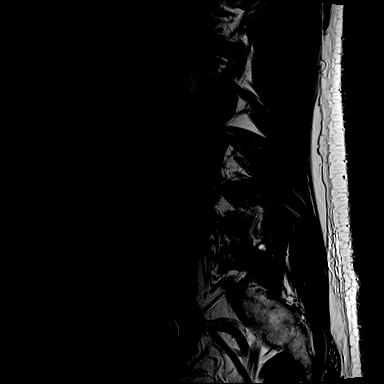
[im 3/15]
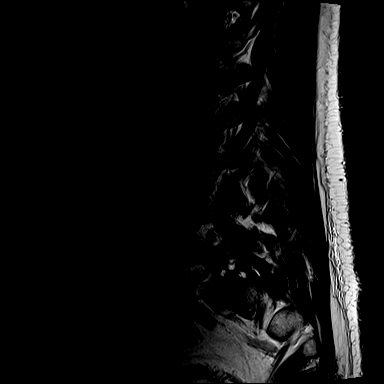
[im 6/15]
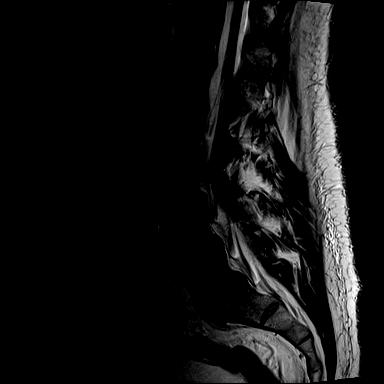
[im 9/15]
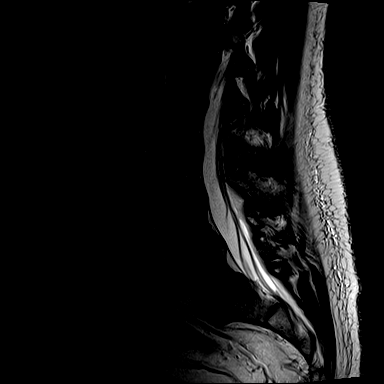
[im 12/15]
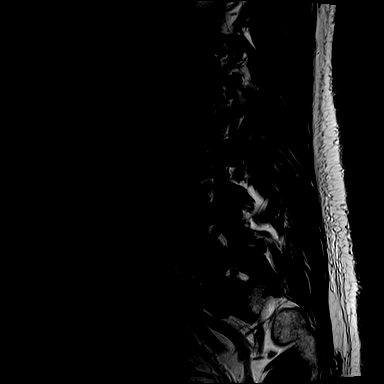
[im 15/15]
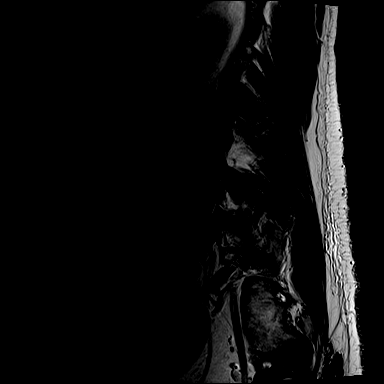

[Series 6: T1 · sagittal · 4.0mm · 0.81mm/px · 6 of 15 slices shown (1 of 2)]
[im 1/15]
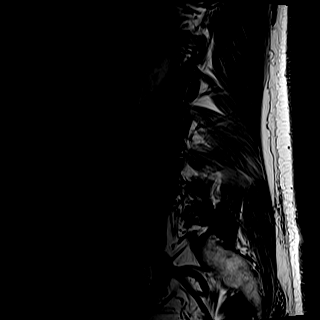
[im 3/15]
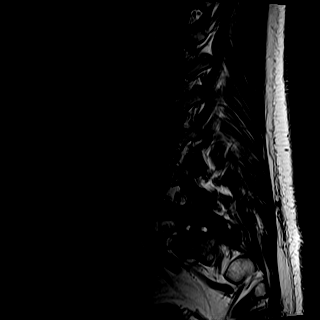
[im 6/15]
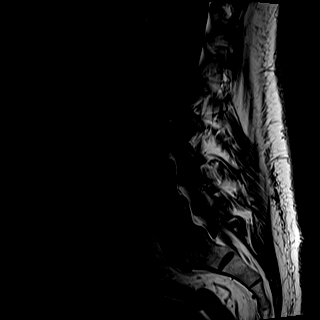
[im 9/15]
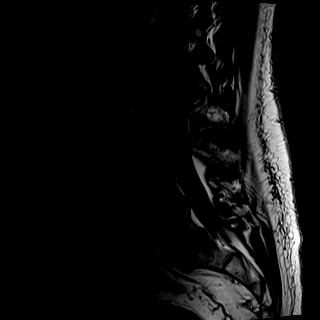
[im 12/15]
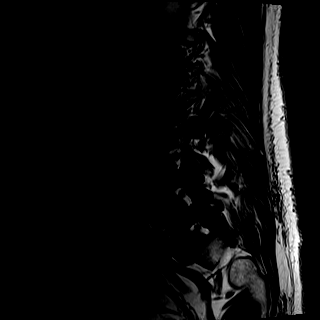
[im 15/15]
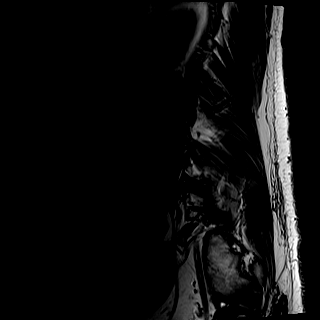

[Series 7: STIR · sagittal · 4.0mm · 0.51mm/px · 1 of 15 slices shown]
[im 1/15]
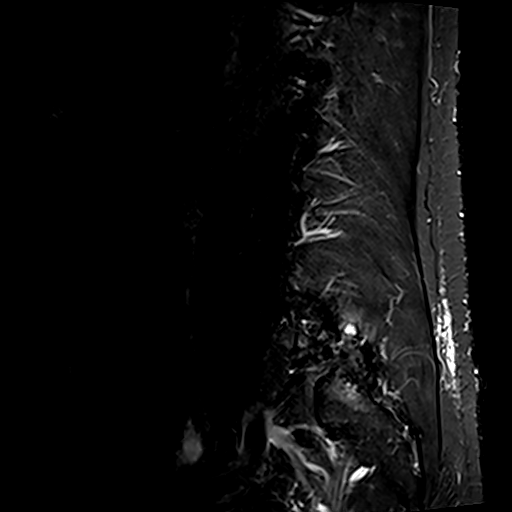

[Series 8: T2 · axial · 4.0mm · 0.70mm/px · z∈[-66,+165]mm · 9 of 35 slices shown (2 of 2)]
[im 1/35]
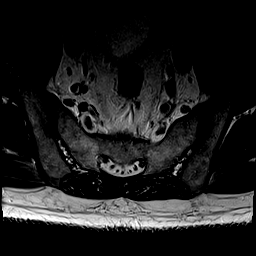
[im 5/35]
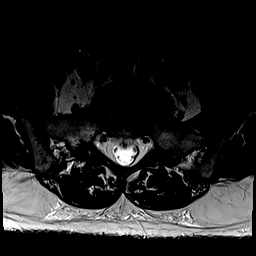
[im 10/35]
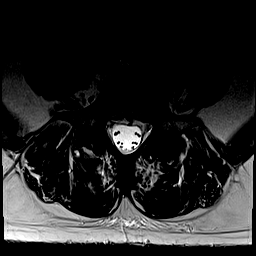
[im 15/35]
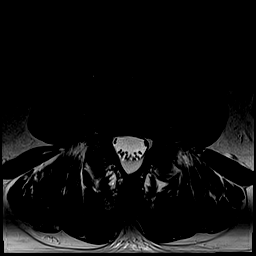
[im 18/35]
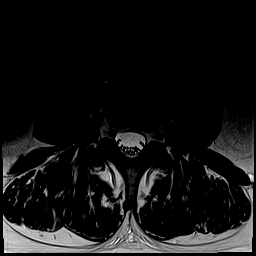
[im 20/35]
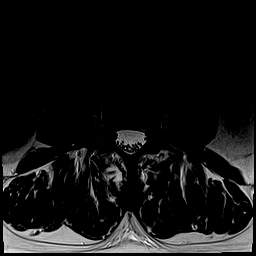
[im 25/35]
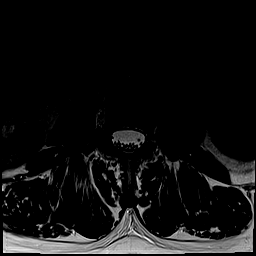
[im 30/35]
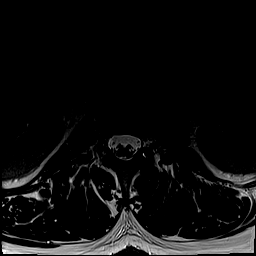
[im 35/35]
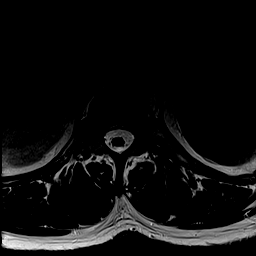

[Series 9: T1 · axial · 4.0mm · 0.35mm/px · z∈[-66,+165]mm · 9 of 35 slices shown (2 of 2)]
[im 1/35]
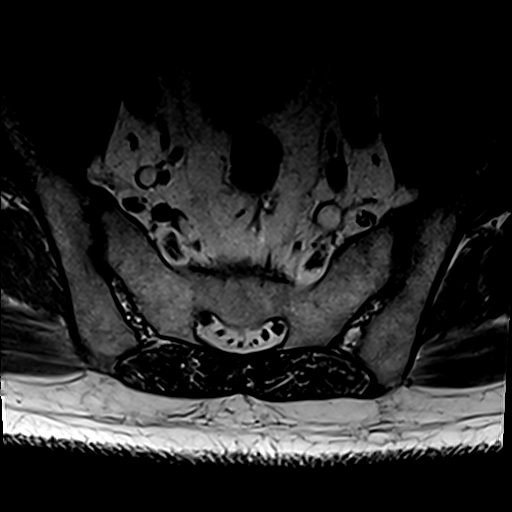
[im 5/35]
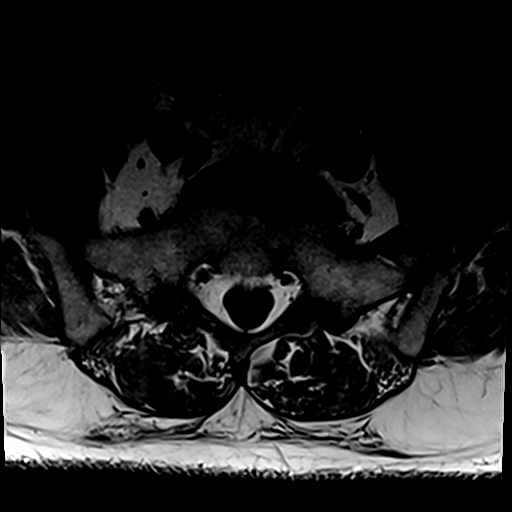
[im 10/35]
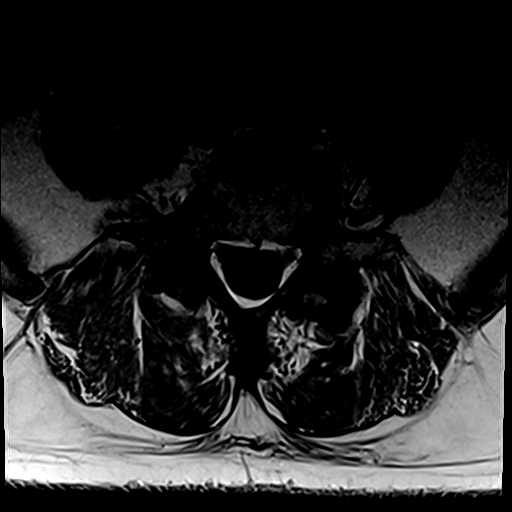
[im 15/35]
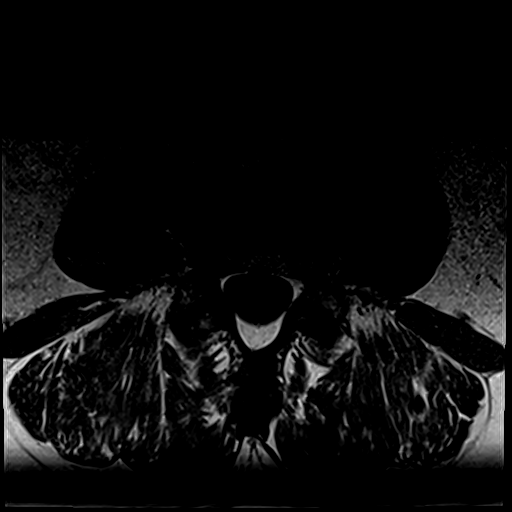
[im 18/35]
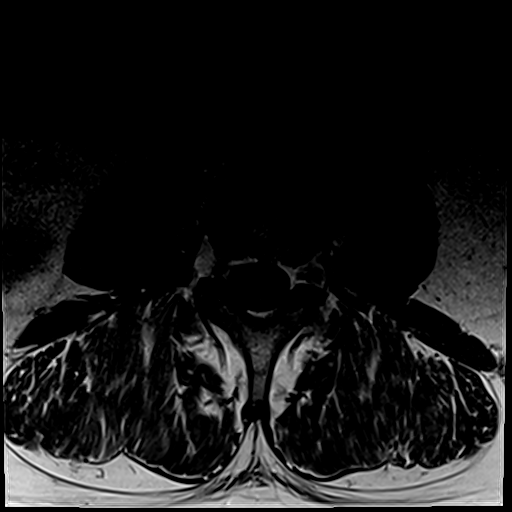
[im 20/35]
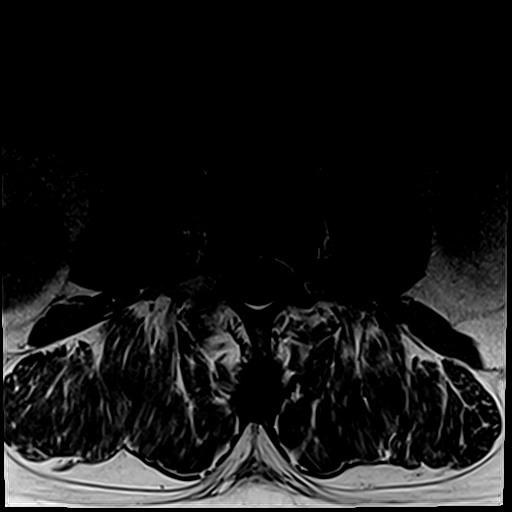
[im 25/35]
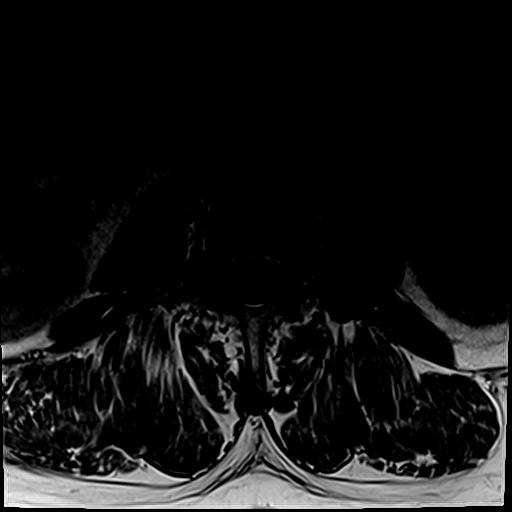
[im 30/35]
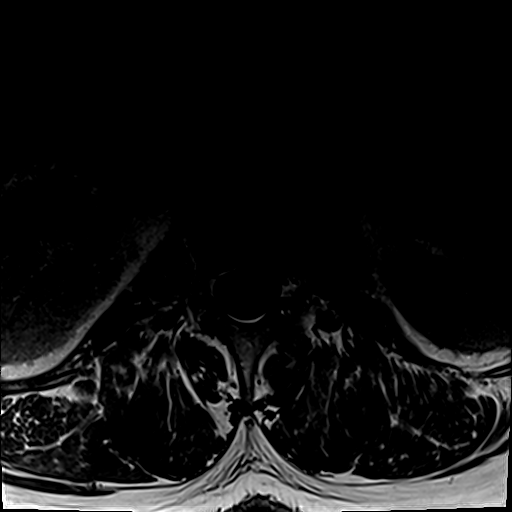
[im 35/35]
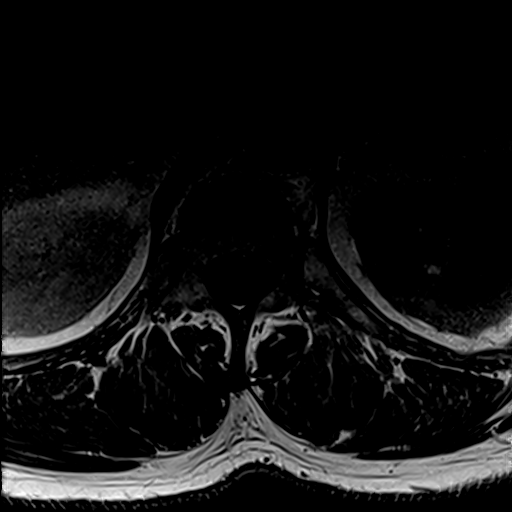

[31 of 48 positions shown; findings below may reference images not displayed]

FINDINGS: Segmentation:  4 mm

Alignment:  Anterolisthesis L4-5.  Remaining alignment normal.

Vertebrae:  Negative for fracture or mass.  Normal bone marrow.

Conus medullaris and cauda equina: Conus extends to the L1-2 level.
Conus and cauda equina appear normal.

Paraspinal and other soft tissues: Negative for paraspinous mass or
adenopathy.

Disc levels:

T12-L1: Moderate disc degeneration with mild spurring. Negative for
stenosis

L1-2: Mild disc degeneration and mild endplate spurring. Negative
for stenosis

L2-3: Mild disc and mild facet degeneration.  Negative for stenosis

L3-4: Mild disc and mild facet degeneration.  Negative for stenosis

L4-5: Mild anterolisthesis with moderate facet degeneration. Mild
disc degeneration. Negative for spinal or foraminal stenosis

L5-S1: Disc degeneration with diffuse disc bulging. Advanced facet
degeneration on the right with facet joint effusion and bony
overgrowth. 5 mm synovial cyst projects into the right foramen and
causes right L5 nerve root compression. Additional 4 mm synovial
cyst projects posteriorly to the right facet into the muscle. Left
foramen widely patent.
IMPRESSION: Right L5 nerve root impingement in the foramen due to facet
degeneration and spurring and a 5 mm synovial cyst projecting into
the right foramen.

Mild degenerative change throughout the lumbar spine without
additional area of neural impingement.

## 2021-12-03 ENCOUNTER — Encounter: Payer: Self-pay | Admitting: Family Medicine

## 2021-12-03 ENCOUNTER — Ambulatory Visit (INDEPENDENT_AMBULATORY_CARE_PROVIDER_SITE_OTHER): Payer: BC Managed Care – PPO | Admitting: Family Medicine

## 2021-12-03 VITALS — BP 120/74 | HR 81 | Ht 70.0 in | Wt 204.0 lb

## 2021-12-03 DIAGNOSIS — R519 Headache, unspecified: Secondary | ICD-10-CM

## 2021-12-03 DIAGNOSIS — I10 Essential (primary) hypertension: Secondary | ICD-10-CM | POA: Diagnosis not present

## 2021-12-03 DIAGNOSIS — Z23 Encounter for immunization: Secondary | ICD-10-CM | POA: Diagnosis not present

## 2021-12-03 DIAGNOSIS — Z0000 Encounter for general adult medical examination without abnormal findings: Secondary | ICD-10-CM | POA: Diagnosis not present

## 2021-12-03 DIAGNOSIS — R413 Other amnesia: Secondary | ICD-10-CM | POA: Diagnosis not present

## 2021-12-03 DIAGNOSIS — R7303 Prediabetes: Secondary | ICD-10-CM | POA: Diagnosis not present

## 2021-12-03 DIAGNOSIS — E1169 Type 2 diabetes mellitus with other specified complication: Secondary | ICD-10-CM | POA: Diagnosis not present

## 2021-12-03 DIAGNOSIS — E785 Hyperlipidemia, unspecified: Secondary | ICD-10-CM | POA: Diagnosis not present

## 2021-12-03 DIAGNOSIS — G8929 Other chronic pain: Secondary | ICD-10-CM

## 2021-12-03 NOTE — Assessment & Plan Note (Signed)
6 month h/o increased forgetfulness reported by spouse and pt agrees, obtain brain scan and refer to Neurology

## 2021-12-03 NOTE — Patient Instructions (Addendum)
F/U in 5 months, call if you need me sooner  Flu vaccine today  Nurse please enter vision screen  Covid and RSV vaccines are recommended , both are at your pharmacy  You are referred for brain scan and Neurology  Labs today already ordered  It is important that you exercise regularly at least 30 minutes 5 times a week. If you develop chest pain, have severe difficulty breathing, or feel very tired, stop exercising immediately and seek medical attention    Think about what you will eat, plan ahead. Choose " clean, green, fresh or frozen" over canned, processed or packaged foods which are more sugary, salty and fatty. 70 to 75% of food eaten should be vegetables and fruit. Three meals at set times with snacks allowed between meals, but they must be fruit or vegetables. Aim to eat over a 12 hour period , example 7 am to 7 pm, and STOP after  your last meal of the day. Drink water,generally about 64 ounces per day, no other drink is as healthy. Fruit juice is best enjoyed in a healthy way, by EATING the fruit.  Thanks for choosing [facility], we consider it a privelige to serve you.

## 2021-12-03 NOTE — Progress Notes (Signed)
   DEVONN GIAMPIETRO     MRN: [id number]      DOB: 08-19-1960   HPI: Patient is in for annual physical exam. C/o lumps on right calf x 3 months, non tender, no real change in size 6 month h/o increased forgetfulness, has left cash card  at bank 3 times, can't remember where objects are in the house, no longer felt safe to drive on his own, per spouse who is present C/o uncontrolled headaches has more than 2/ month   Immunization is reviewed , and  updated .    PE; BP 120/74 (BP Location: Right Arm, Patient Position: Sitting, Cuff Size: Normal)   Pulse 81   Ht '5\' 10"'$  (1.778 m)   Wt 204 lb 0.6 oz (92.6 kg)   SpO2 96%   BMI 29.28 kg/m    Pleasant male, alert and oriented x 3, in no cardio-pulmonary distress. Afebrile. HEENT No facial trauma or asymetry. Sinuses non tender. EOMI External ears normal,  Neck: supple, no adenopathy,JVD or thyromegaly.No bruits.  Chest: Clear to ascultation bilaterally.No crackles or wheezes. Non tender to palpation  Cardiovascular system; Heart sounds normal,  S1 and  S2 ,no S3.  No murmur, or thrill. Apical beat not displaced Peripheral pulses normal.  Abdomen: Soft, non tender, no organomegaly or masses. No bruits. Bowel sounds normal. No guarding, tenderness or rebound.    Musculoskeletal exam: Full ROM of spine, hips , shoulders and knees. No deformity ,swelling or crepitus noted. No muscle wasting or atrophy.   Neurologic: Cranial nerves 2 to 12 intact. Power, tone ,sensation and reflexes normal throughout. No disturbance in gait. No tremor.  Skin: Intact, no ulceration, erythema , scaling or rash noted. Pigmentation normal throughout No palpable lumps / nodules in area of concern, no erythema or warmth of skin  Psych; Normal mood and affect. Judgement and concentration normal   Assessment & Plan:  Encounter for annual physical exam Annual exam as documented. Counseling done  re healthy lifestyle involving commitment  to 150 minutes exercise per week, heart healthy diet, and attaining healthy weight.The importance of adequate sleep also discussed. Regular seat belt use and home safety, is also discussed. Changes in health habits are decided on by the patient with goals and time frames  set for achieving them. Immunization and cancer screening needs are specifically addressed at this visit.   Chronic intractable headache Reports uncontrolled, more than 2 per week, refer back to Nurology for management, and update brain scan due to reported memory loss  Memory loss 6 month h/o increased forgetfulness reported by spouse and pt agrees, obtain brain scan and refer to Neurology

## 2021-12-03 NOTE — Assessment & Plan Note (Signed)

## 2021-12-03 NOTE — Assessment & Plan Note (Signed)
Reports uncontrolled, more than 2 per week, refer back to Nurology for management, and update brain scan due to reported memory loss

## 2021-12-04 LAB — CMP14+EGFR
ALT: 26 IU/L (ref 0–44)
AST: 22 IU/L (ref 0–40)
Albumin/Globulin Ratio: 2.1 (ref 1.2–2.2)
Albumin: 4.8 g/dL (ref 3.9–4.9)
Alkaline Phosphatase: 123 IU/L — ABNORMAL HIGH (ref 44–121)
BUN/Creatinine Ratio: 13 (ref 10–24)
BUN: 15 mg/dL (ref 8–27)
Bilirubin Total: 0.4 mg/dL (ref 0.0–1.2)
CO2: 21 mmol/L (ref 20–29)
Calcium: 9.7 mg/dL (ref 8.6–10.2)
Chloride: 102 mmol/L (ref 96–106)
Creatinine, Ser: 1.14 mg/dL (ref 0.76–1.27)
Globulin, Total: 2.3 g/dL (ref 1.5–4.5)
Glucose: 93 mg/dL (ref 70–99)
Potassium: 4.4 mmol/L (ref 3.5–5.2)
Sodium: 141 mmol/L (ref 134–144)
Total Protein: 7.1 g/dL (ref 6.0–8.5)
eGFR: 73 mL/min/{1.73_m2} (ref >59)

## 2021-12-04 LAB — CBC
Hematocrit: 43.5 % (ref 37.5–51.0)
Hemoglobin: 14.3 g/dL (ref 13.0–17.7)
MCH: 28.7 pg (ref 26.6–33.0)
MCHC: 32.9 g/dL (ref 31.5–35.7)
MCV: 87 fL (ref 79–97)
Platelets: 195 10*3/uL (ref 150–450)
RBC: 4.99 x10E6/uL (ref 4.14–5.80)
RDW: 14.1 % (ref 11.6–15.4)
WBC: 7.5 10*3/uL (ref 3.4–10.8)

## 2021-12-04 LAB — HEMOGLOBIN A1C
Est. average glucose Bld gHb Est-mCnc: 131 mg/dL
Hgb A1c MFr Bld: 6.2 % — ABNORMAL HIGH (ref 4.8–5.6)

## 2021-12-04 LAB — LIPID PANEL
Chol/HDL Ratio: 2.2 ratio (ref 0.0–5.0)
Cholesterol, Total: 86 mg/dL — ABNORMAL LOW (ref 100–199)
HDL: 40 mg/dL (ref >39)
LDL Chol Calc (NIH): 30 mg/dL (ref 0–99)
Triglycerides: 79 mg/dL (ref 0–149)
VLDL Cholesterol Cal: 16 mg/dL (ref 5–40)

## 2021-12-04 LAB — TSH: TSH: 1.06 u[IU]/mL (ref 0.450–4.500)

## 2021-12-07 ENCOUNTER — Other Ambulatory Visit: Payer: Self-pay | Admitting: Family Medicine

## 2021-12-18 ENCOUNTER — Other Ambulatory Visit: Payer: Self-pay | Admitting: Family Medicine

## 2021-12-21 ENCOUNTER — Other Ambulatory Visit: Payer: Self-pay | Admitting: Family Medicine

## 2021-12-22 IMAGING — CR DG LUMBAR SPINE 2-3V
1 series · 1 of 1 positions shown · non-contrast
Comparison: 07/24/2020

CLINICAL DATA: Surgical localization

EXAM:
LUMBAR SPINE - 2-3 VIEW

[lateral]
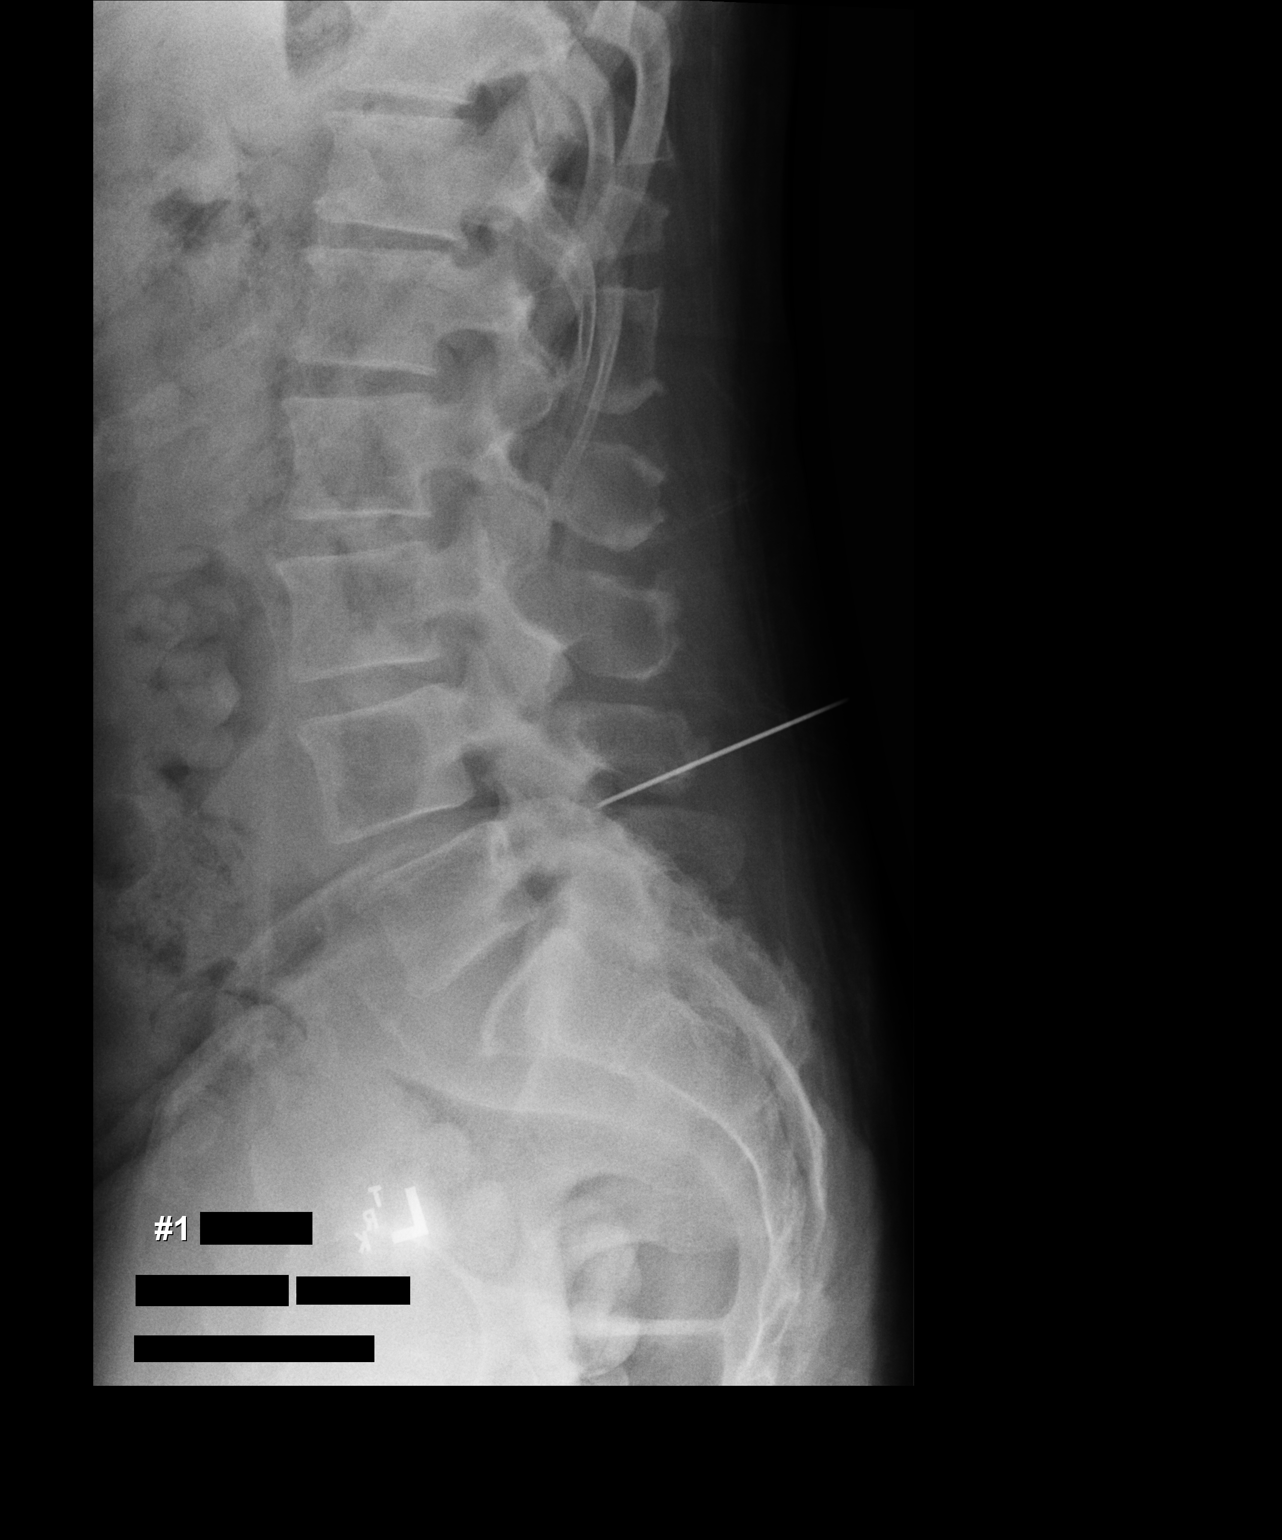

[1 of 1 positions shown; findings below may reference images not displayed]

FINDINGS: Two cross-table lateral intraoperative radiographs of the lumbar
spine were obtained. Image 1 time stamped at 2761 hours demonstrates
a surgical probe just posterior to the L4-5 facet joint level. Image
2 time stamped at 6458 hours demonstrates surgical probe at the
L5-S1 facet joint level.
IMPRESSION: Intraoperative localization, as above.

## 2021-12-28 ENCOUNTER — Ambulatory Visit (HOSPITAL_COMMUNITY)
Admission: RE | Admit: 2021-12-28 | Discharge: 2021-12-28 | Disposition: A | Discharge status: Home / Self Care | Payer: BC Managed Care – PPO | Source: Ambulatory Visit | Attending: Family Medicine | Admitting: Family Medicine

## 2021-12-28 DIAGNOSIS — R413 Other amnesia: Secondary | ICD-10-CM | POA: Diagnosis not present

## 2021-12-28 DIAGNOSIS — R519 Headache, unspecified: Secondary | ICD-10-CM | POA: Diagnosis not present

## 2021-12-28 DIAGNOSIS — R410 Disorientation, unspecified: Secondary | ICD-10-CM | POA: Diagnosis not present

## 2021-12-28 MED ORDER — GADOBUTROL 1 MMOL/ML IV SOLN
10.0000 mL | Freq: Once | INTRAVENOUS | Status: AC | PRN
  Administered 2021-12-28: 10 mL via INTRAVENOUS

## 2022-02-04 ENCOUNTER — Other Ambulatory Visit: Payer: Self-pay | Admitting: Family Medicine

## 2022-03-23 ENCOUNTER — Other Ambulatory Visit: Payer: Self-pay | Admitting: Family Medicine

## 2022-03-31 ENCOUNTER — Encounter: Payer: Self-pay | Admitting: Radiology

## 2022-04-20 ENCOUNTER — Other Ambulatory Visit: Payer: Self-pay | Admitting: Family Medicine

## 2022-05-06 ENCOUNTER — Ambulatory Visit: Payer: BC Managed Care – PPO | Admitting: Family Medicine

## 2022-05-16 ENCOUNTER — Other Ambulatory Visit: Payer: Self-pay | Admitting: Family Medicine

## 2022-05-17 ENCOUNTER — Ambulatory Visit (INDEPENDENT_AMBULATORY_CARE_PROVIDER_SITE_OTHER): Payer: BC Managed Care – PPO | Admitting: Family Medicine

## 2022-05-17 ENCOUNTER — Encounter: Payer: Self-pay | Admitting: Family Medicine

## 2022-05-17 VITALS — BP 128/69 | HR 107 | Resp 16 | Ht 70.0 in | Wt 194.0 lb

## 2022-05-17 DIAGNOSIS — E785 Hyperlipidemia, unspecified: Secondary | ICD-10-CM

## 2022-05-17 DIAGNOSIS — E1159 Type 2 diabetes mellitus with other circulatory complications: Secondary | ICD-10-CM

## 2022-05-17 DIAGNOSIS — F5104 Psychophysiologic insomnia: Secondary | ICD-10-CM

## 2022-05-17 DIAGNOSIS — M549 Dorsalgia, unspecified: Secondary | ICD-10-CM

## 2022-05-17 DIAGNOSIS — E1169 Type 2 diabetes mellitus with other specified complication: Secondary | ICD-10-CM

## 2022-05-17 DIAGNOSIS — K219 Gastro-esophageal reflux disease without esophagitis: Secondary | ICD-10-CM

## 2022-05-17 DIAGNOSIS — D126 Benign neoplasm of colon, unspecified: Secondary | ICD-10-CM

## 2022-05-17 DIAGNOSIS — E663 Overweight: Secondary | ICD-10-CM

## 2022-05-17 DIAGNOSIS — I10 Essential (primary) hypertension: Secondary | ICD-10-CM | POA: Diagnosis not present

## 2022-05-17 DIAGNOSIS — G43109 Migraine with aura, not intractable, without status migrainosus: Secondary | ICD-10-CM | POA: Diagnosis not present

## 2022-05-17 MED ORDER — ACETAMINOPHEN ER 650 MG PO TBCR: EXTENDED_RELEASE_TABLET | ORAL | 11 refills | Status: AC

## 2022-05-17 MED ORDER — TRIAMCINOLONE ACETONIDE 0.025 % EX OINT: 1.0000 | TOPICAL_OINTMENT | Freq: Two times a day (BID) | CUTANEOUS | 1 refills | Status: DC | PRN

## 2022-05-17 NOTE — Patient Instructions (Addendum)
Annual exam   with EKG  in Office first week   in October , call if you need me before  Stop daily naproxen for arthritis, new is tylenol 650 mg twice daily, take naproxen only if pain is uncontroled, maybe twice per week  HBA1C, lipid, cmp and   EGFR today  Fasting lipid, cmp and EGFr, CBC, HBA1C, CBC, TSH and vit D last week in September and microalb  I will refer you to GI re colonoscopy  Please get RSV vaccine at your pharmacy  It is important that you exercise regularly at least 30 minutes 5 times a week. If you develop chest pain, have severe difficulty breathing, or feel very tired, stop exercising immediately and seek medical attention  Thanks for choosing  [facility], we consider it a privelige to serve you.

## 2022-05-18 LAB — CMP14+EGFR
ALT: 24 IU/L (ref 0–44)
AST: 26 IU/L (ref 0–40)
Albumin/Globulin Ratio: 1.6 (ref 1.2–2.2)
Albumin: 4.6 g/dL (ref 3.9–4.9)
Alkaline Phosphatase: 139 IU/L — ABNORMAL HIGH (ref 44–121)
BUN/Creatinine Ratio: 13 (ref 10–24)
BUN: 15 mg/dL (ref 8–27)
Bilirubin Total: 0.7 mg/dL (ref 0.0–1.2)
CO2: 18 mmol/L — ABNORMAL LOW (ref 20–29)
Calcium: 9.8 mg/dL (ref 8.6–10.2)
Chloride: 107 mmol/L — ABNORMAL HIGH (ref 96–106)
Creatinine, Ser: 1.17 mg/dL (ref 0.76–1.27)
Globulin, Total: 2.8 g/dL (ref 1.5–4.5)
Glucose: 82 mg/dL (ref 70–99)
Potassium: 4.1 mmol/L (ref 3.5–5.2)
Sodium: 144 mmol/L (ref 134–144)
Total Protein: 7.4 g/dL (ref 6.0–8.5)
eGFR: 70 mL/min/{1.73_m2} (ref >59)

## 2022-05-18 LAB — LIPID PANEL
Chol/HDL Ratio: 2.5 ratio (ref 0.0–5.0)
Cholesterol, Total: 95 mg/dL — ABNORMAL LOW (ref 100–199)
HDL: 38 mg/dL — ABNORMAL LOW (ref >39)
LDL Chol Calc (NIH): 39 mg/dL (ref 0–99)
Triglycerides: 93 mg/dL (ref 0–149)
VLDL Cholesterol Cal: 18 mg/dL (ref 5–40)

## 2022-05-18 LAB — HEMOGLOBIN A1C
Est. average glucose Bld gHb Est-mCnc: 126 mg/dL
Hgb A1c MFr Bld: 6 % — ABNORMAL HIGH (ref 4.8–5.6)

## 2022-05-23 ENCOUNTER — Encounter: Payer: Self-pay | Admitting: Family Medicine

## 2022-05-23 MED ORDER — TEMAZEPAM 30 MG PO CAPS: 30.0000 mg | ORAL_CAPSULE | Freq: Every day | ORAL | 5 refills | Status: DC

## 2022-05-23 NOTE — Assessment & Plan Note (Signed)
Controlled, no change in medication  

## 2022-05-23 NOTE — Assessment & Plan Note (Signed)
Reduce dependence on NSAID, use tylenol as mainstay of management

## 2022-05-23 NOTE — Assessment & Plan Note (Signed)
Controlled, no change in medication DASH diet and commitment to daily physical activity for a minimum of 30 minutes discussed and encouraged, as a part of hypertension management. The importance of attaining a healthy weight is also discussed.     05/17/2022    1:06 PM 12/03/2021    8:34 AM 08/11/2021    8:39 AM 08/11/2021    8:30 AM 06/16/2021   10:57 AM 06/16/2021   10:21 AM 01/13/2021   10:17 AM  BP/Weight  Systolic BP 128 120 110 142 100 99 130  Diastolic BP 69 74 70 80 70 65 78  Wt. (Lbs) 194 204.04  200.04  196.12 208.12  BMI 27.84 kg/m2 29.28 kg/m2  28.7 kg/m2  28.14 kg/m2 29.86 kg/m2

## 2022-05-23 NOTE — Progress Notes (Signed)
Bryan Rodgers     MRN: [id number]      DOB: 06/13/1960   HPI Bryan Rodgers is here for follow up and re-evaluation of chronic medical conditions, medication management and review of any available recent lab and radiology data.  Preventive health is updated, specifically  Cancer screening and Immunization.   Questions or concerns regarding consultations or procedures which the PT has had in the interim are  addressed. The PT denies any adverse reactions to current medications since the last visit.  There are no new concerns.  There are no specific complaints   ROS Denies recent fever or chills. Denies sinus pressure, nasal congestion, ear pain or sore throat. Denies chest congestion, productive cough or wheezing. Denies chest pains, palpitations and leg swelling Denies abdominal pain, nausea, vomiting,diarrhea or constipation.   Denies dysuria, frequency, hesitancy or incontinence. Denies uncontrolled joint pain, swelling and limitation in mobility. Denies headaches, seizures, numbness, or tingling. Denies depression, anxiety or insomnia. Denies skin break down or rash.   PE  BP 128/69   Pulse (!) 107   Resp 16   Ht  (1.778 m)   Wt 194 lb (88 kg)   SpO2 94%   BMI 27.84 kg/m   Patient alert and oriented and in no cardiopulmonary distress.  HEENT: No facial asymmetry, EOMI,     Neck supple .  Chest: Clear to auscultation bilaterally.  CVS: S1, S2 no murmurs, no S3.Regular rate.  ABD: Soft non tender.   Ext: No edema  MS: Adequate ROM spine, shoulders, hips and knees.  Skin: Intact, no ulcerations or rash noted.  Psych: Good eye contact, normal affect. Memory intact not anxious or depressed appearing.  CNS: CN 2-12 intact, power,  normal throughout.no focal deficits noted.   Assessment & Plan  HTN (hypertension) Controlled, no change in medication DASH diet and commitment to daily physical activity for a minimum of 30 minutes discussed and encouraged,  as a part of hypertension management. The importance of attaining a healthy weight is also discussed.     05/17/2022    1:06 PM 12/03/2021    8:34 AM 08/11/2021    8:39 AM 08/11/2021    8:30 AM 06/16/2021   10:57 AM 06/16/2021   10:21 AM 01/13/2021   10:17 AM  BP/Weight  Systolic BP 128 120 110 142 100 99 130  Diastolic BP 69 74 70 80 70 65 78  Wt. (Lbs) 194 204.04  200.04  196.12 208.12  BMI 27.84 kg/m2 29.28 kg/m2  28.7 kg/m2  28.14 kg/m2 29.86 kg/m2       Type 2 diabetes mellitus with vascular disease [facility]) Bryan Rodgers is reminded of the importance of commitment to daily physical activity for 30 minutes or more, as able and the need to limit carbohydrate intake to 30 to 60 grams per meal to help with blood sugar control.   The need to take medication as prescribed, test blood sugar as directed, and to call between visits if there is a concern that blood sugar is uncontrolled is also discussed.   Bryan Rodgers is reminded of the importance of daily foot exam, annual eye examination, and good blood sugar, blood pressure and cholesterol control.     Latest Ref Rng & Units 05/17/2022    1:48 PM 12/03/2021    9:55 AM 08/11/2021    9:21 AM 06/16/2021   11:11 AM 01/13/2021   11:09 AM  Diabetic Labs  HbA1c 4.8 - 5.6 % 6.0  6.2  6.1  6.4   Micro/Creat Ratio 0 - 29 mg/g creat   14     Chol 100 - 199 mg/dL 95  86   308    HDL >65 mg/dL 38  40   36    Calc LDL 0 - 99 mg/dL 39  30   56    Triglycerides 0 - 149 mg/dL 93  79   784    Creatinine 0.76 - 1.27 mg/dL 6.96  2.95   2.84        05/17/2022    1:06 PM 12/03/2021    8:34 AM 08/11/2021    8:39 AM 08/11/2021    8:30 AM 06/16/2021   10:57 AM 06/16/2021   10:21 AM 01/13/2021   10:17 AM  BP/Weight  Systolic BP 128 120 110 142 100 99 130  Diastolic BP 69 74 70 80 70 65 78  Wt. (Lbs) 194 204.04  200.04  196.12 208.12  BMI 27.84 kg/m2 29.28 kg/m2  28.7 kg/m2  28.14 kg/m2 29.86 kg/m2      Latest Ref Rng & Units 01/13/2021   10:00 AM  06/26/2020   12:00 AM  Foot/eye exam completion dates  Eye Exam No Retinopathy  No Retinopathy      Foot Form Completion  Done      This result is from an external source.      Controlled, no change in medication   Migraine headache Controlled, no change in medication   GERD (gastroesophageal reflux disease) Controlled, no change in medication   Hyperlipidemia associated with type 2 diabetes mellitus ([facility]) Hyperlipidemia:Low fat diet discussed and encouraged.   Lipid Panel  Lab Results  Component Value Date   CHOL 95 (L) 05/17/2022   HDL 38 (L) 05/17/2022   LDLCALC 39 05/17/2022   LDLDIRECT 48 07/08/2010   TRIG 93 05/17/2022   CHOLHDL 2.5 05/17/2022     Needs to inc exercise commit,ment Updated lab needed at/ before next visit.   Overweight (BMI 25.0-29.9)  Patient re-educated about  the importance of commitment to a  minimum of 150 minutes of exercise per week as able.  The importance of healthy food choices with portion control discussed, as well as eating regularly and within a 12 hour window most days. The need to choose "clean , green" food 50 to 75% of the time is discussed, as well as to make water the primary drink and set a goal of 64 ounces water daily.       05/17/2022    1:06 PM 12/03/2021    8:34 AM 08/11/2021    8:30 AM  Weight /BMI  Weight 194 lb 204 lb 0.6 oz 200 lb 0.6 oz  Height  (1.778 m)  (1.778 m)  (1.778 m)  BMI 27.84 kg/m2 29.28 kg/m2 28.7 kg/m2    Improved significantly which is great  Insomnia Sleep hygiene reviewed and written information offered also. Prescription sent for  medication needed.   Back pain with radiation Reduce dependence on NSAID, use tylenol as mainstay of management

## 2022-05-23 NOTE — Assessment & Plan Note (Signed)
  Patient re-educated about  the importance of commitment to a  minimum of 150 minutes of exercise per week as able.  The importance of healthy food choices with portion control discussed, as well as eating regularly and within a 12 hour window most days. The need to choose "clean , green" food 50 to 75% of the time is discussed, as well as to make water the primary drink and set a goal of 64 ounces water daily.       05/17/2022    1:06 PM 12/03/2021    8:34 AM 08/11/2021    8:30 AM  Weight /BMI  Weight 194 lb 204 lb 0.6 oz 200 lb 0.6 oz  Height  (1.778 m)  (1.778 m)  (1.778 m)  BMI 27.84 kg/m2 29.28 kg/m2 28.7 kg/m2    Improved significantly which is great

## 2022-05-23 NOTE — Assessment & Plan Note (Signed)
Hyperlipidemia:Low fat diet discussed and encouraged.   Lipid Panel  Lab Results  Component Value Date   CHOL 95 (L) 05/17/2022   HDL 38 (L) 05/17/2022   LDLCALC 39 05/17/2022   LDLDIRECT 48 07/08/2010   TRIG 93 05/17/2022   CHOLHDL 2.5 05/17/2022     Needs to inc exercise commit,ment Updated lab needed at/ before next visit.

## 2022-05-23 NOTE — Assessment & Plan Note (Signed)
Sleep hygiene reviewed and written information offered also. Prescription sent for  medication needed.  

## 2022-05-23 NOTE — Assessment & Plan Note (Signed)
Bryan Rodgers is reminded of the importance of commitment to daily physical activity for 30 minutes or more, as able and the need to limit carbohydrate intake to 30 to 60 grams per meal to help with blood sugar control.   The need to take medication as prescribed, test blood sugar as directed, and to call between visits if there is a concern that blood sugar is uncontrolled is also discussed.   Bryan Rodgers is reminded of the importance of daily foot exam, annual eye examination, and good blood sugar, blood pressure and cholesterol control.     Latest Ref Rng & Units 05/17/2022    1:48 PM 12/03/2021    9:55 AM 08/11/2021    9:21 AM 06/16/2021   11:11 AM 01/13/2021   11:09 AM  Diabetic Labs  HbA1c 4.8 - 5.6 % 6.0  6.2   6.1  6.4   Micro/Creat Ratio 0 - 29 mg/g creat   14     Chol 100 - 199 mg/dL 95  86   161    HDL >09 mg/dL 38  40   36    Calc LDL 0 - 99 mg/dL 39  30   56    Triglycerides 0 - 149 mg/dL 93  79   604    Creatinine 0.76 - 1.27 mg/dL 5.40  9.81   1.91        05/17/2022    1:06 PM 12/03/2021    8:34 AM 08/11/2021    8:39 AM 08/11/2021    8:30 AM 06/16/2021   10:57 AM 06/16/2021   10:21 AM 01/13/2021   10:17 AM  BP/Weight  Systolic BP 128 120 110 142 100 99 130  Diastolic BP 69 74 70 80 70 65 78  Wt. (Lbs) 194 204.04  200.04  196.12 208.12  BMI 27.84 kg/m2 29.28 kg/m2  28.7 kg/m2  28.14 kg/m2 29.86 kg/m2      Latest Ref Rng & Units 01/13/2021   10:00 AM 06/26/2020   12:00 AM  Foot/eye exam completion dates  Eye Exam No Retinopathy  No Retinopathy      Foot Form Completion  Done      This result is from an external source.      Controlled, no change in medication

## 2022-05-24 ENCOUNTER — Encounter: Payer: Self-pay | Admitting: *Deleted

## 2022-06-14 ENCOUNTER — Other Ambulatory Visit: Payer: Self-pay | Admitting: Family Medicine

## 2022-06-23 ENCOUNTER — Other Ambulatory Visit: Payer: Self-pay | Admitting: Family Medicine

## 2022-06-27 ENCOUNTER — Other Ambulatory Visit: Payer: Self-pay | Admitting: Family Medicine

## 2022-07-04 ENCOUNTER — Other Ambulatory Visit: Payer: Self-pay | Admitting: Family Medicine

## 2022-07-15 ENCOUNTER — Other Ambulatory Visit: Payer: Self-pay | Admitting: Family Medicine

## 2022-08-21 ENCOUNTER — Other Ambulatory Visit: Payer: Self-pay | Admitting: Family Medicine

## 2022-08-25 ENCOUNTER — Other Ambulatory Visit: Payer: Self-pay | Admitting: Family Medicine

## 2022-08-29 ENCOUNTER — Other Ambulatory Visit: Payer: Self-pay | Admitting: Family Medicine

## 2022-08-31 ENCOUNTER — Telehealth: Payer: Self-pay | Admitting: Family Medicine

## 2022-08-31 NOTE — Telephone Encounter (Signed)
I don't see naproxen on his med list. Please advise

## 2022-08-31 NOTE — Telephone Encounter (Signed)
Melissa called from [facility], Naproxen got denied saying not approximate needs to know what exactly does this mean. Call back  #[phone].

## 2022-09-02 ENCOUNTER — Other Ambulatory Visit: Payer: Self-pay | Admitting: Family Medicine

## 2022-09-02 MED ORDER — NAPROXEN 500 MG PO TABS: ORAL_TABLET | ORAL | 1 refills | Status: DC

## 2022-10-05 ENCOUNTER — Ambulatory Visit (INDEPENDENT_AMBULATORY_CARE_PROVIDER_SITE_OTHER): Payer: BC Managed Care – PPO

## 2022-10-05 DIAGNOSIS — Z23 Encounter for immunization: Secondary | ICD-10-CM | POA: Diagnosis not present

## 2022-10-18 ENCOUNTER — Other Ambulatory Visit: Payer: Self-pay | Admitting: Family Medicine

## 2022-10-19 ENCOUNTER — Other Ambulatory Visit: Payer: Self-pay | Admitting: Family Medicine

## 2022-10-31 ENCOUNTER — Other Ambulatory Visit: Payer: Self-pay | Admitting: Family Medicine

## 2022-11-01 ENCOUNTER — Ambulatory Visit: Payer: BC Managed Care – PPO | Admitting: Family Medicine

## 2022-11-15 ENCOUNTER — Other Ambulatory Visit: Payer: Self-pay | Admitting: Family Medicine

## 2022-11-21 ENCOUNTER — Other Ambulatory Visit: Payer: Self-pay | Admitting: Family Medicine

## 2022-11-23 ENCOUNTER — Encounter: Payer: Self-pay | Admitting: *Deleted

## 2022-11-30 ENCOUNTER — Telehealth: Payer: Self-pay | Admitting: Family Medicine

## 2022-11-30 ENCOUNTER — Other Ambulatory Visit: Payer: Self-pay | Admitting: Family Medicine

## 2022-11-30 MED ORDER — TEMAZEPAM 30 MG PO CAPS: 30.0000 mg | ORAL_CAPSULE | Freq: Every day | ORAL | 0 refills | Status: DC

## 2022-11-30 NOTE — Telephone Encounter (Signed)
Prescription Request  11/30/2022  LOV: 05/17/2022  What is the name of the medication or equipment? temazepam (RESTORIL) 30 MG capsule [[id number]]    Have you contacted your pharmacy to request a refill? Yes   Which pharmacy would you like this sent to?  [facility] - [place], [place] - [address] [place] [address] Phone: [phone] Fax: [phone]      Patient notified that their request is being sent to the clinical staff for review and that they should receive a response within 2 business days.   Please advise at Mobile [phone] (mobile)

## 2022-11-30 NOTE — Telephone Encounter (Signed)
Requests refill of temazepam ?

## 2022-12-08 ENCOUNTER — Other Ambulatory Visit: Payer: Self-pay | Admitting: Family Medicine

## 2022-12-08 ENCOUNTER — Encounter: Payer: Self-pay | Admitting: Family Medicine

## 2022-12-08 ENCOUNTER — Ambulatory Visit (INDEPENDENT_AMBULATORY_CARE_PROVIDER_SITE_OTHER): Payer: BC Managed Care – PPO | Admitting: Family Medicine

## 2022-12-08 VITALS — BP 130/66 | HR 80 | Ht 70.0 in | Wt 190.0 lb

## 2022-12-08 DIAGNOSIS — M5416 Radiculopathy, lumbar region: Secondary | ICD-10-CM

## 2022-12-08 DIAGNOSIS — R748 Abnormal levels of other serum enzymes: Secondary | ICD-10-CM

## 2022-12-08 DIAGNOSIS — I10 Essential (primary) hypertension: Secondary | ICD-10-CM

## 2022-12-08 DIAGNOSIS — Z0001 Encounter for general adult medical examination with abnormal findings: Secondary | ICD-10-CM | POA: Diagnosis not present

## 2022-12-08 DIAGNOSIS — E785 Hyperlipidemia, unspecified: Secondary | ICD-10-CM | POA: Diagnosis not present

## 2022-12-08 DIAGNOSIS — E1159 Type 2 diabetes mellitus with other circulatory complications: Secondary | ICD-10-CM

## 2022-12-08 DIAGNOSIS — M549 Dorsalgia, unspecified: Secondary | ICD-10-CM

## 2022-12-08 DIAGNOSIS — E1169 Type 2 diabetes mellitus with other specified complication: Secondary | ICD-10-CM | POA: Diagnosis not present

## 2022-12-08 DIAGNOSIS — E559 Vitamin D deficiency, unspecified: Secondary | ICD-10-CM

## 2022-12-08 MED ORDER — CYCLOBENZAPRINE HCL 10 MG PO TABS: ORAL_TABLET | ORAL | 1 refills | Status: DC

## 2022-12-08 MED ORDER — ALBUTEROL SULFATE HFA 108 (90 BASE) MCG/ACT IN AERS: 2.0000 | INHALATION_SPRAY | Freq: Four times a day (QID) | RESPIRATORY_TRACT | 2 refills | Status: DC | PRN

## 2022-12-08 NOTE — Patient Instructions (Addendum)
F/U in 4 months, call if you need me sooner  Labs today, CBC, lipid, cmp and EGFr, hBa1c, TSH and vit d and urine aCR  You will be referred to Specialist re back pain   Meds as discussed and listed,   Thanks for choosing  Primary Care, we consider it a privelige to serve you.

## 2022-12-10 LAB — VITAMIN D 25 HYDROXY (VIT D DEFICIENCY, FRACTURES): Vit D, 25-Hydroxy: 28.6 ng/mL — ABNORMAL LOW (ref 30.0–100.0)

## 2022-12-10 LAB — CBC
Hematocrit: 43.9 % (ref 37.5–51.0)
Hemoglobin: 14.3 g/dL (ref 13.0–17.7)
MCH: 29.4 pg (ref 26.6–33.0)
MCHC: 32.6 g/dL (ref 31.5–35.7)
MCV: 90 fL (ref 79–97)
Platelets: 220 10*3/uL (ref 150–450)
RBC: 4.86 x10E6/uL (ref 4.14–5.80)
RDW: 13.8 % (ref 11.6–15.4)
WBC: 8 10*3/uL (ref 3.4–10.8)

## 2022-12-10 LAB — CMP14+EGFR
ALT: 19 [IU]/L (ref 0–44)
AST: 26 IU/L (ref 0–40)
Albumin: 4.9 g/dL (ref 3.9–4.9)
Alkaline Phosphatase: 146 IU/L — ABNORMAL HIGH (ref 44–121)
BUN/Creatinine Ratio: 11 (ref 10–24)
BUN: 10 mg/dL (ref 8–27)
Bilirubin Total: 0.6 mg/dL (ref 0.0–1.2)
CO2: 24 mmol/L (ref 20–29)
Calcium: 9.6 mg/dL (ref 8.6–10.2)
Chloride: 103 mmol/L (ref 96–106)
Creatinine, Ser: 0.95 mg/dL (ref 0.76–1.27)
Globulin, Total: 2.9 g/dL (ref 1.5–4.5)
Glucose: 86 mg/dL (ref 70–99)
Potassium: 4.1 mmol/L (ref 3.5–5.2)
Sodium: 140 mmol/L (ref 134–144)
Total Protein: 7.8 g/dL (ref 6.0–8.5)
eGFR: 90 mL/min/{1.73_m2} (ref >59)

## 2022-12-10 LAB — LIPID PANEL
Chol/HDL Ratio: 1.8 ratio (ref 0.0–5.0)
Cholesterol, Total: 94 mg/dL — ABNORMAL LOW (ref 100–199)
HDL: 51 mg/dL (ref >39)
LDL Chol Calc (NIH): 30 mg/dL (ref 0–99)
Triglycerides: 52 mg/dL (ref 0–149)
VLDL Cholesterol Cal: 13 mg/dL (ref 5–40)

## 2022-12-10 LAB — MICROALBUMIN / CREATININE URINE RATIO
Creatinine, Urine: 110.2 mg/dL
Microalb/Creat Ratio: 50 mg/g{creat} — ABNORMAL HIGH (ref 0–29)
Microalbumin, Urine: 55.3 ug/mL

## 2022-12-10 LAB — TSH: TSH: 0.84 u[IU]/mL (ref 0.450–4.500)

## 2022-12-10 LAB — HEMOGLOBIN A1C
Est. average glucose Bld gHb Est-mCnc: 128 mg/dL
Hgb A1c MFr Bld: 6.1 % — ABNORMAL HIGH (ref 4.8–5.6)

## 2022-12-11 NOTE — Progress Notes (Signed)
Bryan Rodgers     MRN: [id number]      DOB: September 07, 1960  Chief Complaint  Patient presents with   Annual Exam    HPI: Patient is in for annual physical exam. C/o uncontrolled back pain, wants help ,has ahd surgery in the past Recent labs, if available are reviewed. Immunization is reviewed , and  updated if needed.    PE; BP 130/66 (BP Location: Right Arm, Patient Position: Sitting, Cuff Size: Large)   Pulse 80   Ht 5\' 10"  (1.778 m)   Wt 190 lb (86.2 kg)   SpO2 96%   BMI 27.26 kg/m   Pleasant male, alert and oriented x 3, in no cardio-pulmonary distress. Afebrile. HEENT No facial trauma or asymetry. Sinuses non tender. EOMI External ears normal,  Neck: supple, no adenopathy,JVD or thyromegaly.No bruits.  Chest: Clear to ascultation bilaterally.No crackles or wheezes. Non tender to palpation  Cardiovascular system; Heart sounds normal,  S1 and  S2 ,no S3.  No murmur, or thrill. Apical beat not displaced Peripheral pulses normal.  Abdomen: Soft, non tender, no organomegaly or masses. No bruits. Bowel sounds normal. No guarding, tenderness or rebound.    Musculoskeletal exam: Decreased  ROM of lumbar spine,adequate in  hips ,  decrease in shoulders and adequate in knees. No deformity ,swelling or crepitus noted. No muscle wasting or atrophy.   Neurologic: Cranial nerves 2 to 12 intact. Power, tone ,sensation and reflexes normal throughout. No disturbance in gait. No tremor.  Skin: Intact, no ulceration, erythema , scaling or rash noted. Pigmentation normal throughout  Psych; Normal mood and affect. Judgement and concentration normal   Assessment & Plan:  Type 2 diabetes mellitus with vascular disease [facility]) Bryan Rodgers is reminded of the importance of commitment to daily physical activity for 30 minutes or more, as able and the need to limit carbohydrate intake to 30 to 60 grams per meal to help with blood sugar control.   The need to take  medication as prescribed, test blood sugar as directed, and to call between visits if there is a concern that blood sugar is uncontrolled is also discussed.   Bryan Rodgers is reminded of the importance of daily foot exam, annual eye examination, and good blood sugar, blood pressure and cholesterol control.     Latest Ref Rng & Units 12/08/2022    3:00 PM 05/17/2022    1:48 PM 12/03/2021    9:55 AM 08/11/2021    9:21 AM 06/16/2021   11:11 AM  Diabetic Labs  HbA1c 4.8 - 5.6 % 6.1  6.0  6.2   6.1   Micro/Creat Ratio 0 - 29 mg/g creat 50    14    Chol 100 - 199 mg/dL 94  95  86   010   HDL >39 mg/dL 51  38  40   36   Calc LDL 0 - 99 mg/dL 30  39  30   56   Triglycerides 0 - 149 mg/dL 52  93  79   932   Creatinine 0.76 - 1.27 mg/dL 3.55  7.32  2.02   5.42       12/08/2022    1:57 PM 05/17/2022    1:06 PM 12/03/2021    8:34 AM 08/11/2021    8:39 AM 08/11/2021    8:30 AM 06/16/2021   10:57 AM 06/16/2021   10:21 AM  BP/Weight  Systolic BP 130 128 120 110 142 100 99  Diastolic BP 66 69  74 70 80 70 65  Wt. (Lbs) 190 194 204.04  200.04  196.12  BMI 27.26 kg/m2 27.84 kg/m2 29.28 kg/m2  28.7 kg/m2  28.14 kg/m2      Latest Ref Rng & Units 01/13/2021   10:00 AM 06/26/2020   12:00 AM  Foot/eye exam completion dates  Eye Exam No Retinopathy  No Retinopathy      Foot Form Completion  Done      This result is from an external source.        Lumbar radiculopathy Increased pain radiaitin to leg inpast 2 to 3 monhts, needs eval  Back pain with radiation Inmcreased in past several months, ortho to evaluate

## 2022-12-11 NOTE — Assessment & Plan Note (Signed)
Mr. Iffland is reminded of the importance of commitment to daily physical activity for 30 minutes or more, as able and the need to limit carbohydrate intake to 30 to 60 grams per meal to help with blood sugar control.   The need to take medication as prescribed, test blood sugar as directed, and to call between visits if there is a concern that blood sugar is uncontrolled is also discussed.   Mr. Gabourel is reminded of the importance of daily foot exam, annual eye examination, and good blood sugar, blood pressure and cholesterol control.     Latest Ref Rng & Units 12/08/2022    3:00 PM 05/17/2022    1:48 PM 12/03/2021    9:55 AM 08/11/2021    9:21 AM 06/16/2021   11:11 AM  Diabetic Labs  HbA1c 4.8 - 5.6 % 6.1  6.0  6.2   6.1   Micro/Creat Ratio 0 - 29 mg/g creat 50    14    Chol 100 - 199 mg/dL 94  95  86   161   HDL >39 mg/dL 51  38  40   36   Calc LDL 0 - 99 mg/dL 30  39  30   56   Triglycerides 0 - 149 mg/dL 52  93  79   096   Creatinine 0.76 - 1.27 mg/dL 0.45  4.09  8.11   9.14       12/08/2022    1:57 PM 05/17/2022    1:06 PM 12/03/2021    8:34 AM 08/11/2021    8:39 AM 08/11/2021    8:30 AM 06/16/2021   10:57 AM 06/16/2021   10:21 AM  BP/Weight  Systolic BP 130 128 120 110 142 100 99  Diastolic BP 66 69 74 70 80 70 65  Wt. (Lbs) 190 194 204.04  200.04  196.12  BMI 27.26 kg/m2 27.84 kg/m2 29.28 kg/m2  28.7 kg/m2  28.14 kg/m2      Latest Ref Rng & Units 01/13/2021   10:00 AM 06/26/2020   12:00 AM  Foot/eye exam completion dates  Eye Exam No Retinopathy  No Retinopathy      Foot Form Completion  Done      This result is from an external source.

## 2022-12-12 NOTE — Assessment & Plan Note (Signed)
Inmcreased in past several months, ortho to evaluate

## 2022-12-12 NOTE — Assessment & Plan Note (Signed)
Increased pain radiaitin to leg inpast 2 to 3 monhts, needs eval

## 2022-12-20 ENCOUNTER — Ambulatory Visit (HOSPITAL_COMMUNITY)
Admission: RE | Admit: 2022-12-20 | Discharge: 2022-12-20 | Disposition: A | Discharge status: Home / Self Care | Payer: BC Managed Care – PPO | Source: Ambulatory Visit | Attending: Family Medicine | Admitting: Family Medicine

## 2022-12-20 DIAGNOSIS — R932 Abnormal findings on diagnostic imaging of liver and biliary tract: Secondary | ICD-10-CM | POA: Diagnosis not present

## 2022-12-20 DIAGNOSIS — R748 Abnormal levels of other serum enzymes: Secondary | ICD-10-CM | POA: Diagnosis not present

## 2022-12-21 ENCOUNTER — Encounter: Payer: Self-pay | Admitting: Orthopaedic Surgery

## 2022-12-21 ENCOUNTER — Encounter: Payer: Self-pay | Admitting: Gastroenterology

## 2022-12-21 ENCOUNTER — Ambulatory Visit: Payer: BC Managed Care – PPO | Admitting: Orthopaedic Surgery

## 2022-12-21 ENCOUNTER — Encounter: Payer: Self-pay | Admitting: *Deleted

## 2022-12-21 ENCOUNTER — Ambulatory Visit: Payer: BC Managed Care – PPO | Admitting: Gastroenterology

## 2022-12-21 ENCOUNTER — Other Ambulatory Visit: Payer: Self-pay | Admitting: *Deleted

## 2022-12-21 ENCOUNTER — Telehealth: Payer: Self-pay | Admitting: *Deleted

## 2022-12-21 VITALS — BP 120/64 | HR 80 | Ht 70.0 in | Wt 190.0 lb

## 2022-12-21 VITALS — BP 130/70 | HR 102 | Temp 98.4°F | Ht 70.0 in | Wt 190.4 lb

## 2022-12-21 DIAGNOSIS — K219 Gastro-esophageal reflux disease without esophagitis: Secondary | ICD-10-CM | POA: Diagnosis not present

## 2022-12-21 DIAGNOSIS — F1091 Alcohol use, unspecified, in remission: Secondary | ICD-10-CM | POA: Diagnosis not present

## 2022-12-21 DIAGNOSIS — G8929 Other chronic pain: Secondary | ICD-10-CM | POA: Insufficient documentation

## 2022-12-21 DIAGNOSIS — K760 Fatty (change of) liver, not elsewhere classified: Secondary | ICD-10-CM | POA: Diagnosis not present

## 2022-12-21 DIAGNOSIS — Z860101 Personal history of adenomatous and serrated colon polyps: Secondary | ICD-10-CM

## 2022-12-21 DIAGNOSIS — M545 Low back pain, unspecified: Secondary | ICD-10-CM

## 2022-12-21 DIAGNOSIS — R748 Abnormal levels of other serum enzymes: Secondary | ICD-10-CM | POA: Diagnosis not present

## 2022-12-21 DIAGNOSIS — Z860100 Personal history of colon polyps, unspecified: Secondary | ICD-10-CM | POA: Insufficient documentation

## 2022-12-21 MED ORDER — PREDNISONE 5 MG (21) PO TBPK: ORAL_TABLET | ORAL | 0 refills | Status: DC

## 2022-12-21 MED ORDER — TRAMADOL HCL 50 MG PO TABS: 50.0000 mg | ORAL_TABLET | Freq: Four times a day (QID) | ORAL | 0 refills | Status: AC | PRN

## 2022-12-21 MED ORDER — PEG 3350-KCL-NA BICARB-NACL 420 G PO SOLR: 4000.0000 mL | Freq: Once | ORAL | 0 refills | Status: AC

## 2022-12-21 NOTE — Telephone Encounter (Signed)
Per [facility], no PA required

## 2022-12-21 NOTE — Addendum Note (Signed)
Addended by: Michaele Offer on: 12/21/2022 02:10 PM   Modules accepted: Orders

## 2022-12-21 NOTE — Patient Instructions (Signed)
We are ordering more blood work to see why the one liver number is elevated. You do have small amount of fatty liver, which is common. Continue to make healthy food choices, avoid alcohol, work on weight loss, blood sugar control, and lipid control as you are doing!   We are arranging a colonoscopy and upper endoscopy with Dr. Marletta Lor.  You will need to hold your Ozempic for one week prior to the procedures.  We will see you in follow-up thereafter!  It was a pleasure to see you today. I want to create trusting relationships with patients and provide genuine, compassionate, and quality care. I truly value your feedback, so please be on the lookout for a survey regarding your visit with me today. I appreciate your time in completing this!         Gelene Mink, PhD, ANP-BC [facility]

## 2022-12-21 NOTE — Addendum Note (Signed)
Addended by: Michaele Offer on: 12/21/2022 01:27 PM   Modules accepted: Orders

## 2022-12-21 NOTE — Progress Notes (Signed)
My back is hurting.  He had a cyst and nerve impingement and post surgery of the lumbar spine in 2022. He had done well until recently.  His wife had abdominal surgery in last August of this year.  When she came home, he was the primary caregiver and had to lift her up to the toilet and from the bed multiple times for several weeks.  She is doing well now.  However, his lower back started hurting and has been getting slowly worse.  He has tried a back brace, a back vibrator, a special chair, heat/ice.  He cannot take NSAIDs.  He saw Dr. Lodema Hong 12-08-22 for this.  I have reviewed her notes.  He is taking Flexeril which helps.  He is only slightly better.  He is tired of hurting.  Lower back is tight but no spasm, NV intact, DTRs normal, SLR negative, ROM is tender to the right and extension.  Flexion is good.  Gait is good.  Muscle tone and strength is good.  Encounter Diagnosis  Name Primary?   Lumbar pain Yes   I will begin PT.  I will give Toradol for pain as well as prednisone dose pack.  Return in two weeks.  I think he strained his back helping his wife.  I have deferred X-rays today.  He may need MRI if not improved.  Call if any problem.  Precautions discussed.  Electronically Signed Darreld Mclean, MD 11/20/202411:14 AM

## 2022-12-21 NOTE — Progress Notes (Signed)
Gastroenterology Office Note    Referring Provider: Kerri Perches, MD Primary Care Physician:  Kerri Perches, MD  Primary GI: Dr. Marletta Lor    Chief Complaint   Chief Complaint  Patient presents with   New Patient (Initial Visit)    Patient here today due to some issues with Elevated alkaline phosphatase. Patient says he has had some mid to upper right abdominal pain.  Patient had an abdominal xray 12/20/2022, IMPRESSION: 1. Increased hepatic parenchymal echogenicity suggestive of steatosis. 2. No cholelithiasis or sonographic evidence for acute cholecystitis.          History of Present Illness   Bryan Rodgers is a 62 y.o. male presenting today at the request of Kerri Perches, MD due to chronically elevated alk phos.    Alk Phos chronically mildly elevated dating back several years.   RUQ [place] yesterday suggestive of hepatic steatosis, no gallstones. CBD 2.9 mm. ` Ozempic earlier this year. Has lost from 214 down to 190. No other pain. No constipation or diarrhea.   No ETOH. Quit in 2014. No IV drug use. Mom with liver disease due to alcohol.   Takes omeprazole once daily. No dysphagia.   Monday had RUQ abdominal pain. 7pm. Had not eaten recently. Takes ozempic. Hasn't happened since then.   Colonoscopy 2018: internal hemorrhoids, four polyps removed, one simple adenoma, one hyperplastic, two benign polypoid lesions. Surveillance now.   Past Medical History:  Diagnosis Date   Alcoholic (HCC) quit 2014   10-15 beers per day on the weekends    Anemia    Arthritis    Asthma    childhood asthma up to age 35   Chest pain    Palpations   Dyspnea    Elevated CPK    GERD (gastroesophageal reflux disease)    Hallucination    Headache    Hemorrhoids    with bleeding    History of colonic polyps    removed via colonscope   Hypertension    Kidney stone    Migraine    Neuromuscular disorder (HCC)    Overweight (BMI 25.0-29.9)    Pneumonia     Prediabetes 10/2012   HBA1C is 6.4   Tobacco abuse     Past Surgical History:  Procedure Laterality Date   CATARACT EXTRACTION W/ INTRAOCULAR LENS IMPLANT Bilateral 2012   COLONOSCOPY  2007   Dr. Darrick Penna: internal hemorrhoids, otherwise normal   COLONOSCOPY  2004   reported history of colon polyp   COLONOSCOPY WITH PROPOFOL N/A 07/26/2016   Procedure: COLONOSCOPY WITH PROPOFOL;  Surgeon: West Bali, MD;  Location: [facility];  Service: Endoscopy;  Laterality: N/A;  830    hemmorrhoidectomy  2002   LUMBAR LAMINECTOMY/DECOMPRESSION MICRODISCECTOMY N/A 08/27/2020   Procedure: EXTRAFORAMINAL DECOMPRESSION RIGHT LUMBAR FIVE-SACRAL ONE, RESECT SYNOVIAL CYST;  Surgeon: Lisbeth Renshaw, MD;  Location: [facility] OR;  Service: Neurosurgery;  Laterality: N/A;   POLYPECTOMY  07/26/2016   Procedure: POLYPECTOMY;  Surgeon: West Bali, MD;  Location: [facility];  Service: Endoscopy;;  splenic flexure polyps x4    Current Outpatient Medications  Medication Sig Dispense Refill   acetaminophen (TYLENOL 8 HOUR) 650 MG CR tablet Take one tablet by mouth  one to two times daily for arthritis pain 60 tablet 11   albuterol (VENTOLIN HFA) 108 (90 Base) MCG/ACT inhaler Inhale 2 puffs into the lungs every 6 (six) hours as needed for wheezing or shortness of breath. 6.7 g 2  amLODipine-olmesartan (AZOR) 5-20 MG tablet TAKE ONE TABLET BY MOUTH EVERY DAY 30 tablet 6   aspirin EC 81 MG tablet Take 1 tablet (81 mg total) by mouth at bedtime. 30 tablet 11   blood glucose meter kit and supplies Dispense based on patient and insurance preference. Once daily testing DX E11.9 1 each 0   cyclobenzaprine (FLEXERIL) 10 MG tablet Take one tablet by mouth once weekly ,as needed, for back spasm 24 tablet 1   glucose blood test strip Use as instructed once daily dx e11.9 50 each 11   lamoTRIgine (LAMICTAL) 100 MG tablet TAKE 1 TABLET BY MOUTH TWICE DAILY 60 tablet 5   Lancets 30G MISC Once daily testing dx e11.9 50  each 11   omeprazole (PRILOSEC) 40 MG capsule TAKE ONE CAPSULE BY MOUTH EVERY DAY 90 capsule 4   OZEMPIC, 0.25 OR 0.5 MG/DOSE, 2 MG/3ML SOPN inject 0.5mg  SUBCUTANEOUSLY WEEKLY 3 mL 3   rosuvastatin (CRESTOR) 5 MG tablet TAKE 1 TABLET BY MOUTH ON MONDAY, WEDNESDAY, AND FRIDAY AT BEDTIME. 36 tablet 3   traMADol (ULTRAM) 50 MG tablet Take 1 tablet (50 mg total) by mouth every 6 (six) hours as needed for up to 5 days. One tablet every six hours as needed for pain. 20 tablet 0   triamcinolone (KENALOG) 0.025 % ointment APPLY TO THE AFFECTED AREA(S) TWICE DAILY AS NEEDED ECZEMA/RASHES 30 g 1   predniSONE (STERAPRED UNI-PAK 21 TAB) 5 MG (21) TBPK tablet Take 6 pills first day; 5 pills second day; 4 pills third day; 3 pills fourth day; 2 pills next day and 1 pill last day. (Patient not taking: Reported on 12/21/2022) 21 tablet 0   No current facility-administered medications for this visit.    Allergies as of 12/21/2022 - Review Complete 12/21/2022  Allergen Reaction Noted   Simvastatin Other (See Comments) 06/16/2021    Family History  Problem Relation Age of Onset   Breast cancer Mother    Cancer Father        unsure of origin - spread to bones   Kidney disease Brother    Diabetes Sister    Breast cancer Sister    Colon cancer Neg Hx     Social History   Socioeconomic History   Marital status: Married    Spouse name: Jola Babinski   Number of children: 0   Years of education: 12   Highest education level: Not on file  Occupational History   Occupation: retired Biochemist, clinical  Tobacco Use   Smoking status: Former    Current packs/day: 0.00    Types: Cigarettes    Quit date: 03/16/2012    Years since quitting: 10.7   Smokeless tobacco: Never  Vaping Use   Vaping status: Never Used  Substance and Sexual Activity   Alcohol use: No    Alcohol/week: 0.0 standard drinks of alcohol    Comment: History of alcohol use with beer on weekends, none since 2014    Drug use: No   Sexual  activity: Yes  Other Topics Concern   Not on file  Social History Narrative   Patient lives with wife.   Patient has no biological children. He has two stepchildren.   Patient is left handed.   Patient has a high school education.   No daily use of caffeine.   Social Determinants of Health   Financial Resource Strain: Not on file  Food Insecurity: Not on file  Transportation Needs: Not on file  Physical Activity: Not  on file  Stress: Not on file  Social Connections: Not on file  Intimate Partner Violence: Not on file     Review of Systems   Gen: Denies any fever, chills, fatigue, weight loss, lack of appetite.  CV: Denies chest pain, heart palpitations, peripheral edema, syncope.  Resp: Denies shortness of breath at rest or with exertion. Denies wheezing or cough.  GI: Denies dysphagia or odynophagia. Denies jaundice, hematemesis, fecal incontinence. GU : Denies urinary burning, urinary frequency, urinary hesitancy MS: Denies joint pain, muscle weakness, cramps, or limitation of movement.  Derm: Denies rash, itching, dry skin Psych: Denies depression, anxiety, memory loss, and confusion Heme: Denies bruising, bleeding, and enlarged lymph nodes.   Physical Exam   BP 130/70 (BP Location: Left Arm, Patient Position: Sitting, Cuff Size: Large)   Pulse (!) 102   Temp 98.4 F (36.9 C) (Temporal)   Ht 5\' 10"  (1.778 m)   Wt 190 lb 6.4 oz (86.4 kg)   BMI 27.32 kg/m  General:   Alert and oriented. Pleasant and cooperative. Well-nourished and well-developed.  Head:  Normocephalic and atraumatic. Eyes:  Without icterus Ears:  Normal auditory acuity. Lungs:  Clear to auscultation bilaterally.  Heart:  S1, S2 present without murmurs appreciated.  Abdomen:  +BS, soft, non-tender and non-distended. No HSM noted. No guarding or rebound. No masses appreciated.  Rectal:  Deferred  Msk:  Symmetrical without gross deformities. Normal posture. Extremities:  Without edema. Neurologic:   Alert and  oriented x4;  grossly normal neurologically. Skin:  Intact without significant lesions or rashes. Psych:  Alert and cooperative. Normal mood and affect.   Assessment   NOELLE DOTO is a 62 y.o. male presenting today with at the request of Dr. Syliva Overman due to chronically elevated alk phos.  Upon review of chart, it appears isolated elevation of alk phos without bump in transaminases or bilirubin. Known hepatic steatosis on [place] yesterday with normal CBD. I do note history of ETOH but has been sober since 2014. As he reports mother with history of liver disease due to alcohol, we need to ensure no other underlying factors contributing to possible occult liver disease not solely related to ETOH.   Will start with alk phos isoenzymes, GGT, AMA. Hep C antibody negative in 2016. May need further evaluation to include full serologies to rule out any underlying occult liver disease.   Chronic GERD on omeprazole. No alarm signs/symptoms. Can pursue screening EGD at time of surveillance colonoscopy.   History of adenoma in 2018: surveillance colonoscopy in near future.     PLAN   Alk phos isoenzymes, GGT, AMA: after review will likely need further labs  Proceed with colonoscopy/EGD by Dr. Marletta Lor  in near future: the risks, benefits, and alternatives have been discussed with the patient in detail. The patient states understanding and desires to proceed.   Hold Ozempic one week prior  Follow-up thereafter   Gelene Mink, PhD, ANP-BC [facility]

## 2022-12-22 ENCOUNTER — Encounter: Payer: Self-pay | Admitting: Gastroenterology

## 2022-12-23 ENCOUNTER — Other Ambulatory Visit: Payer: Self-pay | Admitting: Family Medicine

## 2022-12-27 LAB — ALKALINE PHOSPHATASE, ISOENZYMES
Alkaline Phosphatase: 150 [IU]/L — ABNORMAL HIGH (ref 44–121)
BONE FRACTION: 29 % (ref 12–68)
INTESTINAL FRAC.: 3 % (ref 0–18)
LIVER FRACTION: 68 % (ref 13–88)

## 2022-12-27 LAB — GAMMA GT: GGT: 28 [IU]/L (ref 0–65)

## 2022-12-27 LAB — MITOCHONDRIAL ANTIBODIES: Mitochondrial Ab: <20 U (ref 0.0–20.0)

## 2023-01-04 ENCOUNTER — Other Ambulatory Visit (INDEPENDENT_AMBULATORY_CARE_PROVIDER_SITE_OTHER): Payer: Self-pay

## 2023-01-04 ENCOUNTER — Encounter: Payer: Self-pay | Admitting: Orthopaedic Surgery

## 2023-01-04 ENCOUNTER — Ambulatory Visit: Payer: BC Managed Care – PPO | Admitting: Orthopaedic Surgery

## 2023-01-04 VITALS — BP 143/76 | HR 94 | Ht 70.0 in | Wt 201.1 lb

## 2023-01-04 DIAGNOSIS — M545 Low back pain, unspecified: Secondary | ICD-10-CM | POA: Diagnosis not present

## 2023-01-04 MED ORDER — OXYCODONE-ACETAMINOPHEN 5-325 MG PO TABS: ORAL_TABLET | ORAL | 0 refills | Status: DC

## 2023-01-04 MED ORDER — NAPROXEN 500 MG PO TABS: 500.0000 mg | ORAL_TABLET | Freq: Two times a day (BID) | ORAL | 5 refills | Status: DC

## 2023-01-04 NOTE — Progress Notes (Addendum)
My back is worse.  His lower back pain is worse.  He lifted a large bag of dog food Saturday, November 30, and his pain got worse.  The prednisone was not tolerated and he stopped it.  He has used the Toradol but it has helped only slightly.  His pain is localized to the mid lower back.  Back is tender, no spasm lumbar area, NV intact, ROM limited secondary to pain, muscle tone and strength normal.  X-rays were done of the lumbar spine, reported separately.  Encounter Diagnosis  Name Primary?   Lumbar pain Yes   I will get a MRI of the lumbar spine.  Return in two weeks.  I will stop the Toradol and begin Percocet 5. I will give naprosyn.  I have reviewed the [place] Controlled Substance Reporting System web site prior to prescribing narcotic medicine for this patient.  Continue the Flexeril.  Call if any problem.  Precautions discussed.  Electronically Signed Darreld Mclean, MD 12/4/20248:44 AM

## 2023-01-04 NOTE — Patient Instructions (Signed)
Central Scheduling [phone]

## 2023-01-04 NOTE — Addendum Note (Signed)
Addended by: Earnstine Regal on: 01/04/2023 09:18 AM   Modules accepted: Orders

## 2023-01-09 ENCOUNTER — Other Ambulatory Visit: Payer: Self-pay | Admitting: Family Medicine

## 2023-01-10 ENCOUNTER — Ambulatory Visit (HOSPITAL_COMMUNITY): Payer: BC Managed Care – PPO

## 2023-01-11 ENCOUNTER — Telehealth: Payer: Self-pay | Admitting: Family Medicine

## 2023-01-11 ENCOUNTER — Telehealth: Payer: Self-pay | Admitting: Orthopaedic Surgery

## 2023-01-11 ENCOUNTER — Other Ambulatory Visit: Payer: Self-pay | Admitting: Family Medicine

## 2023-01-11 MED ORDER — TEMAZEPAM 30 MG PO CAPS: 30.0000 mg | ORAL_CAPSULE | Freq: Every day | ORAL | 5 refills | Status: DC

## 2023-01-11 NOTE — Telephone Encounter (Signed)
MRI denied He must have PT first, and complete it then return to office for documentation that it was ineffective Before they will cover the MRI Patient has been mailed copy Closing referral Sent message to Delorise Jackson so she can advise patient, I think Dr Kirtland Bouchard has already ordered the PT but patient has not gone yet.

## 2023-01-11 NOTE — Telephone Encounter (Signed)
Copied from CRM [id number]. Topic: Clinical - Medication Refill >> Jan 11, 2023 11:20 AM Tiffany H wrote: Most Recent Primary Care Visit:  Provider: Syliva Overman E  Department: [facility]  Visit Type: PHYSICAL  Date: 12/08/2022  Medication: Temazepam 30mg  1x  Has the patient contacted their pharmacy? Yes (Agent: If no, request that the patient contact the pharmacy for the refill. If patient does not wish to contact the pharmacy document the reason why and proceed with request.) Autofill directly from pharmacy.  (Agent: If yes, when and what did the pharmacy advise?)  Is this the correct pharmacy for this prescription? Yes If no, delete pharmacy and type the correct one.  This is the patient's preferred pharmacy:   [facility] - [place], [place] - [address] [place] [address] Phone: [phone] Fax: [phone]  Has the prescription been filled recently? No  Is the patient out of the medication? No  Has the patient been seen for an appointment in the last year OR does the patient have an upcoming appointment? Yes  Can we respond through MyChart? No  Agent: Please be advised that Rx refills may take up to 3 business days. We ask that you follow-up with your pharmacy.

## 2023-01-11 NOTE — Telephone Encounter (Signed)
 Dr. Sanjuan Dame pt - pt lvm requesting a refill on Oxycodone 5-325 to be sent to [facility].

## 2023-01-14 ENCOUNTER — Ambulatory Visit (HOSPITAL_COMMUNITY): Payer: BC Managed Care – PPO

## 2023-01-16 ENCOUNTER — Telehealth: Payer: Self-pay | Admitting: Orthopaedic Surgery

## 2023-01-16 NOTE — Telephone Encounter (Signed)
Dr. Sanjuan Dame pt - pt stated that he's supposed to have PT and when he called to schedule it they told him they didn't have a referral.  He wants to speak to [facility]. [phone]

## 2023-01-16 NOTE — Telephone Encounter (Signed)
Spoke to Bryan Rodgers and he was at therapy when we spoke.  They received the referral this morning and he will be starting his Physical Therapy today

## 2023-01-17 DIAGNOSIS — M5451 Vertebrogenic low back pain: Secondary | ICD-10-CM | POA: Diagnosis not present

## 2023-01-17 DIAGNOSIS — M545 Low back pain, unspecified: Secondary | ICD-10-CM | POA: Diagnosis not present

## 2023-01-18 ENCOUNTER — Telehealth: Payer: Self-pay | Admitting: Orthopaedic Surgery

## 2023-01-19 ENCOUNTER — Ambulatory Visit: Payer: BC Managed Care – PPO | Admitting: Orthopaedic Surgery

## 2023-01-19 DIAGNOSIS — M5451 Vertebrogenic low back pain: Secondary | ICD-10-CM | POA: Diagnosis not present

## 2023-01-19 DIAGNOSIS — M545 Low back pain, unspecified: Secondary | ICD-10-CM | POA: Diagnosis not present

## 2023-01-23 ENCOUNTER — Other Ambulatory Visit: Payer: Self-pay | Admitting: Orthopaedic Surgery

## 2023-01-23 DIAGNOSIS — M545 Low back pain, unspecified: Secondary | ICD-10-CM | POA: Diagnosis not present

## 2023-01-23 DIAGNOSIS — M5451 Vertebrogenic low back pain: Secondary | ICD-10-CM | POA: Diagnosis not present

## 2023-01-26 DIAGNOSIS — M545 Low back pain, unspecified: Secondary | ICD-10-CM | POA: Diagnosis not present

## 2023-01-26 DIAGNOSIS — M5451 Vertebrogenic low back pain: Secondary | ICD-10-CM | POA: Diagnosis not present

## 2023-01-30 DIAGNOSIS — M545 Low back pain, unspecified: Secondary | ICD-10-CM | POA: Diagnosis not present

## 2023-01-30 DIAGNOSIS — M5451 Vertebrogenic low back pain: Secondary | ICD-10-CM | POA: Diagnosis not present

## 2023-01-31 ENCOUNTER — Telehealth: Payer: Self-pay | Admitting: Orthopaedic Surgery

## 2023-01-31 ENCOUNTER — Encounter (HOSPITAL_COMMUNITY)
Admission: RE | Disposition: A | Discharge status: Home / Self Care | Payer: Self-pay | Source: Home / Self Care | Attending: Internal Medicine

## 2023-01-31 ENCOUNTER — Ambulatory Visit (HOSPITAL_COMMUNITY): Payer: Self-pay | Admitting: Anesthesiology

## 2023-01-31 ENCOUNTER — Encounter (HOSPITAL_COMMUNITY): Payer: Self-pay | Admitting: Internal Medicine

## 2023-01-31 ENCOUNTER — Other Ambulatory Visit: Payer: Self-pay

## 2023-01-31 ENCOUNTER — Other Ambulatory Visit: Payer: Self-pay | Admitting: Orthopaedic Surgery

## 2023-01-31 ENCOUNTER — Ambulatory Visit (HOSPITAL_COMMUNITY)
Admission: RE | Admit: 2023-01-31 | Discharge: 2023-01-31 | Disposition: A | Discharge status: Home / Self Care | Payer: BC Managed Care – PPO | Attending: Internal Medicine | Admitting: Internal Medicine

## 2023-01-31 DIAGNOSIS — K648 Other hemorrhoids: Secondary | ICD-10-CM | POA: Diagnosis not present

## 2023-01-31 DIAGNOSIS — K319 Disease of stomach and duodenum, unspecified: Secondary | ICD-10-CM | POA: Diagnosis not present

## 2023-01-31 DIAGNOSIS — D649 Anemia, unspecified: Secondary | ICD-10-CM | POA: Insufficient documentation

## 2023-01-31 DIAGNOSIS — I10 Essential (primary) hypertension: Secondary | ICD-10-CM | POA: Diagnosis not present

## 2023-01-31 DIAGNOSIS — R0602 Shortness of breath: Secondary | ICD-10-CM | POA: Diagnosis not present

## 2023-01-31 DIAGNOSIS — R519 Headache, unspecified: Secondary | ICD-10-CM | POA: Diagnosis not present

## 2023-01-31 DIAGNOSIS — Z13810 Encounter for screening for upper gastrointestinal disorder: Secondary | ICD-10-CM | POA: Diagnosis not present

## 2023-01-31 DIAGNOSIS — Z791 Long term (current) use of non-steroidal anti-inflammatories (NSAID): Secondary | ICD-10-CM | POA: Diagnosis not present

## 2023-01-31 DIAGNOSIS — K2970 Gastritis, unspecified, without bleeding: Secondary | ICD-10-CM | POA: Diagnosis not present

## 2023-01-31 DIAGNOSIS — Z87891 Personal history of nicotine dependence: Secondary | ICD-10-CM | POA: Insufficient documentation

## 2023-01-31 DIAGNOSIS — K635 Polyp of colon: Secondary | ICD-10-CM

## 2023-01-31 DIAGNOSIS — K219 Gastro-esophageal reflux disease without esophagitis: Secondary | ICD-10-CM | POA: Diagnosis not present

## 2023-01-31 DIAGNOSIS — D122 Benign neoplasm of ascending colon: Secondary | ICD-10-CM | POA: Diagnosis not present

## 2023-01-31 DIAGNOSIS — K5730 Diverticulosis of large intestine without perforation or abscess without bleeding: Secondary | ICD-10-CM | POA: Diagnosis not present

## 2023-01-31 DIAGNOSIS — D124 Benign neoplasm of descending colon: Secondary | ICD-10-CM | POA: Insufficient documentation

## 2023-01-31 DIAGNOSIS — Z79899 Other long term (current) drug therapy: Secondary | ICD-10-CM | POA: Insufficient documentation

## 2023-01-31 DIAGNOSIS — Z1211 Encounter for screening for malignant neoplasm of colon: Secondary | ICD-10-CM | POA: Insufficient documentation

## 2023-01-31 DIAGNOSIS — Z860100 Personal history of colon polyps, unspecified: Secondary | ICD-10-CM | POA: Diagnosis not present

## 2023-01-31 HISTORY — PX: ESOPHAGOGASTRODUODENOSCOPY (EGD) WITH PROPOFOL: SHX5813

## 2023-01-31 HISTORY — PX: BIOPSY: SHX5522

## 2023-01-31 HISTORY — PX: POLYPECTOMY: SHX149

## 2023-01-31 HISTORY — PX: COLONOSCOPY WITH PROPOFOL: SHX5780

## 2023-01-31 LAB — GLUCOSE, CAPILLARY: Glucose-Capillary: 96 mg/dL (ref 70–99)

## 2023-01-31 SURGERY — COLONOSCOPY WITH PROPOFOL
Anesthesia: General

## 2023-01-31 MED ORDER — PROPOFOL 10 MG/ML IV BOLUS
INTRAVENOUS | Status: DC | PRN
  Administered 2023-01-31: 50 mg via INTRAVENOUS
  Administered 2023-01-31: 100 mg via INTRAVENOUS
  Administered 2023-01-31 (×3): 50 mg via INTRAVENOUS

## 2023-01-31 MED ORDER — LACTATED RINGERS IV SOLN: INTRAVENOUS | Status: DC

## 2023-01-31 MED ORDER — LIDOCAINE HCL (PF) 2 % IJ SOLN
INTRAMUSCULAR | Status: DC | PRN
  Administered 2023-01-31: 100 mg via INTRADERMAL

## 2023-01-31 MED ORDER — PROPOFOL 500 MG/50ML IV EMUL
INTRAVENOUS | Status: DC | PRN
  Administered 2023-01-31: 150 ug/kg/min via INTRAVENOUS

## 2023-01-31 NOTE — H&P (Signed)
 Primary Care Physician:  Antonetta Rollene BRAVO, MD Primary Gastroenterologist:  Dr. Cindie  Pre-Procedure History & Physical: HPI:  Bryan Rodgers is a 62 y.o. male is here for an upper endoscopy for Barrett's screening, GERD and a colonoscopy to be performed for surveillance purposes, personal history of adenomatous colon polyps in 2018  Past Medical History:  Diagnosis Date   Alcoholic (HCC) quit 2014   10-15 beers per day on the weekends    Anemia    Arthritis    Asthma    childhood asthma up to age 66   Chest pain    Palpations   Dyspnea    Elevated CPK    GERD (gastroesophageal reflux disease)    Hallucination    Headache    Hemorrhoids    with bleeding    History of colonic polyps    removed via colonscope   Hypertension    Kidney stone    Migraine    Neuromuscular disorder (HCC)    Overweight (BMI 25.0-29.9)    Pneumonia    Prediabetes 10/2012   HBA1C is 6.4   Tobacco abuse     Past Surgical History:  Procedure Laterality Date   CATARACT EXTRACTION W/ INTRAOCULAR LENS IMPLANT Bilateral 2012   COLONOSCOPY  2007   Dr. Harvey: internal hemorrhoids, otherwise normal   COLONOSCOPY  2004   reported history of colon polyp   COLONOSCOPY WITH PROPOFOL  N/A 07/26/2016   Procedure: COLONOSCOPY WITH PROPOFOL ;  Surgeon: Harvey Margo CROME, MD;  Location: [facility];  Service: Endoscopy;  Laterality: N/A;  830    hemmorrhoidectomy  2002   LUMBAR LAMINECTOMY/DECOMPRESSION MICRODISCECTOMY N/A 08/27/2020   Procedure: EXTRAFORAMINAL DECOMPRESSION RIGHT LUMBAR FIVE-SACRAL ONE, RESECT SYNOVIAL CYST;  Surgeon: Lanis Pupa, MD;  Location: [facility];  Service: Neurosurgery;  Laterality: N/A;   POLYPECTOMY  07/26/2016   Procedure: POLYPECTOMY;  Surgeon: Harvey Margo CROME, MD;  Location: [facility];  Service: Endoscopy;;  splenic flexure polyps x4    Prior to Admission medications   Medication Sig Start Date End Date Taking? Authorizing Provider  acetaminophen  (TYLENOL  8 HOUR)  650 MG CR tablet Take one tablet by mouth  one to two times daily for arthritis pain 05/17/22  Yes Antonetta Rollene BRAVO, MD  albuterol  (VENTOLIN  HFA) 108 (90 Base) MCG/ACT inhaler Inhale 2 puffs into the lungs every 6 (six) hours as needed for wheezing or shortness of breath. 12/08/22  Yes Antonetta Rollene BRAVO, MD  amLODipine -olmesartan  (AZOR ) 5-20 MG tablet TAKE ONE TABLET BY MOUTH EVERY DAY 10/20/22  Yes Antonetta Rollene BRAVO, MD  aspirin  EC 81 MG tablet Take 1 tablet (81 mg total) by mouth at bedtime. 09/01/20  Yes Lanis Pupa, MD  cyclobenzaprine  (FLEXERIL ) 10 MG tablet Take one tablet by mouth once weekly ,as needed, for back spasm 12/08/22  Yes Antonetta Rollene BRAVO, MD  lamoTRIgine  (LAMICTAL ) 100 MG tablet TAKE 1 TABLET BY MOUTH TWICE DAILY 12/23/22  Yes Antonetta Rollene BRAVO, MD  omeprazole  (PRILOSEC) 40 MG capsule TAKE ONE CAPSULE BY MOUTH EVERY DAY 11/21/22  Yes Antonetta Rollene BRAVO, MD  oxyCODONE -acetaminophen  (PERCOCET/ROXICET) 5-325 MG tablet TAKE ONE TABLET BY MOUTH EVERY 4 HOURS AS NEEDED FOR PAIN 01/23/23  Yes Margrette Taft BRAVO, MD  rosuvastatin  (CRESTOR ) 5 MG tablet TAKE 1 TABLET BY MOUTH ON MONDAY, WEDNESDAY, AND FRIDAY AT BEDTIME. 12/23/22  Yes Antonetta Rollene BRAVO, MD  temazepam  (RESTORIL ) 30 MG capsule Take 1 capsule (30 mg total) by mouth at bedtime. 01/19/23  Yes Antonetta Rollene  E, MD  triamcinolone  (KENALOG ) 0.025 % ointment APPLY TO THE AFFECTED AREA(S) TWICE DAILY AS NEEDED ECZEMA/RASHES 07/04/22  Yes Antonetta Rollene BRAVO, MD  blood glucose meter kit and supplies Dispense based on patient and insurance preference. Once daily testing DX E11.9 11/18/20   Antonetta Rollene BRAVO, MD  glucose blood test strip Use as instructed once daily dx e11.9 11/18/20   Antonetta Rollene BRAVO, MD  Lancets 30G MISC Once daily testing dx e11.9 11/18/20   Antonetta Rollene BRAVO, MD  naproxen  (NAPROSYN ) 500 MG tablet Take 1 tablet (500 mg total) by mouth 2 (two) times daily with a meal. 01/04/23   Brenna Lin, MD   OZEMPIC , 0.25 OR 0.5 MG/DOSE, 2 MG/3ML SOPN inject 0.5mg  SUBCUTANEOUSLY WEEKLY 08/22/22   Antonetta Rollene BRAVO, MD    Allergies as of 12/21/2022 - Review Complete 12/21/2022  Allergen Reaction Noted   Simvastatin  Other (See Comments) 06/16/2021    Family History  Problem Relation Age of Onset   Breast cancer Mother    Cancer Father        unsure of origin - spread to bones   Kidney disease Brother    Diabetes Sister    Breast cancer Sister    Colon cancer Neg Hx     Social History   Socioeconomic History   Marital status: Married    Spouse name: Bryan Rodgers   Number of children: 0   Years of education: 12   Highest education level: Not on file  Occupational History   Occupation: retired biochemist, clinical  Tobacco Use   Smoking status: Former    Current packs/day: 0.00    Types: Cigarettes    Quit date: 03/16/2012    Years since quitting: 10.8   Smokeless tobacco: Never  Vaping Use   Vaping status: Never Used  Substance and Sexual Activity   Alcohol use: No    Alcohol/week: 0.0 standard drinks of alcohol    Comment: History of alcohol use with beer on weekends, none since 2014    Drug use: No   Sexual activity: Yes  Other Topics Concern   Not on file  Social History Narrative   Patient lives with wife.   Patient has no biological children. He has two stepchildren.   Patient is left handed.   Patient has a high school education.   No daily use of caffeine .   Social Drivers of Corporate Investment Banker Strain: Not on file  Food Insecurity: Not on file  Transportation Needs: Not on file  Physical Activity: Not on file  Stress: Not on file  Social Connections: Not on file  Intimate Partner Violence: Not on file    Review of Systems: See HPI, otherwise negative ROS  Physical Exam: Vital signs in last 24 hours: Temp:  [98 F (36.7 C)] 98 F (36.7 C) (12/31 0945) Pulse Rate:  [92] 92 (12/31 0945) Resp:  [20] 20 (12/31 0945) BP: (133)/(73) 133/73 (12/31  0945) SpO2:  [100 %] 100 % (12/31 0945) Weight:  [93.9 kg] 93.9 kg (12/31 0945)   General:   Alert,  Well-developed, well-nourished, pleasant and cooperative in NAD Head:  Normocephalic and atraumatic. Eyes:  Sclera clear, no icterus.   Conjunctiva pink. Ears:  Normal auditory acuity. Nose:  No deformity, discharge,  or lesions. Msk:  Symmetrical without gross deformities. Normal posture. Extremities:  Without clubbing or edema. Neurologic:  Alert and  oriented x4;  grossly normal neurologically. Skin:  Intact without significant lesions or rashes. Psych:  Alert and cooperative. Normal mood and affect.  Impression/Plan: Dairon E Chatwin is here for an upper endoscopy for Barrett's screening, GERD and a colonoscopy to be performed for surveillance purposes, personal history of adenomatous colon polyps in 2018  The risks of the procedure including infection, bleed, or perforation as well as benefits, limitations, alternatives and imponderables have been reviewed with the patient. Questions have been answered. All parties agreeable.

## 2023-01-31 NOTE — Anesthesia Preprocedure Evaluation (Signed)
 Anesthesia Evaluation  Patient identified by MRN, date of birth, ID band Patient awake    Reviewed: Allergy & Precautions, H&P , NPO status , Patient's Chart, lab work & pertinent test results, reviewed documented beta blocker date and time   Airway Mallampati: II  TM Distance: >3 FB Neck ROM: full    Dental no notable dental hx.    Pulmonary neg pulmonary ROS, shortness of breath, asthma , pneumonia, former smoker   Pulmonary exam normal breath sounds clear to auscultation       Cardiovascular Exercise Tolerance: Good hypertension, negative cardio ROS  Rhythm:regular Rate:Normal     Neuro/Psych  Headaches  Neuromuscular disease negative neurological ROS  negative psych ROS   GI/Hepatic negative GI ROS, Neg liver ROS,GERD  ,,  Endo/Other  negative endocrine ROSdiabetes    Renal/GU Renal diseasenegative Renal ROS  negative genitourinary   Musculoskeletal   Abdominal   Peds  Hematology negative hematology ROS (+) Blood dyscrasia, anemia   Anesthesia Other Findings   Reproductive/Obstetrics negative OB ROS                              Anesthesia Physical Anesthesia Plan  ASA: 2  Anesthesia Plan: General   Post-op Pain Management:    Induction:   PONV Risk Score and Plan: Propofol  infusion  Airway Management Planned:   Additional Equipment:   Intra-op Plan:   Post-operative Plan:   Informed Consent: I have reviewed the patients History and Physical, chart, labs and discussed the procedure including the risks, benefits and alternatives for the proposed anesthesia with the patient or authorized representative who has indicated his/her understanding and acceptance.     Dental Advisory Given  Plan Discussed with: CRNA  Anesthesia Plan Comments:          Anesthesia Quick Evaluation

## 2023-01-31 NOTE — Op Note (Signed)
 [facility] Patient Name: Bryan Rodgers Procedure Date: 01/31/2023 9:59 AM MRN: [id number] Date of Birth: 1960-07-09 Attending MD: Carlin POUR. Cindie , [place], [id number] CSN: [id number] Age: 62 Admit Type: Outpatient Procedure:                Upper GI endoscopy Indications:              Screening procedure, Screening for Barrett's                            esophagus in patient at risk for this condition Providers:                Carlin POUR. Cindie, DO, Crystal Page, Bascom Blush Referring MD:              Medicines:                See the Anesthesia note for documentation of the                            administered medications Complications:            No immediate complications. Estimated Blood Loss:     Estimated blood loss was minimal. Procedure:                Pre-Anesthesia Assessment:                           - The anesthesia plan was to use monitored                            anesthesia care (MAC).                           After obtaining informed consent, the endoscope was                            passed under direct vision. Throughout the                            procedure, the patient's blood pressure, pulse, and                            oxygen  saturations were monitored continuously. The                            GIF-H190 ([id number]) scope was introduced through the                            mouth, and advanced to the second part of duodenum.                            The upper GI endoscopy was accomplished without                            difficulty. The patient tolerated the procedure  well. Scope In: 10:11:40 AM Scope Out: 10:14:49 AM Total Procedure Duration: 0 hours 3 minutes 9 seconds  Findings:      The Z-line was regular.      Patchy mild inflammation characterized by erosions and nodularity was       found in the entire examined stomach. Biopsies were taken with a cold       forceps for Helicobacter pylori testing.       The duodenal bulb, first portion of the duodenum and second portion of       the duodenum were normal. Impression:               - Z-line regular.                           - Gastritis. Biopsied.                           - Normal duodenal bulb, first portion of the                            duodenum and second portion of the duodenum. Moderate Sedation:      Per Anesthesia Care Recommendation:           - Patient has a contact number available for                            emergencies. The signs and symptoms of potential                            delayed complications were discussed with the                            patient. Return to normal activities tomorrow.                            Written discharge instructions were provided to the                            patient.                           - Resume previous diet.                           - Continue present medications.                           - Await pathology results.                           - Return to GI clinic in 3 months.                           - Use a proton pump inhibitor PO daily. Procedure Code(s):        --- Professional ---  56760, Esophagogastroduodenoscopy, flexible,                            transoral; with biopsy, single or multiple Diagnosis Code(s):        --- Professional ---                           K29.70, Gastritis, unspecified, without bleeding                           Z13.810, Encounter for screening for upper                            gastrointestinal disorder CPT copyright 2022 American Medical Association. All rights reserved. The codes documented in this report are preliminary and upon coder review may  be revised to meet current compliance requirements. Carlin POUR. Cindie, DO Carlin POUR. [name], DO 01/31/2023 10:17:06 AM This report has been signed electronically. Number of Addenda: 0

## 2023-01-31 NOTE — Telephone Encounter (Signed)
There is already an open request for this in system. Has been sent to Dr Hilda Lias for review.

## 2023-01-31 NOTE — Anesthesia Postprocedure Evaluation (Signed)
 Anesthesia Post Note  Patient: Bryan Rodgers  Procedure(s) Performed: COLONOSCOPY WITH PROPOFOL  ESOPHAGOGASTRODUODENOSCOPY (EGD) WITH PROPOFOL  BIOPSY POLYPECTOMY INTESTINAL  Patient location during evaluation: Phase II Anesthesia Type: General Level of consciousness: awake Pain management: pain level controlled Vital Signs Assessment: post-procedure vital signs reviewed and stable Respiratory status: spontaneous breathing and respiratory function stable Cardiovascular status: blood pressure returned to baseline and stable Postop Assessment: no headache and no apparent nausea or vomiting Anesthetic complications: no Comments: Late entry   No notable events documented.   Last Vitals:  Vitals:   01/31/23 0945 01/31/23 1044  BP: 133/73 113/66  Pulse: 92 98  Resp: 20 (!) 24  Temp: 36.7 C 36.6 C  SpO2: 100% 98%    Last Pain:  Vitals:   01/31/23 1044  TempSrc: Oral  PainSc: 0-No pain                 [name]

## 2023-01-31 NOTE — Telephone Encounter (Signed)
Dr. Sanjuan Dame pt - pt lvm requesting a refill on Oxycodone 5-325, 25 tablets, every 4 hours PRN to be sent to [facility]

## 2023-01-31 NOTE — Transfer of Care (Signed)
 Immediate Anesthesia Transfer of Care Note  Patient: Bryan Rodgers  Procedure(s) Performed: COLONOSCOPY WITH PROPOFOL  ESOPHAGOGASTRODUODENOSCOPY (EGD) WITH PROPOFOL  BIOPSY POLYPECTOMY INTESTINAL  Patient Location: Endoscopy Unit  Anesthesia Type:General  Level of Consciousness: awake  Airway & Oxygen  Therapy: Patient Spontanous Breathing  Post-op Assessment: Report given to RN and Post -op Vital signs reviewed and stable  Post vital signs: Reviewed and stable  Last Vitals:  Vitals Value Taken Time  BP    Temp    Pulse    Resp    SpO2      Last Pain:  Vitals:   01/31/23 1007  TempSrc:   PainSc: 0-No pain      Patients Stated Pain Goal: 4 (01/31/23 0945)  Complications: No notable events documented.

## 2023-01-31 NOTE — Discharge Instructions (Addendum)
 EGD Discharge instructions Please read the instructions outlined below and refer to this sheet in the next few weeks. These discharge instructions provide you with general information on caring for yourself after you leave the hospital. Your doctor may also give you specific instructions. While your treatment has been planned according to the most current medical practices available, unavoidable complications occasionally occur. If you have any problems or questions after discharge, please call your doctor. ACTIVITY You may resume your regular activity but move at a slower pace for the next 24 hours.  Take frequent rest periods for the next 24 hours.  Walking will help expel (get rid of) the air and reduce the bloated feeling in your abdomen.  No driving for 24 hours (because of the anesthesia (medicine) used during the test).  You may shower.  Do not sign any important legal documents or operate any machinery for 24 hours (because of the anesthesia used during the test).  NUTRITION Drink plenty of fluids.  You may resume your normal diet.  Begin with a light meal and progress to your normal diet.  Avoid alcoholic beverages for 24 hours or as instructed by your caregiver.  MEDICATIONS You may resume your normal medications unless your caregiver tells you otherwise.  WHAT YOU CAN EXPECT TODAY You may experience abdominal discomfort such as a feeling of fullness or "gas" pains.  FOLLOW-UP Your doctor will discuss the results of your test with you.  SEEK IMMEDIATE MEDICAL ATTENTION IF ANY OF THE FOLLOWING OCCUR: Excessive nausea (feeling sick to your stomach) and/or vomiting.  Severe abdominal pain and distention (swelling).  Trouble swallowing.  Temperature over 101 F (37.8 C).  Rectal bleeding or vomiting of blood.    Colonoscopy Discharge Instructions  Read the instructions outlined below and refer to this sheet in the next few weeks. These discharge instructions provide you with  general information on caring for yourself after you leave the hospital. Your doctor may also give you specific instructions. While your treatment has been planned according to the most current medical practices available, unavoidable complications occasionally occur.   ACTIVITY You may resume your regular activity, but move at a slower pace for the next 24 hours.  Take frequent rest periods for the next 24 hours.  Walking will help get rid of the air and reduce the bloated feeling in your belly (abdomen).  No driving for 24 hours (because of the medicine (anesthesia) used during the test).   Do not sign any important legal documents or operate any machinery for 24 hours (because of the anesthesia used during the test).  NUTRITION Drink plenty of fluids.  You may resume your normal diet as instructed by your doctor.  Begin with a light meal and progress to your normal diet. Heavy or fried foods are harder to digest and may make you feel sick to your stomach (nauseated).  Avoid alcoholic beverages for 24 hours or as instructed.  MEDICATIONS You may resume your normal medications unless your doctor tells you otherwise.  WHAT YOU CAN EXPECT TODAY Some feelings of bloating in the abdomen.  Passage of more gas than usual.  Spotting of blood in your stool or on the toilet paper.  IF YOU HAD POLYPS REMOVED DURING THE COLONOSCOPY: No aspirin  products for 7 days or as instructed.  No alcohol for 7 days or as instructed.  Eat a soft diet for the next 24 hours.  FINDING OUT THE RESULTS OF YOUR TEST Not all test results are available  during your visit. If your test results are not back during the visit, make an appointment with your caregiver to find out the results. Do not assume everything is normal if you have not heard from your caregiver or the medical facility. It is important for you to follow up on all of your test results.  SEEK IMMEDIATE MEDICAL ATTENTION IF: You have more than a spotting of  blood in your stool.  Your belly is swollen (abdominal distention).  You are nauseated or vomiting.  You have a temperature over 101.  You have abdominal pain or discomfort that is severe or gets worse throughout the day.   Your EGD revealed mild amount inflammation in your stomach.  I took biopsies of this to rule out infection with a bacteria called H. pylori.  Esophagus and small bowel appeared normal.   Your colonoscopy revealed 4 polyp(s) which I removed successfully. Await pathology results, my office will contact you. I recommend repeating colonoscopy in 5 years for surveillance purposes.   You also have diverticulosis and internal hemorrhoids. I would recommend increasing fiber in your diet or adding OTC Benefiber/Metamucil. Be sure to drink at least 4 to 6 glasses of water  daily. Follow-up with GI in 3 months - office will call with appointment    I hope you have a great rest of your week!  Carlin POUR. Cindie, D.O. [facility]

## 2023-01-31 NOTE — Anesthesia Procedure Notes (Addendum)
 Date/Time: 01/31/2023 10:06 AM  Performed by: Eliodoro Deward FALCON, CRNAPre-anesthesia Checklist: Patient identified, Emergency Drugs available, Suction available and Patient being monitored Patient Re-evaluated:Patient Re-evaluated prior to induction Oxygen  Delivery Method: Nasal cannula Induction Type: IV induction Placement Confirmation: positive ETCO2

## 2023-01-31 NOTE — Op Note (Addendum)
 [facility] Patient Name: Bryan Rodgers Procedure Date: 01/31/2023 9:58 AM MRN: [id number] Date of Birth: [birth year] Attending MD: Carlin POUR. Cindie , [place], [id number] CSN: [id number] Age: 62 Admit Type: Outpatient Procedure:                Colonoscopy Indications:              Surveillance: Personal history of adenomatous                            polyps on last colonoscopy > 5 years ago Providers:                Carlin POUR. Cindie, DO, Crystal Page, Bascom Blush Referring MD:              Medicines:                See the Anesthesia note for documentation of the                            administered medications Complications:            No immediate complications. Estimated Blood Loss:     Estimated blood loss was minimal. Procedure:                Pre-Anesthesia Assessment:                           - The anesthesia plan was to use monitored                            anesthesia care (MAC).                           After obtaining informed consent, the colonoscope                            was passed under direct vision. Throughout the                            procedure, the patient's blood pressure, pulse, and                            oxygen  saturations were monitored continuously. The                            [id number]-[id number] ([id number]) scope was introduced through                            the anus and advanced to the the cecum, identified                            by appendiceal orifice and ileocecal valve. The                            colonoscopy was somewhat difficult due to a  redundant colon and significant looping. Successful                            completion of the procedure was aided by applying                            abdominal pressure. Scope In: 10:19:58 AM Scope Out: 10:42:08 AM Scope Withdrawal Time: 0 hours 14 minutes 41 seconds  Total Procedure Duration: 0 hours 22 minutes 10 seconds  Findings:      Non-bleeding  internal hemorrhoids were found during endoscopy.      Scattered large-mouthed and small-mouthed diverticula were found in the       entire colon.      A 4 mm polyp was found in the ascending colon. The polyp was sessile.       The polyp was removed with a cold snare. Resection and retrieval were       complete.      Three sessile polyps were found in the descending colon. The polyps were       4 to 6 mm in size. These polyps were removed with a cold snare.       Resection and retrieval were complete.      The exam was otherwise without abnormality. Impression:               - Non-bleeding internal hemorrhoids.                           - Diverticulosis in the entire examined colon.                           - One 4 mm polyp in the ascending colon, removed                            with a cold snare. Resected and retrieved.                           - Three 4 to 6 mm polyps in the descending colon,                            removed with a cold snare. Resected and retrieved.                           - The examination was otherwise normal. Moderate Sedation:      Per Anesthesia Care Recommendation:           - Patient has a contact number available for                            emergencies. The signs and symptoms of potential                            delayed complications were discussed with the                            patient. Return to normal activities tomorrow.  Written discharge instructions were provided to the                            patient.                           - Resume previous diet.                           - Continue present medications.                           - Await pathology results.                           - Repeat colonoscopy in 5 years for surveillance.                           - Return to GI clinic in 3 months. Procedure Code(s):        --- Professional ---                           [phone], Colonoscopy, flexible; with  removal of                            tumor(s), polyp(s), or other lesion(s) by snare                            technique Diagnosis Code(s):        --- Professional ---                           Z86.010, Personal history of colonic polyps                           K64.8, Other hemorrhoids                           D12.2, Benign neoplasm of ascending colon                           D12.4, Benign neoplasm of descending colon                           K57.30, Diverticulosis of large intestine without                            perforation or abscess without bleeding CPT copyright 2022 American Medical Association. All rights reserved. The codes documented in this report are preliminary and upon coder review may  be revised to meet current compliance requirements. Carlin POUR. Cindie, DO Carlin POUR. Cindie, DO 01/31/2023 10:46:17 AM This report has been signed electronically. Number of Addenda: 0

## 2023-02-03 LAB — SURGICAL PATHOLOGY

## 2023-02-07 DIAGNOSIS — M545 Low back pain, unspecified: Secondary | ICD-10-CM | POA: Diagnosis not present

## 2023-02-07 DIAGNOSIS — M5451 Vertebrogenic low back pain: Secondary | ICD-10-CM | POA: Diagnosis not present

## 2023-02-08 ENCOUNTER — Ambulatory Visit: Payer: BC Managed Care – PPO | Admitting: Orthopaedic Surgery

## 2023-02-08 ENCOUNTER — Telehealth: Payer: Self-pay | Admitting: Orthopaedic Surgery

## 2023-02-08 MED ORDER — OXYCODONE-ACETAMINOPHEN 5-325 MG PO TABS: 1.0000 | ORAL_TABLET | ORAL | 0 refills | Status: DC | PRN

## 2023-02-08 NOTE — Telephone Encounter (Signed)
 Returned the pt's call, explained w/Amy noted, I've called for his PT notes, they said they have been sending them, but they are going to send again.  Up to this point I didn't see them in his media tab.  Got his scheduled for tomorrow.

## 2023-02-08 NOTE — Telephone Encounter (Signed)
 We will need to see him in clinic before MRI can be ordered to document the improvement or lack of improvement with the PT  We need the PT notes  I will call him this afternoon

## 2023-02-08 NOTE — Telephone Encounter (Signed)
 DR.  Hilda Lias  Patient called request refill on his pain medicine  oxyCODONE-acetaminophen (PERCOCET/ROXICET) 5-325 MG tablet    Pharmacy:  [facility]

## 2023-02-08 NOTE — Telephone Encounter (Signed)
 KEELING   Patient did not gtet  MRI done, insurance would not approve and he had to do 6  days of PT and he has completed that.    Does he need to be sent for MRI now?   He has done PT like he was requested to do.  Please call him back at [phone]

## 2023-02-09 ENCOUNTER — Ambulatory Visit: Payer: BC Managed Care – PPO | Admitting: Orthopaedic Surgery

## 2023-02-10 ENCOUNTER — Encounter (HOSPITAL_COMMUNITY): Payer: Self-pay | Admitting: Internal Medicine

## 2023-02-14 ENCOUNTER — Telehealth: Payer: Self-pay | Admitting: Orthopaedic Surgery

## 2023-02-15 ENCOUNTER — Ambulatory Visit: Payer: BC Managed Care – PPO | Admitting: Orthopaedic Surgery

## 2023-02-21 ENCOUNTER — Telehealth: Payer: Self-pay | Admitting: Orthopaedic Surgery

## 2023-02-28 ENCOUNTER — Telehealth: Payer: Self-pay | Admitting: Orthopaedic Surgery

## 2023-03-08 ENCOUNTER — Telehealth: Payer: Self-pay | Admitting: Orthopaedic Surgery

## 2023-03-08 NOTE — Telephone Encounter (Signed)
 Dr. Sanjuan Dame pt - spoke w/the pt, he is requesting a refill on Oxycodone 5-325 to be sent to [facility].

## 2023-03-13 ENCOUNTER — Telehealth: Payer: Self-pay | Admitting: Orthopaedic Surgery

## 2023-03-14 ENCOUNTER — Other Ambulatory Visit: Payer: Self-pay | Admitting: Orthopaedic Surgery

## 2023-03-14 NOTE — Telephone Encounter (Signed)
Dr. Sanjuan Dame pt - spoke w/the pt, he's requesting a refill on Oxycodone 5-325 to be sent to [facility]

## 2023-03-21 ENCOUNTER — Telehealth: Payer: Self-pay | Admitting: Orthopaedic Surgery

## 2023-03-23 ENCOUNTER — Ambulatory Visit: Payer: BC Managed Care – PPO | Admitting: Gastroenterology

## 2023-03-28 ENCOUNTER — Telehealth: Payer: Self-pay | Admitting: Orthopaedic Surgery

## 2023-04-04 ENCOUNTER — Telehealth: Payer: Self-pay | Admitting: Orthopaedic Surgery

## 2023-04-05 ENCOUNTER — Telehealth: Payer: Self-pay | Admitting: Orthopaedic Surgery

## 2023-04-05 NOTE — Telephone Encounter (Signed)
 Dr. Sanjuan Dame pt - spoke w/the pt, he is requesting a refill on Oxycodone 5-325 to be sent to [facility].

## 2023-04-06 MED ORDER — OXYCODONE-ACETAMINOPHEN 5-325 MG PO TABS: 1.0000 | ORAL_TABLET | ORAL | 0 refills | Status: DC | PRN

## 2023-04-07 ENCOUNTER — Ambulatory Visit: Payer: BC Managed Care – PPO | Admitting: Family Medicine

## 2023-04-17 ENCOUNTER — Telehealth: Payer: Self-pay | Admitting: Orthopaedic Surgery

## 2023-04-24 ENCOUNTER — Telehealth: Payer: Self-pay | Admitting: Orthopaedic Surgery

## 2023-04-26 ENCOUNTER — Other Ambulatory Visit: Payer: Self-pay | Admitting: Family Medicine

## 2023-04-26 NOTE — Telephone Encounter (Signed)
 Left vm advising pt needs to be seen before refill

## 2023-04-28 ENCOUNTER — Other Ambulatory Visit: Payer: Self-pay | Admitting: Family Medicine

## 2023-05-02 ENCOUNTER — Other Ambulatory Visit: Payer: Self-pay | Admitting: Orthopaedic Surgery

## 2023-05-02 NOTE — Telephone Encounter (Signed)
 DR.  Hilda Lias   Fax Prescription refill for pain medicine    oxyCODONE-acetaminophen (PERCOCET/ROXICET) 5-325 MG tablet   Pharmacy: [facility]

## 2023-05-03 MED ORDER — OXYCODONE-ACETAMINOPHEN 5-325 MG PO TABS
1.0000 | ORAL_TABLET | ORAL | 0 refills | Status: DC | PRN
Start: 2023-05-03 — End: 2023-05-10

## 2023-05-08 ENCOUNTER — Telehealth: Payer: Self-pay | Admitting: Orthopedic Surgery

## 2023-05-08 NOTE — Telephone Encounter (Signed)
 I can't send a denial computer will not let me send in denied.  We have already left messages he needs appointment  To Dr Hilda Lias to send denial in again.

## 2023-05-10 ENCOUNTER — Telehealth: Payer: Self-pay | Admitting: Orthopedic Surgery

## 2023-05-10 ENCOUNTER — Telehealth: Payer: Self-pay | Admitting: Orthopaedic Surgery

## 2023-05-10 NOTE — Telephone Encounter (Signed)
 Dr. Sanjuan Dame pt- spoke w/the pt, he is wanting a refill on Oxycodone 5-325, but is stating that the pharmacy is telling him it was denied.

## 2023-05-10 NOTE — Telephone Encounter (Signed)
 Refill request received via fax for  Oxycodone its already been sent

## 2023-05-10 NOTE — Telephone Encounter (Signed)
 Yes, we left messages for him last week and this week he needs appointment for refills of meds

## 2023-05-16 ENCOUNTER — Ambulatory Visit: Payer: BC Managed Care – PPO | Admitting: Gastroenterology

## 2023-05-17 ENCOUNTER — Encounter: Payer: Self-pay | Admitting: Orthopaedic Surgery

## 2023-05-17 ENCOUNTER — Ambulatory Visit: Admitting: Orthopaedic Surgery

## 2023-05-17 DIAGNOSIS — M545 Low back pain, unspecified: Secondary | ICD-10-CM

## 2023-05-17 MED ORDER — OXYCODONE-ACETAMINOPHEN 5-325 MG PO TABS: 1.0000 | ORAL_TABLET | ORAL | 0 refills | Status: DC | PRN

## 2023-05-17 MED ORDER — CYCLOBENZAPRINE HCL 10 MG PO TABS: 10.0000 mg | ORAL_TABLET | Freq: Every day | ORAL | 0 refills | Status: DC

## 2023-05-17 NOTE — Addendum Note (Signed)
 Addended byArla Lab on: 05/17/2023 09:24 AM   Modules accepted: Orders

## 2023-05-17 NOTE — Patient Instructions (Signed)
 While we are working on your approval for MRI please go ahead and call to schedule your appointment with [facility] within at least one (1) week.   Central Scheduling [phone]

## 2023-05-17 NOTE — Progress Notes (Signed)
 My back is worse.  I saw him January 04, 2023 and recommended MRI.  It was denied as he needed PT.  He went to PT but has not improved.  He has more pain most of the time.  His pain is localized to the mid back with no paresthesias.  He has no weakness.  He has no new trauma.  Medicine is not helping.  I will get MRI.  Lower back is tender, ROM limited secondary to pain, NV intact, no weakness, muscle tone and strength are normal.  Encounter Diagnosis  Name Primary?   Lumbar pain Yes   To get MRI.  I will refill his pain medicine and Flexeril.  I have reviewed the [place]  Controlled Substance Reporting System web site prior to prescribing narcotic medicine for this patient.  Return in two weeks.  Call if any problem.  Precautions discussed.  Electronically Signed Pleasant Brilliant, MD 4/16/20258:36 AM

## 2023-05-23 ENCOUNTER — Telehealth: Payer: Self-pay | Admitting: Orthopaedic Surgery

## 2023-05-23 ENCOUNTER — Ambulatory Visit (HOSPITAL_COMMUNITY)
Admission: RE | Admit: 2023-05-23 | Discharge: 2023-05-23 | Disposition: A | Discharge status: Home / Self Care | Source: Ambulatory Visit | Attending: Orthopaedic Surgery | Admitting: Orthopaedic Surgery

## 2023-05-23 DIAGNOSIS — M4807 Spinal stenosis, lumbosacral region: Secondary | ICD-10-CM | POA: Diagnosis not present

## 2023-05-23 DIAGNOSIS — M545 Low back pain, unspecified: Secondary | ICD-10-CM | POA: Insufficient documentation

## 2023-05-23 DIAGNOSIS — M4319 Spondylolisthesis, multiple sites in spine: Secondary | ICD-10-CM | POA: Diagnosis not present

## 2023-05-23 DIAGNOSIS — M5135 Other intervertebral disc degeneration, thoracolumbar region: Secondary | ICD-10-CM | POA: Diagnosis not present

## 2023-05-23 DIAGNOSIS — M47816 Spondylosis without myelopathy or radiculopathy, lumbar region: Secondary | ICD-10-CM | POA: Diagnosis not present

## 2023-05-28 ENCOUNTER — Telehealth: Payer: Self-pay | Admitting: Orthopaedic Surgery

## 2023-05-28 ENCOUNTER — Other Ambulatory Visit: Payer: Self-pay | Admitting: Family Medicine

## 2023-05-30 ENCOUNTER — Telehealth: Payer: Self-pay | Admitting: Orthopaedic Surgery

## 2023-06-05 ENCOUNTER — Other Ambulatory Visit: Payer: Self-pay | Admitting: Orthopaedic Surgery

## 2023-06-07 ENCOUNTER — Ambulatory Visit: Admitting: Orthopaedic Surgery

## 2023-06-07 ENCOUNTER — Encounter: Payer: Self-pay | Admitting: Orthopaedic Surgery

## 2023-06-07 VITALS — BP 144/85 | HR 71 | Ht 70.0 in | Wt 207.0 lb

## 2023-06-07 DIAGNOSIS — M545 Low back pain, unspecified: Secondary | ICD-10-CM

## 2023-06-07 MED ORDER — OXYCODONE-ACETAMINOPHEN 5-325 MG PO TABS: 1.0000 | ORAL_TABLET | ORAL | 0 refills | Status: DC | PRN

## 2023-06-07 NOTE — Progress Notes (Signed)
 My back is hurting more  He has had MRI of the lumbar spine showing: IMPRESSION: Degenerative changes of the lumbar spine as above. No high-grade spinal canal stenosis. Multilevel foraminal stenosis, greatest and moderate on the right at L5-S1.   Previously noted synovial cyst involving the right neural foramen at L5-S1 is not appreciated on the current study.   Facet arthrosis throughout the lumbar spine, greatest and severe at L4-5 and L5-S1.   Grade 1 anterolisthesis of L4 on L5 and of L5 on S1, slightly increased from prior and likely related to chronic facet arthrosis.   Edema involving the left sacral ala which may be reactive in the setting of prominent left sacroiliac joint degenerative changes. Recommend correlation with history of trauma and consider CT of the sacrum/pelvis if concern for fracture.  I have explained the findings to him.  I will have him see Dr. Daisey Dryer for consideration of epidural injection.  Patient is agreeable.  Lower back is painful, more on the left lower side.  ROM is decreased secondary to pain, NV intact, Gait is good, muscle tone and strength normal.  Encounter Diagnosis  Name Primary?   Lumbar pain Yes   To Dr. Daisey Dryer.  I have reviewed the [place]  Controlled Substance Reporting System web site prior to prescribing narcotic medicine for this patient.  Return here in six weeks.  Call if any problem.  Precautions discussed.  Electronically Signed Pleasant Brilliant, MD 5/7/20258:20 AM

## 2023-06-07 NOTE — Patient Instructions (Signed)
We are referring you to [facility] from [facility] Office address is [address] [place] The phone number is [phone]  The office will call you with an appointment Dr. Newton will do the injection   

## 2023-06-13 ENCOUNTER — Telehealth: Payer: Self-pay | Admitting: Orthopaedic Surgery

## 2023-06-15 ENCOUNTER — Ambulatory Visit: Admitting: Gastroenterology

## 2023-06-19 ENCOUNTER — Telehealth: Payer: Self-pay | Admitting: Orthopaedic Surgery

## 2023-06-21 ENCOUNTER — Encounter: Payer: Self-pay | Admitting: Physical Medicine and Rehabilitation

## 2023-06-21 ENCOUNTER — Ambulatory Visit: Admitting: Physical Medicine and Rehabilitation

## 2023-06-21 DIAGNOSIS — M47819 Spondylosis without myelopathy or radiculopathy, site unspecified: Secondary | ICD-10-CM

## 2023-06-21 DIAGNOSIS — G8929 Other chronic pain: Secondary | ICD-10-CM

## 2023-06-21 DIAGNOSIS — M545 Low back pain, unspecified: Secondary | ICD-10-CM

## 2023-06-21 DIAGNOSIS — M47816 Spondylosis without myelopathy or radiculopathy, lumbar region: Secondary | ICD-10-CM

## 2023-06-21 NOTE — Progress Notes (Unsigned)
 Bryan Rodgers - 63 y.o. male MRN [id number]  Date of birth: 07-04-1960  Office Visit Note: Visit Date: 06/21/2023 PCP: Towanda Fret, MD Referred by: Towanda Fret, MD  Subjective: Chief Complaint  Patient presents with   Lower Back - Pain   HPI: Bryan Rodgers is a 63 y.o. male who comes in today per the request of Dr. Pleasant Brilliant for evaluation of chronic, worsening and severe left sided lower back pain. Pain ongoing for several years, worsens with standing and performing household tasks. Reports pain when standing to shave in bathroom and with washing dishes. States he has to take breaks and sit frequently. He describes pain as sore and pressure sensation, currently rates as 8 out of 10. Some relief of pain with home exercise regimen, rest and use of medications. He is prescribed Percocet by Dr. Iline Mallory, does take as needed. He recently completed course of formal physical therapy in [place], good relief of pain with these treatments. Recent lumbar MRI imaging shows multi level degenerative changes, grade 1 anterolisthesis of L4 on L5 and of L5 on S1, there is severe facet arthropathy at both of these levels. No high grade spinal canal stenosis. He underwent right L5-S1 decompression and resection of synovial disc with Dr. Nat Badger in 2022. At that time, he was having more right sided radicular symptoms, those have resolved post surgery. Patient denies focal weakness, numbness and tingling. No recent trauma or falls.       Review of Systems  Musculoskeletal:  Positive for back pain.  Neurological:  Negative for tingling, sensory change, focal weakness and weakness.  All other systems reviewed and are negative.  Otherwise per HPI.  Assessment & Plan: Visit Diagnoses:    ICD-10-CM   1. Chronic left-sided low back pain without sciatica  M54.50 Ambulatory referral to Physical Medicine Rehab   G89.29     2. Spondylosis without myelopathy or radiculopathy  M47.819  Ambulatory referral to Physical Medicine Rehab    3. Facet arthropathy, lumbar  M47.816 Ambulatory referral to Physical Medicine Rehab       Plan: Findings:  Chronic, worsening and severe left sided lower back pain. No radicular symptoms down the legs. Patient continues to have severe pain despite good conservative therapies such as formal physical therapy, home exercise regimen, rest and use of medications. I discussed recent lumbar MRI with patient today using imaging and spine model. Patients clinical presentation and exam are consistent with facet mediated pain. There is facet arthropathy at levels of L4-L5 and L5-S1. He has pain with lumbar extension on exam today. We discussed treatment plan in detail. Next step is to perform diagnostic left L4-L5 and L5-S1 facet joint/medial branch blocks. If good relief of pain with medial branch blocks we discussed longer sustained pain relief with radiofrequency ablation. Dr. Daisey Dryer at bedside to discuss injection procedure. He has no questions at this time. Could also look at re-grouping with physical therapy. No red flag symptoms noted upon exam today.     Meds & Orders: No orders of the defined types were placed in this encounter.   Orders Placed This Encounter  Procedures   Ambulatory referral to Physical Medicine Rehab    Follow-up: Return for Left L4-L5 and L5-S1 facet joint/medial branch blocks.   Procedures: No procedures performed      Clinical History: CLINICAL DATA:  Chronic lower back pain.   EXAM: MRI LUMBAR SPINE WITHOUT CONTRAST   TECHNIQUE: Multiplanar, multisequence MR imaging of the lumbar  spine was performed. No intravenous contrast was administered.   COMPARISON:  MRI lumbar spine 08/23/2020.   FINDINGS: Segmentation:  Standard.   Alignment: Lumbar lordosis is maintained. Grade 1 anterolisthesis of L4 on L5 and of L5 on S1 which appears slightly increased from the prior study.   Vertebrae: Degenerative endplate  changes and discogenic edema at T12-L1. No evidence of acute fracture. Vertebral body heights are maintained. Edema noted involving the left facets at L4-5 and L5-S1 likely related to facet arthrosis. There is additional bone marrow edema within the sacrum involving the left sacral ala. Degenerative changes of the sacroiliac joints, left greater than right, similar to prior.   Conus medullaris and cauda equina: Conus extends to the L1-2 level. Conus and cauda equina appear normal.   Paraspinal and other soft tissues: 5 mm cyst adjacent to the left facets at L4-5 projecting into the posterior paraspinal musculature compatible with synovial cyst. The paraspinal soft tissues are otherwise unremarkable. Cyst in the left kidney measuring less than 1 cm in diameter.   Disc levels:   T12-L1: Disc bulge which indents the ventral thecal sac without significant spinal canal stenosis. Mild facet arthrosis. Mild to moderate foraminal stenosis on the left.   L1-2: Minimal disc bulge. Mild facet arthrosis. No significant spinal canal or foraminal stenosis.   L2-3: Minimal disc bulge. Mild facet arthrosis. No significant spinal canal stenosis. No significant foraminal stenosis.   L3-4: No significant disc bulge or spinal canal stenosis. Mild facet arthrosis. There is mild bilateral foraminal stenosis.   L4-5: Grade 1 anterolisthesis with partial uncovering of the disc. Mild disc height loss. Minimal disc bulge. No significant spinal canal stenosis. Severe facet arthrosis bilaterally with synovial cyst noted on the left without spinal canal narrowing. There is no significant foraminal stenosis.   L5-S1: Grade 1 anterolisthesis with partial uncovering of the disc. Mild disc height loss. Diffuse disc bulge without significant spinal canal stenosis. Severe facet arthrosis. There is moderate right and mild left foraminal stenosis secondary to disc bulge and facet osteophytes. The previously  noted synovial cyst involving the right foramen is not visualized on the current study.   IMPRESSION: Degenerative changes of the lumbar spine as above. No high-grade spinal canal stenosis. Multilevel foraminal stenosis, greatest and moderate on the right at L5-S1.   Previously noted synovial cyst involving the right neural foramen at L5-S1 is not appreciated on the current study.   Facet arthrosis throughout the lumbar spine, greatest and severe at L4-5 and L5-S1.   Grade 1 anterolisthesis of L4 on L5 and of L5 on S1, slightly increased from prior and likely related to chronic facet arthrosis.   Edema involving the left sacral ala which may be reactive in the setting of prominent left sacroiliac joint degenerative changes. Recommend correlation with history of trauma and consider CT of the sacrum/pelvis if concern for fracture.     Electronically Signed   By: Denny Flack M.D.   On: 06/06/2023 09:19   He reports that he quit smoking about 11 years ago. His smoking use included cigarettes. He has never used smokeless tobacco.  Recent Labs    12/08/22 1500  HGBA1C 6.1*    Objective:  VS:  HT:    WT:   BMI:     BP:   HR: bpm  TEMP: ( )  RESP:  Physical Exam Vitals and nursing note reviewed.  HENT:     Head: Normocephalic and atraumatic.     Right Ear: External  ear normal.     Left Ear: External ear normal.     Nose: Nose normal.     Mouth/Throat:     Mouth: Mucous membranes are moist.  Eyes:     Extraocular Movements: Extraocular movements intact.  Cardiovascular:     Rate and Rhythm: Normal rate.     Pulses: Normal pulses.  Pulmonary:     Effort: Pulmonary effort is normal.  Abdominal:     General: Abdomen is flat. There is no distension.  Musculoskeletal:        General: Tenderness present.     Cervical back: Normal range of motion.     Comments: Patient rises from seated position to standing without difficulty. Pain noted with facet loading and lumbar  extension. 5/5 strength noted with bilateral hip flexion, knee flexion/extension, ankle dorsiflexion/plantarflexion and EHL. No clonus noted bilaterally. No pain upon palpation of greater trochanters. No pain with internal/external rotation of bilateral hips. Sensation intact bilaterally. Negative slump test bilaterally. Ambulates without aid, gait steady.     Skin:    General: Skin is warm and dry.     Capillary Refill: Capillary refill takes less than 2 seconds.  Neurological:     General: No focal deficit present.     Mental Status: He is alert and oriented to person, place, and time.  Psychiatric:        Mood and Affect: Mood normal.        Behavior: Behavior normal.     Ortho Exam  Imaging: No results found.  Past Medical/Family/Surgical/Social History: Medications & Allergies reviewed per EMR, new medications updated. Patient Active Problem List   Diagnosis Date Noted   Chronic low back pain 12/21/2022   History of colonic polyps 12/21/2022   Elevated alkaline phosphatase level 12/21/2022   Encounter for annual general medical examination with abnormal findings in adult 12/08/2022   Memory loss 12/03/2021   Synovial cyst of lumbar spine 06/16/2021   Diverticulosis 01/19/2021   Type 2 diabetes mellitus with vascular disease (HCC) 11/19/2020   Synovial cyst 08/27/2020   Lumbar radiculopathy 06/11/2020   Cerebral vascular disease 01/07/2020   Chronic intractable headache 01/07/2020   Tinea pedis 10/26/2019   Hallucinations 10/26/2019   Seasonal allergies 12/16/2018   Insomnia 07/26/2017   Hyperlipidemia associated with type 2 diabetes mellitus (HCC) 03/05/2017   Overweight (BMI 25.0-29.9) 08/22/2014   Migraine headache 07/23/2014   Encounter for annual physical exam 08/25/2013   Back pain with radiation 01/14/2013   Elevated PSA 08/11/2011   Vitamin D  deficiency 03/11/2011   GERD (gastroesophageal reflux disease) 03/10/2011   Arthritis 03/10/2011   HTN (hypertension)  07/08/2010   Internal hemorrhoids 02/09/2010   ADENOMATOUS COLONIC POLYP 09/07/2009   FATTY LIVER DISEASE 09/07/2009   Goiter 02/24/2008   Past Medical History:  Diagnosis Date   Alcoholic (HCC) quit 2014   10-15 beers per day on the weekends    Anemia    Arthritis    Asthma    childhood asthma up to age 63   Chest pain    Palpations   Dyspnea    Elevated CPK    GERD (gastroesophageal reflux disease)    Hallucination    Headache    Hemorrhoids    with bleeding    History of colonic polyps    removed via colonscope   Hypertension    Kidney stone    Migraine    Neuromuscular disorder (HCC)    Overweight (BMI 25.0-29.9)    Pneumonia  Prediabetes 10/2012   HBA1C is 6.4   Tobacco abuse    Family History  Problem Relation Age of Onset   Breast cancer Mother    Cancer Father        unsure of origin - spread to bones   Kidney disease Brother    Diabetes Sister    Breast cancer Sister    Colon cancer Neg Hx    Past Surgical History:  Procedure Laterality Date   BIOPSY  01/31/2023   Procedure: BIOPSY;  Surgeon: Vinetta Greening, DO;  Location: [facility];  Service: Endoscopy;;   CATARACT EXTRACTION W/ INTRAOCULAR LENS IMPLANT Bilateral 2012   COLONOSCOPY  2007   Dr. Nolene Baumgarten: internal hemorrhoids, otherwise normal   COLONOSCOPY  2004   reported history of colon polyp   COLONOSCOPY WITH PROPOFOL  N/A 07/26/2016   Procedure: COLONOSCOPY WITH PROPOFOL ;  Surgeon: Alyce Jubilee, MD;  Location: [facility];  Service: Endoscopy;  Laterality: N/A;  830    COLONOSCOPY WITH PROPOFOL  N/A 01/31/2023   Procedure: COLONOSCOPY WITH PROPOFOL ;  Surgeon: Vinetta Greening, DO;  Location: [facility];  Service: Endoscopy;  Laterality: N/A;  11:00am, asa 2   ESOPHAGOGASTRODUODENOSCOPY (EGD) WITH PROPOFOL  N/A 01/31/2023   Procedure: ESOPHAGOGASTRODUODENOSCOPY (EGD) WITH PROPOFOL ;  Surgeon: Vinetta Greening, DO;  Location: [facility];  Service: Endoscopy;  Laterality: N/A;    hemmorrhoidectomy  2002   LUMBAR LAMINECTOMY/DECOMPRESSION MICRODISCECTOMY N/A 08/27/2020   Procedure: EXTRAFORAMINAL DECOMPRESSION RIGHT LUMBAR FIVE-SACRAL ONE, RESECT SYNOVIAL CYST;  Surgeon: Augusto Blonder, MD;  Location: [facility];  Service: Neurosurgery;  Laterality: N/A;   POLYPECTOMY  07/26/2016   Procedure: POLYPECTOMY;  Surgeon: Alyce Jubilee, MD;  Location: [facility];  Service: Endoscopy;;  splenic flexure polyps x4   POLYPECTOMY  01/31/2023   Procedure: POLYPECTOMY INTESTINAL;  Surgeon: Vinetta Greening, DO;  Location: [facility];  Service: Endoscopy;;   Social History   Occupational History   Occupation: retired Biochemist, clinical  Tobacco Use   Smoking status: Former    Current packs/day: 0.00    Types: Cigarettes    Quit date: 03/16/2012    Years since quitting: 11.2   Smokeless tobacco: Never  Vaping Use   Vaping status: Never Used  Substance and Sexual Activity   Alcohol use: No    Alcohol/week: 0.0 standard drinks of alcohol    Comment: History of alcohol use with beer on weekends, none since 2014    Drug use: No   Sexual activity: Yes

## 2023-06-21 NOTE — Progress Notes (Unsigned)
 Core Outcome Measures Index (COMI) Back Score  Average Pain 8  COMI Score 50 %

## 2023-06-27 ENCOUNTER — Other Ambulatory Visit: Payer: Self-pay | Admitting: Family Medicine

## 2023-06-27 ENCOUNTER — Telehealth: Payer: Self-pay | Admitting: Orthopaedic Surgery

## 2023-07-04 ENCOUNTER — Telehealth: Payer: Self-pay | Admitting: Orthopaedic Surgery

## 2023-07-04 ENCOUNTER — Other Ambulatory Visit: Payer: Self-pay | Admitting: Orthopaedic Surgery

## 2023-07-04 NOTE — Telephone Encounter (Signed)
 Dr. Vicente Graham pt - spoke w/the pt, he is requesting a refill for Oxycodone  5-325 to be sent to [facility]

## 2023-07-05 ENCOUNTER — Ambulatory Visit: Admitting: Orthopaedic Surgery

## 2023-07-06 ENCOUNTER — Ambulatory Visit: Admitting: Gastroenterology

## 2023-07-10 ENCOUNTER — Other Ambulatory Visit: Payer: Self-pay

## 2023-07-10 ENCOUNTER — Other Ambulatory Visit: Payer: Self-pay | Admitting: Orthopaedic Surgery

## 2023-07-10 ENCOUNTER — Telehealth: Payer: Self-pay | Admitting: Orthopaedic Surgery

## 2023-07-10 ENCOUNTER — Ambulatory Visit: Admitting: Physical Medicine and Rehabilitation

## 2023-07-10 VITALS — BP 128/73 | HR 81

## 2023-07-10 DIAGNOSIS — M47816 Spondylosis without myelopathy or radiculopathy, lumbar region: Secondary | ICD-10-CM

## 2023-07-10 MED ORDER — BUPIVACAINE HCL 0.5 % IJ SOLN
3.0000 mL | Freq: Once | INTRAMUSCULAR | Status: AC
  Administered 2023-07-10: 3 mL

## 2023-07-10 NOTE — Telephone Encounter (Signed)
 Dr. Vicente Graham pt - pt lvm requesting a refill for Oxycodone  5-325 to be sent to [facility]

## 2023-07-10 NOTE — Patient Instructions (Signed)

## 2023-07-10 NOTE — Progress Notes (Signed)
 Bryan Rodgers - 63 y.o. male MRN [id number]  Date of birth: 09/08/1960  Office Visit Note: Visit Date: 07/10/2023 PCP: Towanda Fret, MD Referred by: Towanda Fret, MD  Subjective: Chief Complaint  Patient presents with   Lower Back - Pain   HPI:  Bryan Rodgers is a 63 y.o. male who comes in today at the request of Elvan Hamel, FNP and Dr. Pleasant Brilliant for planned Left  L4-5 and L5-S1 Lumbar facet/medial branch block with fluoroscopic guidance.  The patient has failed conservative care including home exercise, medications, time and activity modification.  This injection will be diagnostic and hopefully therapeutic.  Please see requesting physician notes for further details and justification.  Exam has shown concordant pain with facet joint loading.   ROS Otherwise per HPI.  Assessment & Plan: Visit Diagnoses:    ICD-10-CM   1. Spondylosis without myelopathy or radiculopathy, lumbar region  M47.816 XR C-ARM NO REPORT    Nerve Block    bupivacaine  (MARCAINE ) 0.5 % (with pres) injection 3 mL      Plan: No additional findings.   Meds & Orders:  Meds ordered this encounter  Medications   bupivacaine  (MARCAINE ) 0.5 % (with pres) injection 3 mL    Orders Placed This Encounter  Procedures   Nerve Block   XR C-ARM NO REPORT    Follow-up: Return for Review Pain Diary.   Procedures: No procedures performed  Lumbar Diagnostic Facet Joint Nerve Block with Fluoroscopic Guidance   Patient: Bryan Rodgers      Date of Birth: [birth year] MRN: [id number] PCP: Towanda Fret, MD      Visit Date: 07/10/2023   Universal Protocol:    Date/Time: 07/09/2509:23 AM  Consent Given By: the patient  Position: PRONE  Additional Comments: Vital signs were monitored before and after the procedure. Patient was prepped and draped in the usual sterile fashion. The correct patient, procedure, and site was verified.   Injection Procedure Details:   Procedure  diagnoses:  1. Spondylosis without myelopathy or radiculopathy, lumbar region      Meds Administered:  Meds ordered this encounter  Medications   bupivacaine  (MARCAINE ) 0.5 % (with pres) injection 3 mL     Laterality: Left  Location/Site: L4-L5, L3 and L4 medial branches and L5-S1, L4 medial branch and L5 dorsal ramus  Needle: 5.0 in., 25 ga.  Short bevel or Quincke spinal needle  Needle Placement: Oblique pedical  Findings:   -Comments: There was excellent flow of contrast along the articular pillars without intravascular flow.  Procedure Details: The fluoroscope beam is vertically oriented in AP and then obliqued 15 to 20 degrees to the ipsilateral side of the desired nerve to achieve the "Scotty dog" appearance.  The skin over the target area of the junction of the superior articulating process and the transverse process (sacral ala if blocking the L5 dorsal rami) was locally anesthetized with a 1 ml volume of 1% Lidocaine  without Epinephrine .  The spinal needle was inserted and advanced in a trajectory view down to the target.   After contact with periosteum and negative aspirate for blood and CSF, correct placement without intravascular or epidural spread was confirmed by injecting 0.5 ml. of Isovue-250.  A spot radiograph was obtained of this image.    Next, a 0.5 ml. volume of the injectate described above was injected. The needle was then redirected to the other facet joint nerves mentioned above if needed.  Prior to the procedure,  the patient was given a Pain Diary which was completed for baseline measurements.  After the procedure, the patient rated their pain every 30 minutes and will continue rating at this frequency for a total of 5 hours.  The patient has been asked to complete the Diary and return to us  by mail, fax or hand delivered as soon as possible.   Additional Comments:  The patient tolerated the procedure well Dressing: 2 x 2 sterile gauze and Band-Aid     Post-procedure details: Patient was observed during the procedure. Post-procedure instructions were reviewed.  Patient left the clinic in stable condition.   Clinical History: CLINICAL DATA:  Chronic lower back pain.   EXAM: MRI LUMBAR SPINE WITHOUT CONTRAST   TECHNIQUE: Multiplanar, multisequence MR imaging of the lumbar spine was performed. No intravenous contrast was administered.   COMPARISON:  MRI lumbar spine 08/23/2020.   FINDINGS: Segmentation:  Standard.   Alignment: Lumbar lordosis is maintained. Grade 1 anterolisthesis of L4 on L5 and of L5 on S1 which appears slightly increased from the prior study.   Vertebrae: Degenerative endplate changes and discogenic edema at T12-L1. No evidence of acute fracture. Vertebral body heights are maintained. Edema noted involving the left facets at L4-5 and L5-S1 likely related to facet arthrosis. There is additional bone marrow edema within the sacrum involving the left sacral ala. Degenerative changes of the sacroiliac joints, left greater than right, similar to prior.   Conus medullaris and cauda equina: Conus extends to the L1-2 level. Conus and cauda equina appear normal.   Paraspinal and other soft tissues: 5 mm cyst adjacent to the left facets at L4-5 projecting into the posterior paraspinal musculature compatible with synovial cyst. The paraspinal soft tissues are otherwise unremarkable. Cyst in the left kidney measuring less than 1 cm in diameter.   Disc levels:   T12-L1: Disc bulge which indents the ventral thecal sac without significant spinal canal stenosis. Mild facet arthrosis. Mild to moderate foraminal stenosis on the left.   L1-2: Minimal disc bulge. Mild facet arthrosis. No significant spinal canal or foraminal stenosis.   L2-3: Minimal disc bulge. Mild facet arthrosis. No significant spinal canal stenosis. No significant foraminal stenosis.   L3-4: No significant disc bulge or spinal canal  stenosis. Mild facet arthrosis. There is mild bilateral foraminal stenosis.   L4-5: Grade 1 anterolisthesis with partial uncovering of the disc. Mild disc height loss. Minimal disc bulge. No significant spinal canal stenosis. Severe facet arthrosis bilaterally with synovial cyst noted on the left without spinal canal narrowing. There is no significant foraminal stenosis.   L5-S1: Grade 1 anterolisthesis with partial uncovering of the disc. Mild disc height loss. Diffuse disc bulge without significant spinal canal stenosis. Severe facet arthrosis. There is moderate right and mild left foraminal stenosis secondary to disc bulge and facet osteophytes. The previously noted synovial cyst involving the right foramen is not visualized on the current study.   IMPRESSION: Degenerative changes of the lumbar spine as above. No high-grade spinal canal stenosis. Multilevel foraminal stenosis, greatest and moderate on the right at L5-S1.   Previously noted synovial cyst involving the right neural foramen at L5-S1 is not appreciated on the current study.   Facet arthrosis throughout the lumbar spine, greatest and severe at L4-5 and L5-S1.   Grade 1 anterolisthesis of L4 on L5 and of L5 on S1, slightly increased from prior and likely related to chronic facet arthrosis.   Edema involving the left sacral ala which may be reactive  in the setting of prominent left sacroiliac joint degenerative changes. Recommend correlation with history of trauma and consider CT of the sacrum/pelvis if concern for fracture.     Electronically Signed   By: Denny Flack M.D.   On: 06/06/2023 09:19     Objective:  VS:  HT:    WT:   BMI:     BP:128/73  HR:81bpm  TEMP: ( )  RESP:  Physical Exam Vitals and nursing note reviewed.  Constitutional:      General: He is not in acute distress.    Appearance: Normal appearance. He is not ill-appearing.  HENT:     Head: Normocephalic and atraumatic.     Right  Ear: External ear normal.     Left Ear: External ear normal.     Nose: No congestion.  Eyes:     Extraocular Movements: Extraocular movements intact.  Cardiovascular:     Rate and Rhythm: Normal rate.     Pulses: Normal pulses.  Pulmonary:     Effort: Pulmonary effort is normal. No respiratory distress.  Abdominal:     General: There is no distension.     Palpations: Abdomen is soft.  Musculoskeletal:        General: No tenderness or signs of injury.     Cervical back: Neck supple.     Right lower leg: No edema.     Left lower leg: No edema.     Comments: Patient has good distal strength without clonus.  Skin:    Findings: No erythema or rash.  Neurological:     General: No focal deficit present.     Mental Status: He is alert and oriented to person, place, and time.     Sensory: No sensory deficit.     Motor: No weakness or abnormal muscle tone.     Coordination: Coordination normal.  Psychiatric:        Mood and Affect: Mood normal.        Behavior: Behavior normal.      Imaging: XR C-ARM NO REPORT Result Date: 07/10/2023 Please see Notes tab for imaging impression.

## 2023-07-10 NOTE — Progress Notes (Signed)
 Patient is here for Left L4-L5 and L5-S1 facet joint/medial branch blocks. Pain comes and goes. Takes Oxycodone  for pain.    Pain Scale   Average Pain 8   +Driver, -BT, -Dye Allergies.

## 2023-07-10 NOTE — Procedures (Signed)
 Lumbar Diagnostic Facet Joint Nerve Block with Fluoroscopic Guidance   Patient: Bryan Rodgers      Date of Birth: [birth year] MRN: [id number] PCP: Towanda Fret, MD      Visit Date: 07/10/2023   Universal Protocol:    Date/Time: 07/09/2509:23 AM  Consent Given By: the patient  Position: PRONE  Additional Comments: Vital signs were monitored before and after the procedure. Patient was prepped and draped in the usual sterile fashion. The correct patient, procedure, and site was verified.   Injection Procedure Details:   Procedure diagnoses:  1. Spondylosis without myelopathy or radiculopathy, lumbar region      Meds Administered:  Meds ordered this encounter  Medications   bupivacaine  (MARCAINE ) 0.5 % (with pres) injection 3 mL     Laterality: Left  Location/Site: L4-L5, L3 and L4 medial branches and L5-S1, L4 medial branch and L5 dorsal ramus  Needle: 5.0 in., 25 ga.  Short bevel or Quincke spinal needle  Needle Placement: Oblique pedical  Findings:   -Comments: There was excellent flow of contrast along the articular pillars without intravascular flow.  Procedure Details: The fluoroscope beam is vertically oriented in AP and then obliqued 15 to 20 degrees to the ipsilateral side of the desired nerve to achieve the "Scotty dog" appearance.  The skin over the target area of the junction of the superior articulating process and the transverse process (sacral ala if blocking the L5 dorsal rami) was locally anesthetized with a 1 ml volume of 1% Lidocaine  without Epinephrine .  The spinal needle was inserted and advanced in a trajectory view down to the target.   After contact with periosteum and negative aspirate for blood and CSF, correct placement without intravascular or epidural spread was confirmed by injecting 0.5 ml. of Isovue-250.  A spot radiograph was obtained of this image.    Next, a 0.5 ml. volume of the injectate described above was injected. The needle  was then redirected to the other facet joint nerves mentioned above if needed.  Prior to the procedure, the patient was given a Pain Diary which was completed for baseline measurements.  After the procedure, the patient rated their pain every 30 minutes and will continue rating at this frequency for a total of 5 hours.  The patient has been asked to complete the Diary and return to us  by mail, fax or hand delivered as soon as possible.   Additional Comments:  The patient tolerated the procedure well Dressing: 2 x 2 sterile gauze and Band-Aid    Post-procedure details: Patient was observed during the procedure. Post-procedure instructions were reviewed.  Patient left the clinic in stable condition.

## 2023-07-11 ENCOUNTER — Telehealth: Payer: Self-pay

## 2023-07-11 NOTE — Telephone Encounter (Signed)
 Received fax from [facility]. Pt needing refill for Restoril  30mg . Please refill if appropriate

## 2023-07-12 ENCOUNTER — Ambulatory Visit: Admitting: Orthopaedic Surgery

## 2023-07-12 MED ORDER — OXYCODONE-ACETAMINOPHEN 5-325 MG PO TABS: 1.0000 | ORAL_TABLET | Freq: Four times a day (QID) | ORAL | 0 refills | Status: DC | PRN

## 2023-07-13 MED ORDER — TEMAZEPAM 30 MG PO CAPS: 30.0000 mg | ORAL_CAPSULE | Freq: Every evening | ORAL | 1 refills | Status: DC | PRN

## 2023-07-14 ENCOUNTER — Other Ambulatory Visit: Payer: Self-pay

## 2023-07-14 DIAGNOSIS — I10 Essential (primary) hypertension: Secondary | ICD-10-CM

## 2023-07-14 DIAGNOSIS — E1169 Type 2 diabetes mellitus with other specified complication: Secondary | ICD-10-CM

## 2023-07-14 DIAGNOSIS — E1159 Type 2 diabetes mellitus with other circulatory complications: Secondary | ICD-10-CM

## 2023-07-14 DIAGNOSIS — E559 Vitamin D deficiency, unspecified: Secondary | ICD-10-CM

## 2023-07-19 ENCOUNTER — Encounter: Payer: Self-pay | Admitting: Orthopaedic Surgery

## 2023-07-19 ENCOUNTER — Ambulatory Visit: Admitting: Orthopaedic Surgery

## 2023-07-19 ENCOUNTER — Other Ambulatory Visit: Payer: Self-pay | Admitting: Physical Medicine and Rehabilitation

## 2023-07-19 VITALS — BP 124/73 | HR 84 | Ht 70.0 in | Wt 207.0 lb

## 2023-07-19 DIAGNOSIS — G8929 Other chronic pain: Secondary | ICD-10-CM

## 2023-07-19 DIAGNOSIS — M47817 Spondylosis without myelopathy or radiculopathy, lumbosacral region: Secondary | ICD-10-CM

## 2023-07-19 DIAGNOSIS — M545 Low back pain, unspecified: Secondary | ICD-10-CM

## 2023-07-19 DIAGNOSIS — I10 Essential (primary) hypertension: Secondary | ICD-10-CM | POA: Diagnosis not present

## 2023-07-19 DIAGNOSIS — E1169 Type 2 diabetes mellitus with other specified complication: Secondary | ICD-10-CM | POA: Diagnosis not present

## 2023-07-19 DIAGNOSIS — E1159 Type 2 diabetes mellitus with other circulatory complications: Secondary | ICD-10-CM | POA: Diagnosis not present

## 2023-07-19 DIAGNOSIS — M47816 Spondylosis without myelopathy or radiculopathy, lumbar region: Secondary | ICD-10-CM

## 2023-07-19 DIAGNOSIS — E559 Vitamin D deficiency, unspecified: Secondary | ICD-10-CM | POA: Diagnosis not present

## 2023-07-19 DIAGNOSIS — E785 Hyperlipidemia, unspecified: Secondary | ICD-10-CM | POA: Diagnosis not present

## 2023-07-19 MED ORDER — OXYCODONE-ACETAMINOPHEN 5-325 MG PO TABS: 1.0000 | ORAL_TABLET | Freq: Four times a day (QID) | ORAL | 0 refills | Status: DC | PRN

## 2023-07-19 NOTE — Progress Notes (Signed)
 80% relief of pain with left L4-L5 and L5-S1 facet joint/medial branch blocks. Will place order for second set of diagnostic blocks.

## 2023-07-19 NOTE — Progress Notes (Signed)
 My back is hurting again.  He had the epidural with Dr. Daisey Dryer and had one full day of full relief and then the pain resumed in his lower back.  He has no new trauma.  He is not sleeping well.  He has been doing the exercises he learned in PT.  I have reviewed Dr. Ona Bidding notes.  ROM of back is good, no spasm, diffuse tenderness, NV intact, muscle tone and strength normal.  Encounter Diagnosis  Name Primary?   Chronic left-sided low back pain without sciatica Yes   I have reviewed the [place]  Controlled Substance Reporting System web site prior to prescribing narcotic medicine for this patient.  I will have him contact Dr. Daisey Dryer in about a month for consideration for new injection.  Return in six weeks.  Continue exercises.  Call if any problem.  Precautions discussed.  Electronically Signed Pleasant Brilliant, MD 6/18/20258:19 AM

## 2023-07-20 LAB — CMP14+EGFR
ALT: 26 IU/L (ref 0–44)
AST: 26 IU/L (ref 0–40)
Albumin: 4.6 g/dL (ref 3.9–4.9)
Alkaline Phosphatase: 146 IU/L — ABNORMAL HIGH (ref 44–121)
BUN/Creatinine Ratio: 17 (ref 10–24)
BUN: 17 mg/dL (ref 8–27)
Bilirubin Total: 0.5 mg/dL (ref 0.0–1.2)
CO2: 21 mmol/L (ref 20–29)
Calcium: 9.9 mg/dL (ref 8.6–10.2)
Chloride: 104 mmol/L (ref 96–106)
Creatinine, Ser: 1.03 mg/dL (ref 0.76–1.27)
Globulin, Total: 2.7 g/dL (ref 1.5–4.5)
Glucose: 96 mg/dL (ref 70–99)
Potassium: 4.1 mmol/L (ref 3.5–5.2)
Sodium: 140 mmol/L (ref 134–144)
Total Protein: 7.3 g/dL (ref 6.0–8.5)
eGFR: 81 mL/min/{1.73_m2} (ref >59)

## 2023-07-20 LAB — LIPID PANEL
Chol/HDL Ratio: 2.2 ratio (ref 0.0–5.0)
Cholesterol, Total: 94 mg/dL — ABNORMAL LOW (ref 100–199)
HDL: 43 mg/dL (ref >39)
LDL Chol Calc (NIH): 36 mg/dL (ref 0–99)
Triglycerides: 67 mg/dL (ref 0–149)
VLDL Cholesterol Cal: 15 mg/dL (ref 5–40)

## 2023-07-20 LAB — HEMOGLOBIN A1C
Est. average glucose Bld gHb Est-mCnc: 128 mg/dL
Hgb A1c MFr Bld: 6.1 % — ABNORMAL HIGH (ref 4.8–5.6)

## 2023-07-20 LAB — VITAMIN D 25 HYDROXY (VIT D DEFICIENCY, FRACTURES): Vit D, 25-Hydroxy: 29.2 ng/mL — ABNORMAL LOW (ref 30.0–100.0)

## 2023-07-28 ENCOUNTER — Telehealth: Payer: Self-pay | Admitting: Orthopaedic Surgery

## 2023-08-07 ENCOUNTER — Telehealth: Payer: Self-pay | Admitting: Orthopaedic Surgery

## 2023-08-11 ENCOUNTER — Ambulatory Visit: Admitting: Family Medicine

## 2023-08-11 ENCOUNTER — Encounter: Payer: Self-pay | Admitting: Family Medicine

## 2023-08-11 VITALS — BP 118/78 | HR 68 | Resp 16 | Ht 70.0 in | Wt 182.1 lb

## 2023-08-11 DIAGNOSIS — I10 Essential (primary) hypertension: Secondary | ICD-10-CM | POA: Diagnosis not present

## 2023-08-11 DIAGNOSIS — K219 Gastro-esophageal reflux disease without esophagitis: Secondary | ICD-10-CM

## 2023-08-11 DIAGNOSIS — F5104 Psychophysiologic insomnia: Secondary | ICD-10-CM

## 2023-08-11 DIAGNOSIS — R413 Other amnesia: Secondary | ICD-10-CM

## 2023-08-11 DIAGNOSIS — R2681 Unsteadiness on feet: Secondary | ICD-10-CM | POA: Diagnosis not present

## 2023-08-11 DIAGNOSIS — E1159 Type 2 diabetes mellitus with other circulatory complications: Secondary | ICD-10-CM

## 2023-08-11 DIAGNOSIS — R748 Abnormal levels of other serum enzymes: Secondary | ICD-10-CM

## 2023-08-11 DIAGNOSIS — R42 Dizziness and giddiness: Secondary | ICD-10-CM

## 2023-08-11 MED ORDER — ROSUVASTATIN CALCIUM 5 MG PO TABS: ORAL_TABLET | ORAL | 3 refills | Status: DC

## 2023-08-11 MED ORDER — TEMAZEPAM 30 MG PO CAPS: 30.0000 mg | ORAL_CAPSULE | Freq: Every evening | ORAL | 5 refills | Status: DC | PRN

## 2023-08-11 MED ORDER — OLMESARTAN MEDOXOMIL 20 MG PO TABS: 20.0000 mg | ORAL_TABLET | Freq: Every day | ORAL | 5 refills | Status: DC

## 2023-08-11 MED ORDER — ALBUTEROL SULFATE HFA 108 (90 BASE) MCG/ACT IN AERS: 2.0000 | INHALATION_SPRAY | Freq: Four times a day (QID) | RESPIRATORY_TRACT | 2 refills | Status: AC | PRN

## 2023-08-11 NOTE — Progress Notes (Unsigned)
 Bryan Rodgers     MRN: [id number]      DOB: 11-17-1960  Chief Complaint  Patient presents with   Medical Management of Chronic Issues    Follow up    Memory Loss    Pt states that his memory loss and balance has been getting worse. Pt believes this is due to his cerebral vascular disease    HPI Bryan Rodgers is here for follow up and re-evaluation of chronic medical conditions, medication management and review of any available recent lab and radiology data.  Preventive health is updated, specifically  Cancer screening and Immunization.   Questions or concerns regarding consultations or procedures which the PT has had in the interim are  addressed. The reports 20 min episode early June when he could not recall if his sister was dead or alive, could not remember her name and where she was living Going to the store, not recalling what he needs and questioning why he is even going out, started in April, now on avg 3 times per week this happens Short term memory loss increased  imbalance, fell due to imblance end December  ROS Denies recent fever or chills. Denies sinus pressure, nasal congestion, ear pain or sore throat. Denies chest congestion, productive cough or wheezing. Denies chest pains, palpitations and leg swelling Denies abdominal pain, nausea, vomiting,diarrhea or constipation.   Denies dysuria, frequency, hesitancy or incontinence. Denies joint pain, swelling and limitation in mobility. Denies headaches, seizures, numbness, or tingling. Increased stress due to domestic situation. Denies skin break down or rash.   PE  BP 118/78   Pulse 68   Resp 16   Ht 5' 10 (1.778 m)   Wt 182 lb 1.9 oz (82.6 kg)   SpO2 96%   BMI 26.13 kg/m   Patient alert and oriented and in no cardiopulmonary distress.  HEENT: No facial asymmetry, EOMI,     Neck supple .  Chest: Clear to auscultation bilaterally.  CVS: S1, S2 no murmurs, no S3.Regular rate.  ABD: Soft non tender.    Ext: No edema  MS: Adequate ROM spine, shoulders, hips and knees.  Skin: Intact, no ulcerations or rash noted.  Psych: Good eye contact, normal affect. Mildly  anxious , tearful at times CNS: CN 2-12 intact, power,  normal throughout.no focal deficits noted.   Assessment & Plan  HTN (hypertension) Possibly over corrected with c/o light headedness, stop azor , new is olmesartan  20 mg daily Nurse BP check ion 4 to 5 weeks DASH diet and commitment to daily physical activity for a minimum of 30 minutes discussed and encouraged, as a part of hypertension management. The importance of attaining a healthy weight is also discussed.     08/11/2023   11:39 AM 08/11/2023    8:41 AM 07/19/2023    8:10 AM 07/10/2023    9:47 AM 06/07/2023    8:12 AM 01/31/2023   10:44 AM 01/31/2023    9:45 AM  BP/Weight  Systolic BP 118 100 124 128 144 113 133  Diastolic BP 78 60 73 73 85 66 73  Wt. (Lbs)  182.12 207  207  207  BMI  26.13 kg/m2 29.7 kg/m2  29.7 kg/m2  29.7 kg/m2       Memory loss Reports worsenign symptoms in past 4 to 6 months, rept brain scan , caorotid doppler and neurology re eval  Type 2 diabetes mellitus with vascular disease (HCC) Diabetes associated with hypertension, hyperlipidemia, and arthritis  Bryan Rodgers is  reminded of the importance of commitment to daily physical activity for 30 minutes or more, as able and the need to limit carbohydrate intake to 30 to 60 grams per meal to help with blood sugar control.   The need to take medication as prescribed, test blood sugar as directed, and to call between visits if there is a concern that blood sugar is uncontrolled is also discussed.   Bryan Rodgers is reminded of the importance of daily foot exam, annual eye examination, and good blood sugar, blood pressure and cholesterol control.     Latest Ref Rng & Units 07/19/2023    8:38 AM 12/08/2022    3:00 PM 05/17/2022    1:48 PM 12/03/2021    9:55 AM 08/11/2021    9:21 AM  Diabetic  Labs  HbA1c 4.8 - 5.6 % 6.1  6.1  6.0  6.2    Micro/Creat Ratio 0 - 29 mg/g creat  50    14   Chol 100 - 199 mg/dL 94  94  95  86    HDL >60 mg/dL 43  51  38  40    Calc LDL 0 - 99 mg/dL 36  30  39  30    Triglycerides 0 - 149 mg/dL 67  52  93  79    Creatinine 0.76 - 1.27 mg/dL 8.96  9.04  8.82  8.85        08/11/2023   11:39 AM 08/11/2023    8:41 AM 07/19/2023    8:10 AM 07/10/2023    9:47 AM 06/07/2023    8:12 AM 01/31/2023   10:44 AM 01/31/2023    9:45 AM  BP/Weight  Systolic BP 118 100 124 128 144 113 133  Diastolic BP 78 60 73 73 85 66 73  Wt. (Lbs)  182.12 207  207  207  BMI  26.13 kg/m2 29.7 kg/m2  29.7 kg/m2  29.7 kg/m2      Latest Ref Rng & Units 01/13/2021   10:00 AM 06/26/2020   12:00 AM  Foot/eye exam completion dates  Eye Exam No Retinopathy  No Retinopathy      Foot Form Completion  Done      This result is from an external source.      Controlled, no change in medication   Unsteady gait when walking Reports increased imbalance with recent fall Update brain scan and neurology re eval  Elevated alkaline phosphatase level Has been referred in the past, no follow through, understands the need t keep appt, referred again as persists  Light headed Increasd symptoms x 4 months with falls reprted , carotid doppler and lower BP med  Insomnia Controlled, no change in medication Sleep hygiene reviewed and written information offered also. Prescription sent for  medication needed.   GERD (gastroesophageal reflux disease) Controlled, no change in medication

## 2023-08-11 NOTE — Patient Instructions (Signed)
 Annual exam mid to end November   Nurse BP check in 4 to 5 weeks  Stop azor , new is olmesartan  20 mg one daily, blood pressure is low  You are being  referred to Neurology as well as for carotid doppler and repeat brain scan due to new and worsening concerns  You are again referred to GI due to abnormality in blood work, elevated alk phos, important thatb you keep appointment  Thanks for choosing [facility], we consider it a privelige to serve you.

## 2023-08-12 ENCOUNTER — Encounter: Payer: Self-pay | Admitting: Family Medicine

## 2023-08-12 DIAGNOSIS — R42 Dizziness and giddiness: Secondary | ICD-10-CM | POA: Insufficient documentation

## 2023-08-12 DIAGNOSIS — R2681 Unsteadiness on feet: Secondary | ICD-10-CM | POA: Insufficient documentation

## 2023-08-12 NOTE — Assessment & Plan Note (Signed)
Controlled, no change in medication Sleep hygiene reviewed and written information offered also. Prescription sent for  medication needed.  

## 2023-08-12 NOTE — Assessment & Plan Note (Signed)
 Diabetes associated with hypertension, hyperlipidemia, and arthritis  Bryan Rodgers is reminded of the importance of commitment to daily physical activity for 30 minutes or more, as able and the need to limit carbohydrate intake to 30 to 60 grams per meal to help with blood sugar control.   The need to take medication as prescribed, test blood sugar as directed, and to call between visits if there is a concern that blood sugar is uncontrolled is also discussed.   Bryan Rodgers is reminded of the importance of daily foot exam, annual eye examination, and good blood sugar, blood pressure and cholesterol control.     Latest Ref Rng & Units 07/19/2023    8:38 AM 12/08/2022    3:00 PM 05/17/2022    1:48 PM 12/03/2021    9:55 AM 08/11/2021    9:21 AM  Diabetic Labs  HbA1c 4.8 - 5.6 % 6.1  6.1  6.0  6.2    Micro/Creat Ratio 0 - 29 mg/g creat  50    14   Chol 100 - 199 mg/dL 94  94  95  86    HDL >60 mg/dL 43  51  38  40    Calc LDL 0 - 99 mg/dL 36  30  39  30    Triglycerides 0 - 149 mg/dL 67  52  93  79    Creatinine 0.76 - 1.27 mg/dL 8.96  9.04  8.82  8.85        08/11/2023   11:39 AM 08/11/2023    8:41 AM 07/19/2023    8:10 AM 07/10/2023    9:47 AM 06/07/2023    8:12 AM 01/31/2023   10:44 AM 01/31/2023    9:45 AM  BP/Weight  Systolic BP 118 100 124 128 144 113 133  Diastolic BP 78 60 73 73 85 66 73  Wt. (Lbs)  182.12 207  207  207  BMI  26.13 kg/m2 29.7 kg/m2  29.7 kg/m2  29.7 kg/m2      Latest Ref Rng & Units 01/13/2021   10:00 AM 06/26/2020   12:00 AM  Foot/eye exam completion dates  Eye Exam No Retinopathy  No Retinopathy      Foot Form Completion  Done      This result is from an external source.      Controlled, no change in medication

## 2023-08-12 NOTE — Assessment & Plan Note (Signed)
 Controlled, no change in medication

## 2023-08-12 NOTE — Assessment & Plan Note (Signed)
 Reports increased imbalance with recent fall Update brain scan and neurology re eval

## 2023-08-12 NOTE — Assessment & Plan Note (Addendum)
 Possibly over corrected with c/o light headedness, stop azor , new is olmesartan  20 mg daily Nurse BP check ion 4 to 5 weeks DASH diet and commitment to daily physical activity for a minimum of 30 minutes discussed and encouraged, as a part of hypertension management. The importance of attaining a healthy weight is also discussed.     08/11/2023   11:39 AM 08/11/2023    8:41 AM 07/19/2023    8:10 AM 07/10/2023    9:47 AM 06/07/2023    8:12 AM 01/31/2023   10:44 AM 01/31/2023    9:45 AM  BP/Weight  Systolic BP 118 100 124 128 144 113 133  Diastolic BP 78 60 73 73 85 66 73  Wt. (Lbs)  182.12 207  207  207  BMI  26.13 kg/m2 29.7 kg/m2  29.7 kg/m2  29.7 kg/m2

## 2023-08-12 NOTE — Assessment & Plan Note (Signed)
 Has been referred in the past, no follow through, understands the need t keep appt, referred again as persists

## 2023-08-12 NOTE — Assessment & Plan Note (Addendum)
 Reports worsenign symptoms in past 4 to 6 months, rept brain scan , caorotid doppler and neurology re eval

## 2023-08-12 NOTE — Assessment & Plan Note (Signed)
 Increasd symptoms x 4 months with falls reprted , carotid doppler and lower BP med

## 2023-08-14 ENCOUNTER — Telehealth: Payer: Self-pay | Admitting: Orthopaedic Surgery

## 2023-08-21 ENCOUNTER — Telehealth: Payer: Self-pay | Admitting: Orthopaedic Surgery

## 2023-08-21 ENCOUNTER — Encounter (HOSPITAL_COMMUNITY): Payer: Self-pay | Admitting: Emergency Medicine

## 2023-08-21 ENCOUNTER — Ambulatory Visit (HOSPITAL_COMMUNITY)
Admission: RE | Admit: 2023-08-21 | Discharge: 2023-08-21 | Disposition: A | Discharge status: Home / Self Care | Source: Ambulatory Visit | Attending: Family Medicine | Admitting: Family Medicine

## 2023-08-21 ENCOUNTER — Other Ambulatory Visit: Payer: Self-pay

## 2023-08-21 ENCOUNTER — Emergency Department (HOSPITAL_COMMUNITY)
Admission: EM | Admit: 2023-08-21 | Discharge: 2023-08-21 | Disposition: A | Discharge status: Home / Self Care | Attending: Emergency Medicine | Admitting: Emergency Medicine

## 2023-08-21 DIAGNOSIS — S30861A Insect bite (nonvenomous) of abdominal wall, initial encounter: Secondary | ICD-10-CM | POA: Insufficient documentation

## 2023-08-21 DIAGNOSIS — I10 Essential (primary) hypertension: Secondary | ICD-10-CM | POA: Diagnosis not present

## 2023-08-21 DIAGNOSIS — R413 Other amnesia: Secondary | ICD-10-CM | POA: Diagnosis not present

## 2023-08-21 DIAGNOSIS — Z7982 Long term (current) use of aspirin: Secondary | ICD-10-CM | POA: Diagnosis not present

## 2023-08-21 DIAGNOSIS — J45909 Unspecified asthma, uncomplicated: Secondary | ICD-10-CM | POA: Insufficient documentation

## 2023-08-21 DIAGNOSIS — Z79899 Other long term (current) drug therapy: Secondary | ICD-10-CM | POA: Insufficient documentation

## 2023-08-21 DIAGNOSIS — W57XXXA Bitten or stung by nonvenomous insect and other nonvenomous arthropods, initial encounter: Secondary | ICD-10-CM | POA: Diagnosis not present

## 2023-08-21 DIAGNOSIS — I6782 Cerebral ischemia: Secondary | ICD-10-CM | POA: Diagnosis not present

## 2023-08-21 DIAGNOSIS — S40861A Insect bite (nonvenomous) of right upper arm, initial encounter: Secondary | ICD-10-CM | POA: Diagnosis not present

## 2023-08-21 DIAGNOSIS — R2681 Unsteadiness on feet: Secondary | ICD-10-CM

## 2023-08-21 DIAGNOSIS — R410 Disorientation, unspecified: Secondary | ICD-10-CM | POA: Diagnosis not present

## 2023-08-21 DIAGNOSIS — Z87442 Personal history of urinary calculi: Secondary | ICD-10-CM | POA: Insufficient documentation

## 2023-08-21 MED ORDER — DOXYCYCLINE HYCLATE 100 MG PO CAPS: 100.0000 mg | ORAL_CAPSULE | Freq: Two times a day (BID) | ORAL | 0 refills | Status: DC

## 2023-08-21 NOTE — ED Provider Notes (Signed)
  EMERGENCY DEPARTMENT AT [facility] Provider Note   CSN: [id number] Arrival date & time: 08/21/23  1305     Patient presents with: Insect Bite   Bryan Rodgers is a 63 y.o. male.   HPI Patient presents with tick bites.  Had 2 bites yesterday on right flank.  Unsure if he got the head out.  No fevers or chills.  States that [employer] not moving when he pulled them off.   Past Medical History:  Diagnosis Date   Alcoholic (HCC) quit 2014   10-15 beers per day on the weekends    Anemia    Arthritis    Asthma    childhood asthma up to age 66   Chest pain    Palpations   Dyspnea    Elevated CPK    GERD (gastroesophageal reflux disease)    Hallucination    Headache    Hemorrhoids    with bleeding    History of colonic polyps    removed via colonscope   Hypertension    Kidney stone    Migraine    Neuromuscular disorder (HCC)    Overweight (BMI 25.0-29.9)    Pneumonia    Prediabetes 10/2012   HBA1C is 6.4   Tobacco abuse     Prior to Admission medications   Medication Sig Start Date End Date Taking? Authorizing Provider  doxycycline  (VIBRAMYCIN ) 100 MG capsule Take 1 capsule (100 mg total) by mouth 2 (two) times daily. 08/21/23  Yes Patsey Lot, MD  oxyCODONE -acetaminophen  (PERCOCET/ROXICET) 5-325 MG tablet Take 1 tablet by mouth every 6 (six) hours as needed for severe pain (pain score 7-10). 08/14/23   Brenna Lin, MD  acetaminophen  (TYLENOL  8 HOUR) 650 MG CR tablet Take one tablet by mouth  one to two times daily for arthritis pain 05/17/22   Antonetta Rollene BRAVO, MD  albuterol  (VENTOLIN  HFA) 108 (90 Base) MCG/ACT inhaler Inhale 2 puffs into the lungs every 6 (six) hours as needed for wheezing or shortness of breath. 08/11/23   Antonetta Rollene BRAVO, MD  aspirin  EC 81 MG tablet Take 1 tablet (81 mg total) by mouth at bedtime. 09/01/20   Lanis Pupa, MD  blood glucose meter kit and supplies Dispense based on patient and insurance preference.  Once daily testing DX E11.9 11/18/20   Antonetta Rollene BRAVO, MD  cyclobenzaprine  (FLEXERIL ) 10 MG tablet Take 1 tablet (10 mg total) by mouth at bedtime. One tablet every night at bedtime as needed for spasm. 05/17/23   Brenna Lin, MD  glucose blood test strip Use as instructed once daily dx e11.9 11/18/20   Antonetta Rollene BRAVO, MD  lamoTRIgine  (LAMICTAL ) 100 MG tablet TAKE ONE TABLET BY MOUTH TWICE DAILY 05/29/23   Antonetta Rollene BRAVO, MD  Lancets 30G MISC Once daily testing dx e11.9 11/18/20   Antonetta Rollene BRAVO, MD  naproxen  (NAPROSYN ) 500 MG tablet TAKE ONE TABLET BY MOUTH TWICE DAILY WITH MEALS 05/29/23   Brenna Lin, MD  olmesartan  (BENICAR ) 20 MG tablet Take 1 tablet (20 mg total) by mouth daily. 08/11/23   Antonetta Rollene BRAVO, MD  omeprazole  (PRILOSEC) 40 MG capsule TAKE ONE CAPSULE BY MOUTH EVERY DAY 11/21/22   Antonetta Rollene BRAVO, MD  OZEMPIC , 0.25 OR 0.5 MG/DOSE, 2 MG/3ML SOPN inject 0.5mg  SUBCUTANEOUSLY WEEKLY 04/26/23   Antonetta Rollene BRAVO, MD  rosuvastatin  (CRESTOR ) 5 MG tablet TAKE 1 TABLET BY MOUTH ON MONDAY, WEDNESDAY, AND FRIDAY AT BEDTIME. 08/11/23   Antonetta Rollene BRAVO, MD  temazepam  (RESTORIL ) 30 MG capsule Take 1 capsule (30 mg total) by mouth at bedtime. 01/19/23   Antonetta Rollene BRAVO, MD  temazepam  (RESTORIL ) 30 MG capsule Take 1 capsule (30 mg total) by mouth at bedtime as needed for sleep. 08/11/23   Antonetta Rollene BRAVO, MD  triamcinolone  (KENALOG ) 0.025 % ointment APPLY TO THE AFFECTED AREA(S) TWICE DAILY AS NEEDED ECZEMA/RASHES 07/04/22   Antonetta Rollene BRAVO, MD    Allergies: Simvastatin     Review of Systems  Updated Vital Signs BP (!) 145/72 (BP Location: Right Arm)   Pulse 72   Temp 98.1 F (36.7 C) (Oral)   Resp 16   Ht 5' 10 (1.778 m)   Wt 82.6 kg   SpO2 100%   BMI 26.13 kg/m   Physical Exam Vitals and nursing note reviewed.  Abdominal:     Tenderness: There is no abdominal tenderness.  Skin:    Comments: On right flank is 2 separate red somewhat  indurated raised areas each under centimeter around.  No pieces of tick seen.  Neurological:     Mental Status: He is alert.     (all labs ordered are listed, but only abnormal results are displayed) Labs Reviewed - No data to display  EKG: None  Radiology: No results found.   Procedures   Medications Ordered in the ED - No data to display                                  Medical Decision Making  Patient with tick bites.  Right flank.  Has 2 raised areas with erythema.  No definite pieces of the tick seen in it.  However with the erythema and raised nature of it we will treat with doxycycline .  Does not appear to be a drainable abscess at this time.  Discharge home.     Final diagnoses:  Tick bite, unspecified site, initial encounter    ED Discharge Orders          Ordered    doxycycline  (VIBRAMYCIN ) 100 MG capsule  2 times daily        08/21/23 1346               Patsey Lot, MD 08/21/23 1346

## 2023-08-21 NOTE — Telephone Encounter (Signed)
 DR. BRENNA   Patient request refill on his pain medicine  oxyCODONE -acetaminophen  (PERCOCET/ROXICET) 5-325 MG tablet   Pharmacy:  [facility]

## 2023-08-21 NOTE — ED Triage Notes (Signed)
 Pt to the ED with complaints of several tick bites on his back. Pt is concerned he may not have gotten the head out.

## 2023-08-24 ENCOUNTER — Ambulatory Visit: Admitting: Physical Medicine and Rehabilitation

## 2023-08-24 ENCOUNTER — Other Ambulatory Visit: Payer: Self-pay

## 2023-08-24 ENCOUNTER — Other Ambulatory Visit: Payer: Self-pay | Admitting: Family Medicine

## 2023-08-24 VITALS — BP 137/79 | HR 76

## 2023-08-24 DIAGNOSIS — M47817 Spondylosis without myelopathy or radiculopathy, lumbosacral region: Secondary | ICD-10-CM

## 2023-08-24 MED ORDER — BUPIVACAINE HCL 0.5 % IJ SOLN
3.0000 mL | Freq: Once | INTRAMUSCULAR | Status: AC
  Administered 2023-08-24: 3 mL

## 2023-08-24 NOTE — Progress Notes (Signed)
 Pain Scale   Average Pain 7 Patient advising he has lower back pain radiating to his left hip area. Patient advising his pain increases when walking and standing and decreases when he sits.        +Driver, -BT, -Dye Allergies.

## 2023-08-24 NOTE — Patient Instructions (Signed)

## 2023-08-25 ENCOUNTER — Encounter: Payer: Self-pay | Admitting: Physical Medicine and Rehabilitation

## 2023-08-25 ENCOUNTER — Other Ambulatory Visit: Payer: Self-pay | Admitting: Physical Medicine and Rehabilitation

## 2023-08-25 ENCOUNTER — Ambulatory Visit: Payer: Self-pay | Admitting: Family Medicine

## 2023-08-25 DIAGNOSIS — G8929 Other chronic pain: Secondary | ICD-10-CM

## 2023-08-25 DIAGNOSIS — M47817 Spondylosis without myelopathy or radiculopathy, lumbosacral region: Secondary | ICD-10-CM

## 2023-08-25 DIAGNOSIS — M47816 Spondylosis without myelopathy or radiculopathy, lumbar region: Secondary | ICD-10-CM

## 2023-08-28 ENCOUNTER — Telehealth: Payer: Self-pay | Admitting: Orthopaedic Surgery

## 2023-08-28 ENCOUNTER — Other Ambulatory Visit: Payer: Self-pay | Admitting: Family Medicine

## 2023-08-28 NOTE — Progress Notes (Signed)
 Bryan Rodgers - 63 y.o. male MRN [id number]  Date of birth: 03-02-1960  Office Visit Note: Visit Date: 08/24/2023 PCP: Antonetta Rollene FORBES, MD Referred by: Antonetta Rollene FORBES, MD  Subjective: Chief Complaint  Patient presents with   Lower Back - Pain   HPI:  Bryan Rodgers is a 63 y.o. male who comes in today for planned repeat Left L4-5 and L5-S1 Lumbar facet/medial branch block with fluoroscopic guidance.  The patient has failed conservative care including home exercise, medications, time and activity modification.  This injection will be diagnostic and hopefully therapeutic.  Please see requesting physician notes for further details and justification.  Exam shows concordant low back pain with facet joint loading and extension. Patient received more than 80% pain relief from prior injection. This would be the second block in a diagnostic double block paradigm.     Referring:Megan Trudy, FNP   ROS Otherwise per HPI.  Assessment & Plan: Visit Diagnoses:    ICD-10-CM   1. Spondylosis without myelopathy or radiculopathy, lumbosacral region  M47.817 XR C-ARM NO REPORT    Nerve Block    bupivacaine  (MARCAINE ) 0.5 % (with pres) injection 3 mL      Plan: No additional findings.   Meds & Orders:  Meds ordered this encounter  Medications   bupivacaine  (MARCAINE ) 0.5 % (with pres) injection 3 mL    Orders Placed This Encounter  Procedures   Nerve Block   XR C-ARM NO REPORT    Follow-up: Return for Review Pain Diary.   Procedures: No procedures performed  Lumbar Diagnostic Facet Joint Nerve Block with Fluoroscopic Guidance   Patient: Bryan Rodgers      Date of Birth: 1960-04-12 MRN: [id number] PCP: Antonetta Rollene FORBES, MD      Visit Date: 08/24/2023   Universal Protocol:    Date/Time: 07/28/256:32 AM  Consent Given By: the patient  Position: PRONE  Additional Comments: Vital signs were monitored before and after the procedure. Patient was prepped and  draped in the usual sterile fashion. The correct patient, procedure, and site was verified.   Injection Procedure Details:   Procedure diagnoses:  1. Spondylosis without myelopathy or radiculopathy, lumbosacral region      Meds Administered:  Meds ordered this encounter  Medications   bupivacaine  (MARCAINE ) 0.5 % (with pres) injection 3 mL     Laterality: Left  Location/Site: L4-L5, L3 and L4 medial branches and L5-S1, L4 medial branch and L5 dorsal ramus  Needle: 5.0 in., 25 ga.  Short bevel or Quincke spinal needle  Needle Placement: Oblique pedical  Findings:   -Comments: There was excellent flow of contrast along the articular pillars without intravascular flow.  Procedure Details: The fluoroscope beam is vertically oriented in AP and then obliqued 15 to 20 degrees to the ipsilateral side of the desired nerve to achieve the "Scotty dog" appearance.  The skin over the target area of the junction of the superior articulating process and the transverse process (sacral ala if blocking the L5 dorsal rami) was locally anesthetized with a 1 ml volume of 1% Lidocaine  without Epinephrine .  The spinal needle was inserted and advanced in a trajectory view down to the target.   After contact with periosteum and negative aspirate for blood and CSF, correct placement without intravascular or epidural spread was confirmed by injecting 0.5 ml. of Isovue-250.  A spot radiograph was obtained of this image.    Next, a 0.5 ml. volume of the injectate described above was  injected. The needle was then redirected to the other facet joint nerves mentioned above if needed.  Prior to the procedure, the patient was given a Pain Diary which was completed for baseline measurements.  After the procedure, the patient rated their pain every 30 minutes and will continue rating at this frequency for a total of 5 hours.  The patient has been asked to complete the Diary and return to us  by mail, fax or hand  delivered as soon as possible.   Additional Comments:  The patient tolerated the procedure well Dressing: 2 x 2 sterile gauze and Band-Aid    Post-procedure details: Patient was observed during the procedure. Post-procedure instructions were reviewed.  Patient left the clinic in stable condition.   Clinical History: CLINICAL DATA:  Chronic lower back pain.   EXAM: MRI LUMBAR SPINE WITHOUT CONTRAST   TECHNIQUE: Multiplanar, multisequence MR imaging of the lumbar spine was performed. No intravenous contrast was administered.   COMPARISON:  MRI lumbar spine 08/23/2020.   FINDINGS: Segmentation:  Standard.   Alignment: Lumbar lordosis is maintained. Grade 1 anterolisthesis of L4 on L5 and of L5 on S1 which appears slightly increased from the prior study.   Vertebrae: Degenerative endplate changes and discogenic edema at T12-L1. No evidence of acute fracture. Vertebral body heights are maintained. Edema noted involving the left facets at L4-5 and L5-S1 likely related to facet arthrosis. There is additional bone marrow edema within the sacrum involving the left sacral ala. Degenerative changes of the sacroiliac joints, left greater than right, similar to prior.   Conus medullaris and cauda equina: Conus extends to the L1-2 level. Conus and cauda equina appear normal.   Paraspinal and other soft tissues: 5 mm cyst adjacent to the left facets at L4-5 projecting into the posterior paraspinal musculature compatible with synovial cyst. The paraspinal soft tissues are otherwise unremarkable. Cyst in the left kidney measuring less than 1 cm in diameter.   Disc levels:   T12-L1: Disc bulge which indents the ventral thecal sac without significant spinal canal stenosis. Mild facet arthrosis. Mild to moderate foraminal stenosis on the left.   L1-2: Minimal disc bulge. Mild facet arthrosis. No significant spinal canal or foraminal stenosis.   L2-3: Minimal disc bulge. Mild  facet arthrosis. No significant spinal canal stenosis. No significant foraminal stenosis.   L3-4: No significant disc bulge or spinal canal stenosis. Mild facet arthrosis. There is mild bilateral foraminal stenosis.   L4-5: Grade 1 anterolisthesis with partial uncovering of the disc. Mild disc height loss. Minimal disc bulge. No significant spinal canal stenosis. Severe facet arthrosis bilaterally with synovial cyst noted on the left without spinal canal narrowing. There is no significant foraminal stenosis.   L5-S1: Grade 1 anterolisthesis with partial uncovering of the disc. Mild disc height loss. Diffuse disc bulge without significant spinal canal stenosis. Severe facet arthrosis. There is moderate right and mild left foraminal stenosis secondary to disc bulge and facet osteophytes. The previously noted synovial cyst involving the right foramen is not visualized on the current study.   IMPRESSION: Degenerative changes of the lumbar spine as above. No high-grade spinal canal stenosis. Multilevel foraminal stenosis, greatest and moderate on the right at L5-S1.   Previously noted synovial cyst involving the right neural foramen at L5-S1 is not appreciated on the current study.   Facet arthrosis throughout the lumbar spine, greatest and severe at L4-5 and L5-S1.   Grade 1 anterolisthesis of L4 on L5 and of L5 on S1, slightly increased  from prior and likely related to chronic facet arthrosis.   Edema involving the left sacral ala which may be reactive in the setting of prominent left sacroiliac joint degenerative changes. Recommend correlation with history of trauma and consider CT of the sacrum/pelvis if concern for fracture.     Electronically Signed   By: Donnice Mania M.D.   On: 06/06/2023 09:19     Objective:  VS:  HT:    WT:   BMI:     BP:137/79  HR:76bpm  TEMP: ( )  RESP:  Physical Exam Vitals and nursing note reviewed.  Constitutional:      General: He is  not in acute distress.    Appearance: Normal appearance. He is not ill-appearing.  HENT:     Head: Normocephalic and atraumatic.     Right Ear: External ear normal.     Left Ear: External ear normal.     Nose: No congestion.  Eyes:     Extraocular Movements: Extraocular movements intact.  Cardiovascular:     Rate and Rhythm: Normal rate.     Pulses: Normal pulses.  Pulmonary:     Effort: Pulmonary effort is normal. No respiratory distress.  Abdominal:     General: There is no distension.     Palpations: Abdomen is soft.  Musculoskeletal:        General: No tenderness or signs of injury.     Cervical back: Neck supple.     Right lower leg: No edema.     Left lower leg: No edema.     Comments: Patient has good distal strength without clonus.  Skin:    Findings: No erythema or rash.  Neurological:     General: No focal deficit present.     Mental Status: He is alert and oriented to person, place, and time.     Sensory: No sensory deficit.     Motor: No weakness or abnormal muscle tone.     Coordination: Coordination normal.  Psychiatric:        Mood and Affect: Mood normal.        Behavior: Behavior normal.      Imaging: No results found.

## 2023-08-28 NOTE — Procedures (Signed)
 Lumbar Diagnostic Facet Joint Nerve Block with Fluoroscopic Guidance   Patient: Bryan Rodgers      Date of Birth: September 03, 1960 MRN: [id number] PCP: Antonetta Rollene FORBES, MD      Visit Date: 08/24/2023   Universal Protocol:    Date/Time: 07/28/256:32 AM  Consent Given By: the patient  Position: PRONE  Additional Comments: Vital signs were monitored before and after the procedure. Patient was prepped and draped in the usual sterile fashion. The correct patient, procedure, and site was verified.   Injection Procedure Details:   Procedure diagnoses:  1. Spondylosis without myelopathy or radiculopathy, lumbosacral region      Meds Administered:  Meds ordered this encounter  Medications   bupivacaine  (MARCAINE ) 0.5 % (with pres) injection 3 mL     Laterality: Left  Location/Site: L4-L5, L3 and L4 medial branches and L5-S1, L4 medial branch and L5 dorsal ramus  Needle: 5.0 in., 25 ga.  Short bevel or Quincke spinal needle  Needle Placement: Oblique pedical  Findings:   -Comments: There was excellent flow of contrast along the articular pillars without intravascular flow.  Procedure Details: The fluoroscope beam is vertically oriented in AP and then obliqued 15 to 20 degrees to the ipsilateral side of the desired nerve to achieve the "Scotty dog" appearance.  The skin over the target area of the junction of the superior articulating process and the transverse process (sacral ala if blocking the L5 dorsal rami) was locally anesthetized with a 1 ml volume of 1% Lidocaine  without Epinephrine .  The spinal needle was inserted and advanced in a trajectory view down to the target.   After contact with periosteum and negative aspirate for blood and CSF, correct placement without intravascular or epidural spread was confirmed by injecting 0.5 ml. of Isovue-250.  A spot radiograph was obtained of this image.    Next, a 0.5 ml. volume of the injectate described above was injected. The  needle was then redirected to the other facet joint nerves mentioned above if needed.  Prior to the procedure, the patient was given a Pain Diary which was completed for baseline measurements.  After the procedure, the patient rated their pain every 30 minutes and will continue rating at this frequency for a total of 5 hours.  The patient has been asked to complete the Diary and return to us  by mail, fax or hand delivered as soon as possible.   Additional Comments:  The patient tolerated the procedure well Dressing: 2 x 2 sterile gauze and Band-Aid    Post-procedure details: Patient was observed during the procedure. Post-procedure instructions were reviewed.  Patient left the clinic in stable condition.

## 2023-08-30 ENCOUNTER — Encounter: Payer: Self-pay | Admitting: Orthopaedic Surgery

## 2023-08-30 ENCOUNTER — Ambulatory Visit: Admitting: Orthopaedic Surgery

## 2023-08-30 DIAGNOSIS — G8929 Other chronic pain: Secondary | ICD-10-CM

## 2023-08-30 DIAGNOSIS — M545 Low back pain, unspecified: Secondary | ICD-10-CM | POA: Diagnosis not present

## 2023-08-30 NOTE — Progress Notes (Signed)
 I saw Dr. Eldonna  He had the second injection by Dr. Eldonna last week.  It lasted several days.  He is scheduled for a third injection in a few weeks.  He has no new trauma.  He has no weakness.  Lumbar area is tender diffusely, NV intact, ROM decreased, muscle tone and strength normal.  Encounter Diagnosis  Name Primary?   Chronic left-sided low back pain without sciatica Yes   I will refill pain medicine.  I have reviewed the [place]  Controlled Substance Reporting System web site prior to prescribing narcotic medicine for this patient.  Return in six weeks.  Call if any problem.  Precautions discussed.  Electronically Signed Lemond Stable, MD 7/30/202511:39 AM

## 2023-09-01 ENCOUNTER — Other Ambulatory Visit: Payer: Self-pay | Admitting: Physical Medicine and Rehabilitation

## 2023-09-01 MED ORDER — DIAZEPAM 5 MG PO TABS: ORAL_TABLET | ORAL | 0 refills | Status: DC

## 2023-09-06 ENCOUNTER — Telehealth: Payer: Self-pay | Admitting: Orthopaedic Surgery

## 2023-09-08 ENCOUNTER — Ambulatory Visit

## 2023-09-08 DIAGNOSIS — I10 Essential (primary) hypertension: Secondary | ICD-10-CM

## 2023-09-08 NOTE — Progress Notes (Signed)
 Patient is in office today for a nurse visit for Blood Pressure Check. Patients Blood Pressure was 138/69.

## 2023-09-13 ENCOUNTER — Telehealth: Payer: Self-pay | Admitting: Orthopaedic Surgery

## 2023-09-20 ENCOUNTER — Telehealth: Payer: Self-pay | Admitting: Orthopaedic Surgery

## 2023-09-20 ENCOUNTER — Other Ambulatory Visit: Payer: Self-pay | Admitting: Orthopaedic Surgery

## 2023-09-20 NOTE — Telephone Encounter (Signed)
Dr. Sanjuan Dame pt - pt lvm requesting a refill for Oxycodone 5-325 to be sent to [facility].

## 2023-09-22 ENCOUNTER — Encounter: Payer: Self-pay | Admitting: Radiology

## 2023-09-26 ENCOUNTER — Ambulatory Visit: Admitting: Physical Medicine and Rehabilitation

## 2023-09-26 ENCOUNTER — Other Ambulatory Visit: Payer: Self-pay

## 2023-09-26 DIAGNOSIS — M47816 Spondylosis without myelopathy or radiculopathy, lumbar region: Secondary | ICD-10-CM

## 2023-09-26 MED ORDER — METHYLPREDNISOLONE ACETATE 40 MG/ML IJ SUSP
40.0000 mg | Freq: Once | INTRAMUSCULAR | Status: AC
  Administered 2023-09-26: 40 mg

## 2023-09-26 NOTE — Progress Notes (Signed)
 Pain Scale   Average Pain 8 Patient advising he has lower back pain radiating to left hip area when standing and waling.        +Driver, -BT, -Dye Allergies.

## 2023-09-26 NOTE — Progress Notes (Signed)
 Bryan Rodgers - 63 y.o. male MRN [id number]  Date of birth: [birth year]  Office Visit Note: Visit Date: 09/26/2023 PCP: Antonetta Rollene FORBES, MD Referred by: Antonetta Rollene FORBES, MD  Subjective: Chief Complaint  Patient presents with   Lower Back - Pain   HPI:  Bryan Rodgers is a 63 y.o. male who comes in todayfor planned radiofrequency ablation of the Left L4-5 and L5-S1 Lumbar facet joints. This would be ablation of the corresponding medial branches and/or dorsal rami.  Patient has had double diagnostic blocks with more than 50% relief.  These are documented on pain diary.  They have had chronic back pain for quite some time, more than 3 months, which has been an ongoing situation with recalcitrant axial back pain.  They have no radicular pain.  Their axial pain is worse with standing and ambulating and on exam today with facet loading.  They have had physical therapy as well as home exercise program.  The imaging noted in the chart below indicated facet pathology. Accordingly they meet all the criteria and qualification for for radiofrequency ablation and we are going to complete this today hopefully for more longer term relief as part of comprehensive management program.   ROS Otherwise per HPI.  Assessment & Plan: Visit Diagnoses:    ICD-10-CM   1. Spondylosis without myelopathy or radiculopathy, lumbar region  M47.816 XR C-ARM NO REPORT    Radiofrequency,Lumbar    methylPREDNISolone  acetate (DEPO-MEDROL ) injection 40 mg      Plan: No additional findings.   Meds & Orders:  Meds ordered this encounter  Medications   methylPREDNISolone  acetate (DEPO-MEDROL ) injection 40 mg    Orders Placed This Encounter  Procedures   Radiofrequency,Lumbar   XR C-ARM NO REPORT    Follow-up: Return for visit to requesting provider as needed.   Procedures: No procedures performed  Lumbar Facet Joint Nerve Denervation  Patient: Bryan Rodgers      Date of Birth: 1960/06/07 MRN:  [id number] PCP: Antonetta Rollene FORBES, MD      Visit Date: 09/26/2023   Universal Protocol:    Date/Time: 08/26/258:44 PM  Consent Given By: the patient  Position: PRONE  Additional Comments: Vital signs were monitored before and after the procedure. Patient was prepped and draped in the usual sterile fashion. The correct patient, procedure, and site was verified.   Injection Procedure Details:   Procedure diagnoses:  1. Spondylosis without myelopathy or radiculopathy, lumbar region      Meds Administered:  Meds ordered this encounter  Medications   methylPREDNISolone  acetate (DEPO-MEDROL ) injection 40 mg     Laterality: Left  Location/Site:  L4-L5, L3 and L4 medial branches and L5-S1, L4 medial branch and L5 dorsal ramus  Needle: 18 ga.,  10mm active tip, RF Cannula  Needle Placement: Along juncture of superior articular process and transverse pocess  Findings:  -Comments:  Procedure Details: For each desired target nerve, the corresponding transverse process (sacral ala for the L5 dorsal rami) was identified and the fluoroscope was positioned to square off the endplates of the corresponding vertebral body to achieve a true AP midline view.  The beam was then obliqued 15 to 20 degrees and caudally tilted 15 to 20 degrees to line up a trajectory along the target nerves. The skin over the target of the junction of superior articulating process and transverse process (sacral ala for the L5 dorsal rami) was infiltrated with 1ml of 1% Lidocaine  without Epinephrine .  The 18 gauge  10mm active tip outer cannula was advanced in trajectory view to the target.  This procedure was repeated for each target nerve.  Then, for all levels, the outer cannula placement was fine-tuned and the position was then confirmed with bi-planar imaging.    Test stimulation was done both at sensory and motor levels to ensure there was no radicular stimulation. The target tissues were then  infiltrated with 1 ml of 1% Lidocaine  without Epinephrine . Subsequently, a percutaneous neurotomy was carried out for 90 seconds at 80 degrees Celsius.  After the completion of the lesion, 1 ml of injectate was delivered. It was then repeated for each facet joint nerve mentioned above. Appropriate radiographs were obtained to verify the probe placement during the neurotomy.   Additional Comments:  The patient tolerated the procedure well Dressing: 2 x 2 sterile gauze and Band-Aid    Post-procedure details: Patient was observed during the procedure. Post-procedure instructions were reviewed.  Patient left the clinic in stable condition.      Clinical History: CLINICAL DATA:  Chronic lower back pain.   EXAM: MRI LUMBAR SPINE WITHOUT CONTRAST   TECHNIQUE: Multiplanar, multisequence MR imaging of the lumbar spine was performed. No intravenous contrast was administered.   COMPARISON:  MRI lumbar spine 08/23/2020.   FINDINGS: Segmentation:  Standard.   Alignment: Lumbar lordosis is maintained. Grade 1 anterolisthesis of L4 on L5 and of L5 on S1 which appears slightly increased from the prior study.   Vertebrae: Degenerative endplate changes and discogenic edema at T12-L1. No evidence of acute fracture. Vertebral body heights are maintained. Edema noted involving the left facets at L4-5 and L5-S1 likely related to facet arthrosis. There is additional bone marrow edema within the sacrum involving the left sacral ala. Degenerative changes of the sacroiliac joints, left greater than right, similar to prior.   Conus medullaris and cauda equina: Conus extends to the L1-2 level. Conus and cauda equina appear normal.   Paraspinal and other soft tissues: 5 mm cyst adjacent to the left facets at L4-5 projecting into the posterior paraspinal musculature compatible with synovial cyst. The paraspinal soft tissues are otherwise unremarkable. Cyst in the left kidney measuring less  than 1 cm in diameter.   Disc levels:   T12-L1: Disc bulge which indents the ventral thecal sac without significant spinal canal stenosis. Mild facet arthrosis. Mild to moderate foraminal stenosis on the left.   L1-2: Minimal disc bulge. Mild facet arthrosis. No significant spinal canal or foraminal stenosis.   L2-3: Minimal disc bulge. Mild facet arthrosis. No significant spinal canal stenosis. No significant foraminal stenosis.   L3-4: No significant disc bulge or spinal canal stenosis. Mild facet arthrosis. There is mild bilateral foraminal stenosis.   L4-5: Grade 1 anterolisthesis with partial uncovering of the disc. Mild disc height loss. Minimal disc bulge. No significant spinal canal stenosis. Severe facet arthrosis bilaterally with synovial cyst noted on the left without spinal canal narrowing. There is no significant foraminal stenosis.   L5-S1: Grade 1 anterolisthesis with partial uncovering of the disc. Mild disc height loss. Diffuse disc bulge without significant spinal canal stenosis. Severe facet arthrosis. There is moderate right and mild left foraminal stenosis secondary to disc bulge and facet osteophytes. The previously noted synovial cyst involving the right foramen is not visualized on the current study.   IMPRESSION: Degenerative changes of the lumbar spine as above. No high-grade spinal canal stenosis. Multilevel foraminal stenosis, greatest and moderate on the right at L5-S1.   Previously noted synovial  cyst involving the right neural foramen at L5-S1 is not appreciated on the current study.   Facet arthrosis throughout the lumbar spine, greatest and severe at L4-5 and L5-S1.   Grade 1 anterolisthesis of L4 on L5 and of L5 on S1, slightly increased from prior and likely related to chronic facet arthrosis.   Edema involving the left sacral ala which may be reactive in the setting of prominent left sacroiliac joint degenerative changes. Recommend  correlation with history of trauma and consider CT of the sacrum/pelvis if concern for fracture.     Electronically Signed   By: Donnice Mania M.D.   On: 06/06/2023 09:19     Objective:  VS:  HT:    WT:   BMI:     BP:   HR: bpm  TEMP: ( )  RESP:  Physical Exam Vitals and nursing note reviewed.  Constitutional:      General: He is not in acute distress.    Appearance: Normal appearance. He is not ill-appearing.  HENT:     Head: Normocephalic and atraumatic.     Right Ear: External ear normal.     Left Ear: External ear normal.     Nose: No congestion.  Eyes:     Extraocular Movements: Extraocular movements intact.  Cardiovascular:     Rate and Rhythm: Normal rate.     Pulses: Normal pulses.  Pulmonary:     Effort: Pulmonary effort is normal. No respiratory distress.  Abdominal:     General: There is no distension.     Palpations: Abdomen is soft.  Musculoskeletal:        General: No tenderness or signs of injury.     Cervical back: Neck supple.     Right lower leg: No edema.     Left lower leg: No edema.     Comments: Patient has good distal strength without clonus.  Skin:    Findings: No erythema or rash.  Neurological:     General: No focal deficit present.     Mental Status: He is alert and oriented to person, place, and time.     Sensory: No sensory deficit.     Motor: No weakness or abnormal muscle tone.     Coordination: Coordination normal.  Psychiatric:        Mood and Affect: Mood normal.        Behavior: Behavior normal.      Imaging: XR C-ARM NO REPORT Result Date: 09/26/2023 Please see Notes tab for imaging impression.

## 2023-09-26 NOTE — Procedures (Signed)
 Lumbar Facet Joint Nerve Denervation  Patient: Bryan Rodgers      Date of Birth: 1960/04/12 MRN: [id number] PCP: Antonetta Rollene FORBES, MD      Visit Date: 09/26/2023   Universal Protocol:    Date/Time: 08/26/258:44 PM  Consent Given By: the patient  Position: PRONE  Additional Comments: Vital signs were monitored before and after the procedure. Patient was prepped and draped in the usual sterile fashion. The correct patient, procedure, and site was verified.   Injection Procedure Details:   Procedure diagnoses:  1. Spondylosis without myelopathy or radiculopathy, lumbar region      Meds Administered:  Meds ordered this encounter  Medications   methylPREDNISolone  acetate (DEPO-MEDROL ) injection 40 mg     Laterality: Left  Location/Site:  L4-L5, L3 and L4 medial branches and L5-S1, L4 medial branch and L5 dorsal ramus  Needle: 18 ga.,  10mm active tip, RF Cannula  Needle Placement: Along juncture of superior articular process and transverse pocess  Findings:  -Comments:  Procedure Details: For each desired target nerve, the corresponding transverse process (sacral ala for the L5 dorsal rami) was identified and the fluoroscope was positioned to square off the endplates of the corresponding vertebral body to achieve a true AP midline view.  The beam was then obliqued 15 to 20 degrees and caudally tilted 15 to 20 degrees to line up a trajectory along the target nerves. The skin over the target of the junction of superior articulating process and transverse process (sacral ala for the L5 dorsal rami) was infiltrated with 1ml of 1% Lidocaine  without Epinephrine .  The 18 gauge 10mm active tip outer cannula was advanced in trajectory view to the target.  This procedure was repeated for each target nerve.  Then, for all levels, the outer cannula placement was fine-tuned and the position was then confirmed with bi-planar imaging.    Test stimulation was done both at  sensory and motor levels to ensure there was no radicular stimulation. The target tissues were then infiltrated with 1 ml of 1% Lidocaine  without Epinephrine . Subsequently, a percutaneous neurotomy was carried out for 90 seconds at 80 degrees Celsius.  After the completion of the lesion, 1 ml of injectate was delivered. It was then repeated for each facet joint nerve mentioned above. Appropriate radiographs were obtained to verify the probe placement during the neurotomy.   Additional Comments:  The patient tolerated the procedure well Dressing: 2 x 2 sterile gauze and Band-Aid    Post-procedure details: Patient was observed during the procedure. Post-procedure instructions were reviewed.  Patient left the clinic in stable condition.

## 2023-09-27 ENCOUNTER — Telehealth: Payer: Self-pay | Admitting: Orthopaedic Surgery

## 2023-10-03 ENCOUNTER — Telehealth: Payer: Self-pay | Admitting: Orthopaedic Surgery

## 2023-10-04 ENCOUNTER — Ambulatory Visit (INDEPENDENT_AMBULATORY_CARE_PROVIDER_SITE_OTHER)

## 2023-10-04 DIAGNOSIS — Z23 Encounter for immunization: Secondary | ICD-10-CM | POA: Diagnosis not present

## 2023-10-04 NOTE — Progress Notes (Signed)
 Patient is in office today for a nurse visit for Immunization. Patient Injection was given in the  Right deltoid. Patient tolerated injection well.

## 2023-10-09 ENCOUNTER — Telehealth: Payer: Self-pay | Admitting: Orthopaedic Surgery

## 2023-10-11 ENCOUNTER — Ambulatory Visit: Admitting: Orthopaedic Surgery

## 2023-10-11 ENCOUNTER — Encounter: Payer: Self-pay | Admitting: Orthopaedic Surgery

## 2023-10-11 DIAGNOSIS — M545 Low back pain, unspecified: Secondary | ICD-10-CM

## 2023-10-11 DIAGNOSIS — M47816 Spondylosis without myelopathy or radiculopathy, lumbar region: Secondary | ICD-10-CM

## 2023-10-11 DIAGNOSIS — G8929 Other chronic pain: Secondary | ICD-10-CM | POA: Diagnosis not present

## 2023-10-11 NOTE — Progress Notes (Signed)
 My pain is the same.  It is worse at night.  He has chronic lower back pain.  He has seen Dr. Eldonna and the injections have helped some.  He has pain more at night.  It is localized to the lower back. The pain medicine helps him sleep.  He tries to be active.  ROM of the back is good, muscle tone and strength normal, NV intact, normal gait, negative SLR.  Encounter Diagnoses  Name Primary?   Spondylosis without myelopathy or radiculopathy, lumbar region Yes   Chronic left-sided low back pain without sciatica    I have informed the patient I will be retiring from medical practice and from this office on November 02, 2023.  The patient has been offered continuing care with Dr. Margrette or Dr. Onesimo of this office.  The patient may choose another provider and the records will be forwarded after proper signature and notification.  Patient understands and agrees.  I have offered to have him seen by Dr. Georgina at the [facility] office or continue as he has been doing coming here and seeing Dr. Eldonna as needed.  He would prefer to come here.  Return in six weeks.  Call if any problem.  Precautions discussed.  Electronically Signed Lemond Stable, MD 9/10/202510:21 AM

## 2023-10-16 ENCOUNTER — Telehealth: Payer: Self-pay | Admitting: Orthopaedic Surgery

## 2023-10-23 ENCOUNTER — Telehealth: Payer: Self-pay | Admitting: Orthopaedic Surgery

## 2023-10-31 ENCOUNTER — Telehealth: Payer: Self-pay | Admitting: Orthopaedic Surgery

## 2023-11-08 ENCOUNTER — Emergency Department (HOSPITAL_COMMUNITY)

## 2023-11-08 ENCOUNTER — Encounter (HOSPITAL_COMMUNITY): Payer: Self-pay

## 2023-11-08 ENCOUNTER — Emergency Department (HOSPITAL_COMMUNITY)
Admission: EM | Admit: 2023-11-08 | Discharge: 2023-11-08 | Disposition: A | Discharge status: Home / Self Care | Attending: Emergency Medicine | Admitting: Emergency Medicine

## 2023-11-08 ENCOUNTER — Other Ambulatory Visit: Payer: Self-pay

## 2023-11-08 DIAGNOSIS — Z794 Long term (current) use of insulin: Secondary | ICD-10-CM | POA: Insufficient documentation

## 2023-11-08 DIAGNOSIS — R109 Unspecified abdominal pain: Secondary | ICD-10-CM | POA: Insufficient documentation

## 2023-11-08 DIAGNOSIS — K5730 Diverticulosis of large intestine without perforation or abscess without bleeding: Secondary | ICD-10-CM | POA: Diagnosis not present

## 2023-11-08 DIAGNOSIS — R319 Hematuria, unspecified: Secondary | ICD-10-CM | POA: Diagnosis not present

## 2023-11-08 DIAGNOSIS — Z7982 Long term (current) use of aspirin: Secondary | ICD-10-CM | POA: Insufficient documentation

## 2023-11-08 DIAGNOSIS — N400 Enlarged prostate without lower urinary tract symptoms: Secondary | ICD-10-CM | POA: Diagnosis not present

## 2023-11-08 DIAGNOSIS — N200 Calculus of kidney: Secondary | ICD-10-CM | POA: Diagnosis not present

## 2023-11-08 LAB — URINALYSIS, ROUTINE W REFLEX MICROSCOPIC
Bacteria, UA: NONE SEEN
Bilirubin Urine: NEGATIVE
Glucose, UA: NEGATIVE mg/dL
Ketones, ur: NEGATIVE mg/dL
Leukocytes,Ua: NEGATIVE
Nitrite: NEGATIVE
Protein, ur: 30 mg/dL — AB
Specific Gravity, Urine: 1.019 (ref 1.005–1.030)
pH: 5 (ref 5.0–8.0)

## 2023-11-08 LAB — COMPREHENSIVE METABOLIC PANEL WITH GFR
ALT: 18 U/L (ref 0–44)
AST: 25 U/L (ref 15–41)
Albumin: 4.4 g/dL (ref 3.5–5.0)
Alkaline Phosphatase: 123 U/L (ref 38–126)
Anion gap: 10 (ref 5–15)
BUN: 17 mg/dL (ref 8–23)
CO2: 24 mmol/L (ref 22–32)
Calcium: 9.5 mg/dL (ref 8.9–10.3)
Chloride: 106 mmol/L (ref 98–111)
Creatinine, Ser: 1.02 mg/dL (ref 0.61–1.24)
GFR, Estimated: >60 mL/min (ref >60)
Glucose, Bld: 99 mg/dL (ref 70–99)
Potassium: 4.1 mmol/L (ref 3.5–5.1)
Sodium: 140 mmol/L (ref 135–145)
Total Bilirubin: 0.5 mg/dL (ref 0.0–1.2)
Total Protein: 7.1 g/dL (ref 6.5–8.1)

## 2023-11-08 LAB — CBC WITH DIFFERENTIAL/PLATELET
Abs Immature Granulocytes: 0.02 K/uL (ref 0.00–0.07)
Basophils Absolute: 0 K/uL (ref 0.0–0.1)
Basophils Relative: 1 %
Eosinophils Absolute: 0.2 K/uL (ref 0.0–0.5)
Eosinophils Relative: 3 %
HCT: 42 % (ref 39.0–52.0)
Hemoglobin: 13.6 g/dL (ref 13.0–17.0)
Immature Granulocytes: 0 %
Lymphocytes Relative: 28 %
Lymphs Abs: 2 K/uL (ref 0.7–4.0)
MCH: 29.8 pg (ref 26.0–34.0)
MCHC: 32.4 g/dL (ref 30.0–36.0)
MCV: 91.9 fL (ref 80.0–100.0)
Monocytes Absolute: 0.6 K/uL (ref 0.1–1.0)
Monocytes Relative: 9 %
Neutro Abs: 4.3 K/uL (ref 1.7–7.7)
Neutrophils Relative %: 59 %
Platelets: 194 K/uL (ref 150–400)
RBC: 4.57 MIL/uL (ref 4.22–5.81)
RDW: 14.3 % (ref 11.5–15.5)
WBC: 7.1 K/uL (ref 4.0–10.5)
nRBC: 0 % (ref 0.0–0.2)

## 2023-11-08 MED ORDER — TRAMADOL HCL 50 MG PO TABS: ORAL_TABLET | ORAL | 0 refills | Status: DC

## 2023-11-08 MED ORDER — KETOROLAC TROMETHAMINE 30 MG/ML IJ SOLN
30.0000 mg | Freq: Once | INTRAMUSCULAR | Status: AC
  Administered 2023-11-08: 30 mg via INTRAVENOUS
  Filled 2023-11-08: qty 1

## 2023-11-08 MED ORDER — ONDANSETRON HCL 4 MG/2ML IJ SOLN
4.0000 mg | Freq: Once | INTRAMUSCULAR | Status: AC
  Administered 2023-11-08: 4 mg via INTRAVENOUS
  Filled 2023-11-08: qty 2

## 2023-11-08 NOTE — ED Triage Notes (Signed)
 Patient come in for complaint of pain to right flank, stated It feels like I have a grapefruit to this right side and it felt like this the last time I had kidney stones. Pain started yesterday when he was getting up from the couch.

## 2023-11-08 NOTE — Discharge Instructions (Signed)
 Follow-up with [facility] in [place] or [place] next week.

## 2023-11-08 NOTE — Progress Notes (Signed)
 Bryan Rodgers                                          MRN: [id number]   11/08/2023   The [facility] Quality Team Specialist reviewed this patient medical record for the purposes of chart review for care gap closure. The following were reviewed: chart review for care gap closure-kidney health evaluation for diabetes:eGFR  and uACR.    [facility] Quality Team

## 2023-11-08 NOTE — ED Provider Notes (Signed)
 [place] EMERGENCY DEPARTMENT AT [facility] Provider Note   CSN: [id number] Arrival date & time: 11/08/23  0800     Patient presents with: No chief complaint on file.   Bryan Rodgers is a 63 y.o. male.  {Add pertinent medical, surgical, social history, OB history to YEP:[id number]} Patient complains of right flank pain that started in the last day or so.  Patient no longer having significant pain now   Flank Pain       Prior to Admission medications   Medication Sig Start Date End Date Taking? Authorizing Provider  oxyCODONE -acetaminophen  (PERCOCET/ROXICET) 5-325 MG tablet Take 1 tablet by mouth every 6 (six) hours as needed for severe pain (pain score 7-10). 10/31/23   Brenna Lin, MD  traMADol  (ULTRAM ) 50 MG tablet Take 1 every 6 hours for pain that is not relieved by Tylenol  alone 11/08/23  Yes Suzette Pac, MD  acetaminophen  (TYLENOL  8 HOUR) 650 MG CR tablet Take one tablet by mouth  one to two times daily for arthritis pain 05/17/22   Antonetta Rollene BRAVO, MD  albuterol  (VENTOLIN  HFA) 108 (90 Base) MCG/ACT inhaler Inhale 2 puffs into the lungs every 6 (six) hours as needed for wheezing or shortness of breath. 08/11/23   Antonetta Rollene BRAVO, MD  aspirin  EC 81 MG tablet Take 1 tablet (81 mg total) by mouth at bedtime. 09/01/20   Lanis Pupa, MD  blood glucose meter kit and supplies Dispense based on patient and insurance preference. Once daily testing DX E11.9 11/18/20   Antonetta Rollene BRAVO, MD  cyclobenzaprine  (FLEXERIL ) 10 MG tablet Take 1 tablet (10 mg total) by mouth at bedtime. One tablet every night at bedtime as needed for spasm. 05/17/23   Brenna Lin, MD  diazepam  (VALIUM ) 5 MG tablet Take one tablet by mouth with light food one hour prior to procedure. 09/01/23   Williams, Megan E, NP  doxycycline  (VIBRAMYCIN ) 100 MG capsule Take 1 capsule (100 mg total) by mouth 2 (two) times daily. 08/21/23   Patsey Lot, MD  glucose blood test strip Use as  instructed once daily dx e11.9 11/18/20   Antonetta Rollene BRAVO, MD  lamoTRIgine  (LAMICTAL ) 100 MG tablet TAKE ONE TABLET BY MOUTH TWICE DAILY 05/29/23   Antonetta Rollene BRAVO, MD  Lancets 30G MISC Once daily testing dx e11.9 11/18/20   Antonetta Rollene BRAVO, MD  naproxen  (NAPROSYN ) 500 MG tablet TAKE ONE TABLET BY MOUTH TWICE DAILY WITH MEALS 05/29/23   Brenna Lin, MD  olmesartan  (BENICAR ) 20 MG tablet Take 1 tablet (20 mg total) by mouth daily. 08/11/23   Antonetta Rollene BRAVO, MD  omeprazole  (PRILOSEC) 40 MG capsule TAKE ONE CAPSULE BY MOUTH EVERY DAY 11/21/22   Antonetta Rollene BRAVO, MD  OZEMPIC , 0.25 OR 0.5 MG/DOSE, 2 MG/3ML SOPN inject 0.5mg  SUBCUTANEOUSLY WEEKLY 04/26/23   Antonetta Rollene BRAVO, MD  rosuvastatin  (CRESTOR ) 5 MG tablet TAKE 1 TABLET BY MOUTH ON MONDAY, WEDNESDAY, AND FRIDAY AT BEDTIME. 08/11/23   Antonetta Rollene BRAVO, MD  temazepam  (RESTORIL ) 30 MG capsule Take 1 capsule (30 mg total) by mouth at bedtime. 01/19/23   Antonetta Rollene BRAVO, MD  temazepam  (RESTORIL ) 30 MG capsule Take 1 capsule (30 mg total) by mouth at bedtime as needed for sleep. 08/11/23   Antonetta Rollene BRAVO, MD  triamcinolone  (KENALOG ) 0.025 % ointment APPLY TO THE AFFECTED AREA(S) TWICE DAILY AS NEEDED ECZEMA/RASHES 07/04/22   Antonetta Rollene BRAVO, MD    Allergies: Simvastatin     Review of Systems  Genitourinary:  Positive for flank pain.    Updated Vital Signs BP 138/72   Pulse 62   Temp 98.2 F (36.8 C) (Oral)   Resp 17   Ht 5' 10 (1.778 m)   Wt 88.5 kg   SpO2 94%   BMI 27.98 kg/m   Physical Exam  (all labs ordered are listed, but only abnormal results are displayed) Labs Reviewed  URINALYSIS, ROUTINE W REFLEX MICROSCOPIC - Abnormal; Notable for the following components:      Result Value   Hgb urine dipstick MODERATE (*)    Protein, ur 30 (*)    All other components within normal limits  URINE CULTURE  CBC WITH DIFFERENTIAL/PLATELET  COMPREHENSIVE METABOLIC PANEL WITH GFR     EKG: None  Radiology: CT Renal Stone Study Result Date: 11/08/2023 CLINICAL DATA:  Right-sided flank pain.  Nephrolithiasis. EXAM: CT ABDOMEN AND PELVIS WITHOUT CONTRAST TECHNIQUE: Multidetector CT imaging of the abdomen and pelvis was performed following the standard protocol without IV contrast. RADIATION DOSE REDUCTION: This exam was performed according to the departmental dose-optimization program which includes automated exposure control, adjustment of the mA and/or kV according to patient size and/or use of iterative reconstruction technique. COMPARISON:  12/20/2019 FINDINGS: Lower chest: Multiple thin wall cysts in both lung bases appears similar to prior study. No acute findings. Hepatobiliary: No mass visualized on this unenhanced exam. Gallbladder is unremarkable. No evidence of biliary ductal dilatation. Pancreas: No mass or inflammatory process visualized on this unenhanced exam. Spleen:  Within normal limits in size. Adrenals/Urinary tract: No evidence of urolithiasis or hydronephrosis. Unremarkable unopacified urinary bladder. Stomach/Bowel: No evidence of obstruction, inflammatory process, or abnormal fluid collections. Normal appendix visualized. Diffuse colonic diverticulosis again noted, without signs of diverticulitis. Vascular/Lymphatic: No pathologically enlarged lymph nodes identified. No evidence of abdominal aortic aneurysm. Reproductive:  Stable slightly enlarged prostate. Other:  None. Musculoskeletal:  No suspicious bone lesions identified. IMPRESSION: No evidence of urolithiasis, hydronephrosis, or other acute findings. Colonic diverticulosis, without radiographic evidence of diverticulitis. Electronically Signed   By: Norleen DELENA Kil M.D.   On: 11/08/2023 08:57    {Document cardiac monitor, telemetry assessment procedure when appropriate:32947} Procedures   Medications Ordered in the ED  ketorolac  (TORADOL ) 30 MG/ML injection 30 mg (30 mg Intravenous Given 11/08/23 0932)   ondansetron  (ZOFRAN ) injection 4 mg (4 mg Intravenous Given 11/08/23 0934)      {Click here for ABCD2, HEART and other calculators REFRESH Note before signing:1}                              Medical Decision Making Amount and/or Complexity of Data Reviewed Labs: ordered. Radiology: ordered.  Risk Prescription drug management.   Patient with unremarkable CT scan.  Urinalysis shows hematuria.  He is referred to urology and given pain medicine to use as needed  {Document critical care time when appropriate  Document review of labs and clinical decision tools ie CHADS2VASC2, etc  Document your independent review of radiology images and any outside records  Document your discussion with family members, caretakers and with consultants  Document social determinants of health affecting pt's care  Document your decision making why or why not admission, treatments were needed:32947:::1}   Final diagnoses:  Hematuria, unspecified type    ED Discharge Orders          Ordered    traMADol  (ULTRAM ) 50 MG tablet        11/08/23 1148

## 2023-11-09 DIAGNOSIS — E785 Hyperlipidemia, unspecified: Secondary | ICD-10-CM | POA: Diagnosis not present

## 2023-11-09 DIAGNOSIS — E119 Type 2 diabetes mellitus without complications: Secondary | ICD-10-CM | POA: Diagnosis not present

## 2023-11-09 DIAGNOSIS — I10 Essential (primary) hypertension: Secondary | ICD-10-CM | POA: Diagnosis not present

## 2023-11-09 DIAGNOSIS — F17210 Nicotine dependence, cigarettes, uncomplicated: Secondary | ICD-10-CM | POA: Diagnosis not present

## 2023-11-09 DIAGNOSIS — X58XXXA Exposure to other specified factors, initial encounter: Secondary | ICD-10-CM | POA: Diagnosis not present

## 2023-11-09 DIAGNOSIS — M545 Low back pain, unspecified: Secondary | ICD-10-CM | POA: Diagnosis not present

## 2023-11-09 DIAGNOSIS — S39012A Strain of muscle, fascia and tendon of lower back, initial encounter: Secondary | ICD-10-CM | POA: Diagnosis not present

## 2023-11-09 LAB — URINE CULTURE: Culture: NO GROWTH

## 2023-11-19 ENCOUNTER — Other Ambulatory Visit: Payer: Self-pay | Admitting: Family Medicine

## 2023-11-20 NOTE — Progress Notes (Unsigned)
 CC: blood in urine  HPI: 63 yo male presents for E/M of recent episode of gross hematuria.    PMH: Past Medical History:  Diagnosis Date   Alcoholic (HCC) quit 2014   10-15 beers per day on the weekends    Anemia    Arthritis    Asthma    childhood asthma up to age 59   Chest pain    Palpations   Dyspnea    Elevated CPK    GERD (gastroesophageal reflux disease)    Hallucination    Headache    Hemorrhoids    with bleeding    History of colonic polyps    removed via colonscope   Hypertension    Kidney stone    Migraine    Neuromuscular disorder (HCC)    Overweight (BMI 25.0-29.9)    Pneumonia    Prediabetes 10/2012   HBA1C is 6.4   Tobacco abuse     Surgical History: Past Surgical History:  Procedure Laterality Date   BIOPSY  01/31/2023   Procedure: BIOPSY;  Surgeon: Cindie Carlin POUR, DO;  [facility];  Service: Endoscopy;;   CATARACT EXTRACTION W/ INTRAOCULAR LENS IMPLANT Bilateral 2012   COLONOSCOPY  2007   Dr. Harvey: internal hemorrhoids, otherwise normal   COLONOSCOPY  2004   reported history of colon polyp   COLONOSCOPY WITH PROPOFOL  N/A 07/26/2016   Procedure: COLONOSCOPY WITH PROPOFOL ;  Surgeon: Harvey Margo CROME, MD;  [facility];  Service: Endoscopy;  Laterality: N/A;  830    COLONOSCOPY WITH PROPOFOL  N/A 01/31/2023   Procedure: COLONOSCOPY WITH PROPOFOL ;  Surgeon: Cindie Carlin POUR, DO;  [facility];  Service: Endoscopy;  Laterality: N/A;  11:00am, asa 2   ESOPHAGOGASTRODUODENOSCOPY (EGD) WITH PROPOFOL  N/A 01/31/2023   Procedure: ESOPHAGOGASTRODUODENOSCOPY (EGD) WITH PROPOFOL ;  Surgeon: Cindie Carlin POUR, DO;  [facility];  Service: Endoscopy;  Laterality: N/A;   hemmorrhoidectomy  2002   LUMBAR LAMINECTOMY/DECOMPRESSION MICRODISCECTOMY N/A 08/27/2020   Procedure: EXTRAFORAMINAL DECOMPRESSION RIGHT LUMBAR FIVE-SACRAL ONE, RESECT SYNOVIAL CYST;  Surgeon: Lanis Pupa, MD;  [facility];   Service: Neurosurgery;  Laterality: N/A;   POLYPECTOMY  07/26/2016   Procedure: POLYPECTOMY;  Surgeon: Harvey Margo CROME, MD;  [facility];  Service: Endoscopy;;  splenic flexure polyps x4   POLYPECTOMY  01/31/2023   Procedure: POLYPECTOMY INTESTINAL;  Surgeon: Cindie Carlin POUR, DO;  [facility];  Service: Endoscopy;;    Home Medications:  Allergies as of 11/21/2023       Reactions   Simvastatin  Other (See Comments)   Visual hallucination        Medication List        Accurate as of November 20, 2023  6:34 AM. If you have any questions, ask your nurse or doctor.          acetaminophen  650 MG CR tablet Commonly known as: Tylenol  8 Hour Take one tablet by mouth  one to two times daily for arthritis pain   albuterol  108 (90 Base) MCG/ACT inhaler Commonly known as: Ventolin  HFA Inhale 2 puffs into the lungs every 6 (six) hours as needed for wheezing or shortness of breath.   aspirin  EC 81 MG tablet Take 1 tablet (81 mg total) by mouth at bedtime.   blood glucose meter kit and supplies Dispense based on patient and insurance preference. Once daily testing DX E11.9   cyclobenzaprine  10 MG tablet Commonly known as: FLEXERIL  Take 1 tablet (  10 mg total) by mouth at bedtime. One tablet every night at bedtime as needed for spasm.   diazepam  5 MG tablet Commonly known as: VALIUM  Take one tablet by mouth with light food one hour prior to procedure.   doxycycline  100 MG capsule Commonly known as: VIBRAMYCIN  Take 1 capsule (100 mg total) by mouth 2 (two) times daily.   glucose blood test strip Use as instructed once daily dx e11.9   lamoTRIgine  100 MG tablet Commonly known as: LAMICTAL  TAKE ONE TABLET BY MOUTH TWICE DAILY   Lancets 30G Misc Once daily testing dx e11.9   naproxen  500 MG tablet Commonly known as: NAPROSYN  TAKE ONE TABLET BY MOUTH TWICE DAILY WITH MEALS   olmesartan  20 MG tablet Commonly known as: BENICAR  Take 1 tablet (20 mg  total) by mouth daily.   omeprazole  40 MG capsule Commonly known as: PRILOSEC TAKE ONE CAPSULE BY MOUTH EVERY DAY   oxyCODONE -acetaminophen  5-325 MG tablet Commonly known as: PERCOCET/ROXICET Take 1 tablet by mouth every 6 (six) hours as needed for severe pain (pain score 7-10).   Ozempic  (0.25 or 0.5 MG/DOSE) 2 MG/3ML Sopn Generic drug: Semaglutide (0.25 or 0.5MG /DOS) inject 0.5mg  SUBCUTANEOUSLY WEEKLY   rosuvastatin  5 MG tablet Commonly known as: CRESTOR  TAKE 1 TABLET BY MOUTH ON MONDAY, WEDNESDAY, AND FRIDAY AT BEDTIME.   temazepam  30 MG capsule Commonly known as: RESTORIL  Take 1 capsule (30 mg total) by mouth at bedtime.   temazepam  30 MG capsule Commonly known as: RESTORIL  Take 1 capsule (30 mg total) by mouth at bedtime as needed for sleep.   traMADol  50 MG tablet Commonly known as: ULTRAM  Take 1 every 6 hours for pain that is not relieved by Tylenol  alone   triamcinolone  0.025 % ointment Commonly known as: KENALOG  APPLY TO THE AFFECTED AREA(S) TWICE DAILY AS NEEDED ECZEMA/RASHES        Allergies:  Allergies  Allergen Reactions   Simvastatin  Other (See Comments)    Visual hallucination    Family History: Family History  Problem Relation Age of Onset   Breast cancer Mother    Cancer Father        unsure of origin - spread to bones   Kidney disease Brother    Diabetes Sister    Breast cancer Sister    Colon cancer Neg Hx     Social History:  reports that he quit smoking about 11 years ago. His smoking use included cigarettes. He has never used smokeless tobacco. He reports that he does not drink alcohol and does not use drugs.  ROS: All other review of systems were reviewed and are negative except what is noted above in HPI  Physical Exam: There were no vitals taken for this visit.  Constitutional:  Alert and oriented, No acute distress. HEENT: [place] AT, moist mucus membranes.  Trachea midline, no masses. Cardiovascular: No clubbing, cyanosis, or  edema. Respiratory: Normal respiratory effort, no increased work of breathing. GI: No inguinal hernias GU: Normal phallus. No masses/lesions on penis, testis, scrotum. Prostate ***g smooth no nodules no induration.  Lymph: No cervical or inguinal lymphadenopathy. Skin: No rashes, bruises or suspicious lesions. Neurologic: Grossly intact, no focal deficits, moving all 4 extremities. Psychiatric: Normal mood and affect.  Laboratory Data: Lab Results  Component Value Date   WBC 7.1 11/08/2023   HGB 13.6 11/08/2023   HCT 42.0 11/08/2023   MCV 91.9 11/08/2023   PLT 194 11/08/2023    Lab Results  Component Value Date   CREATININE 1.02  11/08/2023    Lab Results  Component Value Date   PSA 6.61 (H) 03/10/2011   PSA 1.12 03/04/2009   PSA 0.87 04/26/2008    Lab Results  Component Value Date   TESTOSTERONE  582.52 07/08/2010    Lab Results  Component Value Date   HGBA1C 6.1 (H) 07/19/2023    Urinalysis   Pertinent Imaging: CT A/P images reviewed  Assessment:    Plan:    There are no diagnoses linked to this encounter.  No follow-ups on file.  Garnette CHRISTELLA Shack, MD  [facility] 

## 2023-11-21 ENCOUNTER — Encounter: Payer: Self-pay | Admitting: Urology

## 2023-11-21 ENCOUNTER — Ambulatory Visit (INDEPENDENT_AMBULATORY_CARE_PROVIDER_SITE_OTHER): Admitting: Urology

## 2023-11-21 VITALS — BP 144/69 | HR 79

## 2023-11-21 DIAGNOSIS — Z87438 Personal history of other diseases of male genital organs: Secondary | ICD-10-CM | POA: Diagnosis not present

## 2023-11-21 DIAGNOSIS — F17210 Nicotine dependence, cigarettes, uncomplicated: Secondary | ICD-10-CM

## 2023-11-21 DIAGNOSIS — R972 Elevated prostate specific antigen [PSA]: Secondary | ICD-10-CM

## 2023-11-21 DIAGNOSIS — N029 Recurrent and persistent hematuria with unspecified morphologic changes: Secondary | ICD-10-CM | POA: Diagnosis not present

## 2023-11-21 DIAGNOSIS — N400 Enlarged prostate without lower urinary tract symptoms: Secondary | ICD-10-CM

## 2023-11-21 DIAGNOSIS — R310 Gross hematuria: Secondary | ICD-10-CM

## 2023-11-22 ENCOUNTER — Other Ambulatory Visit: Payer: Self-pay | Admitting: Family Medicine

## 2023-11-22 ENCOUNTER — Encounter: Payer: Self-pay | Admitting: Orthopedic Surgery

## 2023-11-22 ENCOUNTER — Ambulatory Visit: Admitting: Orthopedic Surgery

## 2023-11-22 DIAGNOSIS — G8928 Other chronic postprocedural pain: Secondary | ICD-10-CM

## 2023-11-22 DIAGNOSIS — G8929 Other chronic pain: Secondary | ICD-10-CM

## 2023-11-22 DIAGNOSIS — M545 Low back pain, unspecified: Secondary | ICD-10-CM

## 2023-11-22 DIAGNOSIS — M47816 Spondylosis without myelopathy or radiculopathy, lumbar region: Secondary | ICD-10-CM

## 2023-11-22 LAB — MICROSCOPIC EXAMINATION
Bacteria, UA: NONE SEEN
RBC, Urine: >30 /HPF — AB (ref 0–2)

## 2023-11-22 LAB — URINALYSIS, ROUTINE W REFLEX MICROSCOPIC
Bilirubin, UA: NEGATIVE
Glucose, UA: NEGATIVE
Leukocytes,UA: NEGATIVE
Nitrite, UA: NEGATIVE
Specific Gravity, UA: 1.025 (ref 1.005–1.030)
Urobilinogen, Ur: 1 mg/dL (ref 0.2–1.0)
pH, UA: 6 (ref 5.0–7.5)

## 2023-11-22 LAB — PSA: Prostate Specific Ag, Serum: 1.5 ng/mL (ref 0.0–4.0)

## 2023-11-22 MED ORDER — OXYCODONE-ACETAMINOPHEN 5-325 MG PO TABS
1.0000 | ORAL_TABLET | Freq: Four times a day (QID) | ORAL | 0 refills | Status: DC | PRN
Start: 2023-11-22 — End: 2023-11-28

## 2023-11-22 NOTE — Patient Instructions (Addendum)
 Instructions regarding chronic pain management   You have chronic pain.  You are on chronic opioid therapy.  You will be referred to a chronic pain chronic opioid therapy specialist.  You have 30 days from today to continue getting your medications from this office.  After 30 days you would no longer get prescriptions for opioids from [facility].   [facility] [address]  [phone]  We will send referral there, you call next week to schedule, they will call you too.

## 2023-11-22 NOTE — Progress Notes (Signed)
   Chief Complaint  Patient presents with   Back Pain  Patient reports back pain unchanged controlled with oxycodone   Encounter Diagnoses  Name Primary?   Chronic left-sided low back pain without sciatica Yes   Spondylosis without myelopathy or radiculopathy, lumbar region    Facet arthropathy, lumbar    Lumbar pain    Other chronic postprocedural pain     Chart reviewed including P DMP  Patient of Dr. Janae I am taking over care   Dr. Brenna has been managing the patient's chronic lower back pain  He had back pain going back to 2012 he had surgery in 2021 in [place] to remove a cyst from the L5-S1 region it was causing right leg radicular symptoms he had an extraforaminal decompression and synovial cyst excision by Dr. Lanis  He gets oxycodone  for chronic pain management from Dr. Brenna who has retired  Exam findings show a scar in the midline of the lower spine in the region of surgery He has tenderness in that area on both sides However his sensation is normal in both lower extremities and he has grade 5 strength in both lower extremities  Encounter Diagnoses  Name Primary?   Chronic left-sided low back pain without sciatica Yes   Spondylosis without myelopathy or radiculopathy, lumbar region    Facet arthropathy, lumbar    Lumbar pain    Other chronic postprocedural pain      We will make the appropriate referral for chronic pain management  The patient was advised he can call here for his refills for the next 30 days  Meds ordered this encounter  Medications   oxyCODONE -acetaminophen  (PERCOCET/ROXICET) 5-325 MG tablet    Sig: Take 1 tablet by mouth every 6 (six) hours as needed for severe pain (pain score 7-10).    Dispense:  28 tablet    Refill:  0

## 2023-11-22 NOTE — Progress Notes (Signed)
    11/22/2023   Chief Complaint  Patient presents with   Back Pain    Encounter Diagnoses  Name Primary?   Chronic left-sided low back pain without sciatica Yes   Spondylosis without myelopathy or radiculopathy, lumbar region    Facet arthropathy, lumbar    Lumbar pain     What pharmacy do you use ? [facility]____  DOI/DOS/ Date:    Unchanged

## 2023-11-24 ENCOUNTER — Telehealth: Payer: Self-pay

## 2023-11-24 NOTE — Telephone Encounter (Signed)
 Naproxen  500 MG  Qty 60 Tablets Take one tablet by mouth twice daily with meals  PATIENT USES [facility]

## 2023-11-27 ENCOUNTER — Other Ambulatory Visit: Payer: Self-pay | Admitting: Orthopedic Surgery

## 2023-11-27 MED ORDER — NAPROXEN 500 MG PO TABS: 500.0000 mg | ORAL_TABLET | Freq: Two times a day (BID) | ORAL | 5 refills | Status: AC

## 2023-11-28 ENCOUNTER — Other Ambulatory Visit: Payer: Self-pay | Admitting: Orthopedic Surgery

## 2023-11-28 DIAGNOSIS — M545 Low back pain, unspecified: Secondary | ICD-10-CM

## 2023-11-28 MED ORDER — OXYCODONE-ACETAMINOPHEN 5-325 MG PO TABS
1.0000 | ORAL_TABLET | Freq: Four times a day (QID) | ORAL | 0 refills | Status: DC | PRN
Start: 2023-11-28 — End: 2023-12-04

## 2023-11-28 NOTE — Progress Notes (Signed)
 2/4 rx 2 more left

## 2023-12-04 ENCOUNTER — Other Ambulatory Visit: Payer: Self-pay | Admitting: Orthopedic Surgery

## 2023-12-04 ENCOUNTER — Encounter: Payer: Self-pay | Admitting: Radiology

## 2023-12-04 DIAGNOSIS — M545 Low back pain, unspecified: Secondary | ICD-10-CM

## 2023-12-04 DIAGNOSIS — G8929 Other chronic pain: Secondary | ICD-10-CM

## 2023-12-04 MED ORDER — OXYCODONE-ACETAMINOPHEN 5-325 MG PO TABS: 1.0000 | ORAL_TABLET | Freq: Four times a day (QID) | ORAL | 0 refills | Status: DC | PRN

## 2023-12-04 NOTE — Progress Notes (Signed)
 Instructions regarding chronic pain management   You have chronic pain.  You are on chronic opioid therapy.  You will be referred to a chronic pain chronic opioid therapy specialist.  You have 30 days from today to continue getting your medications from this office.  After 30 days you would no longer get prescriptions for opioids from [facility].

## 2023-12-10 NOTE — Progress Notes (Deleted)
 Assessment:  - History of elevated PSA, last checked in 2013.  Benign exam today  - Persistent microscopic hematuria in a smoker  Plan:   -I counseled patient to stop smoking  - PSA is checked today  I will bring him back for cystoscopy.   HPI: 10.21.2025; 63 yo male presents for E/M of persistent microscopic hematuria.  History dates back to presentation to the emergency room at [facility] in October 2024 for back pain.  Felt to be of musculoskeletal etiology.  He had CT scan at that time which revealed normal-appearing kidneys.  Developed similar symptoms recently--right flank pain--and presented to the emergency room at [facility].  He had CT scan which again revealed no urologic abnormalities.  Noted to have microscopic hematuria at that time as well.  No longstanding urologic history.  He has had no recent PSA screening.  Last PSA done in 2013 was 6.64.  No family history of prostate cancer.  No significant lower urinary tract symptoms.  PSA 1.5  11.11.2025: Here for cysto. PMH: Past Medical History:  Diagnosis Date   Alcoholic (HCC) quit 2014   10-15 beers per day on the weekends    Anemia    Arthritis    Asthma    childhood asthma up to age 85   Chest pain    Palpations   Dyspnea    Elevated CPK    GERD (gastroesophageal reflux disease)    Hallucination    Headache    Hemorrhoids    with bleeding    History of colonic polyps    removed via colonscope   Hypertension    Kidney stone    Migraine    Neuromuscular disorder (HCC)    Overweight (BMI 25.0-29.9)    Pneumonia    Prediabetes 10/2012   HBA1C is 6.4   Tobacco abuse     Surgical History: Past Surgical History:  Procedure Laterality Date   BIOPSY  01/31/2023   Procedure: BIOPSY;  Surgeon: Cindie Carlin POUR, DO;  [facility];  Service: Endoscopy;;   CATARACT EXTRACTION W/ INTRAOCULAR LENS IMPLANT Bilateral 2012   COLONOSCOPY  2007   Dr. Harvey: internal hemorrhoids,  otherwise normal   COLONOSCOPY  2004   reported history of colon polyp   COLONOSCOPY WITH PROPOFOL  N/A 07/26/2016   Procedure: COLONOSCOPY WITH PROPOFOL ;  Surgeon: Harvey Margo CROME, MD;  [facility];  Service: Endoscopy;  Laterality: N/A;  830    COLONOSCOPY WITH PROPOFOL  N/A 01/31/2023   Procedure: COLONOSCOPY WITH PROPOFOL ;  Surgeon: Cindie Carlin POUR, DO;  [facility];  Service: Endoscopy;  Laterality: N/A;  11:00am, asa 2   ESOPHAGOGASTRODUODENOSCOPY (EGD) WITH PROPOFOL  N/A 01/31/2023   Procedure: ESOPHAGOGASTRODUODENOSCOPY (EGD) WITH PROPOFOL ;  Surgeon: Cindie Carlin POUR, DO;  [facility];  Service: Endoscopy;  Laterality: N/A;   hemmorrhoidectomy  2002   LUMBAR LAMINECTOMY/DECOMPRESSION MICRODISCECTOMY N/A 08/27/2020   Procedure: EXTRAFORAMINAL DECOMPRESSION RIGHT LUMBAR FIVE-SACRAL ONE, RESECT SYNOVIAL CYST;  Surgeon: Lanis Pupa, MD;  [facility];  Service: Neurosurgery;  Laterality: N/A;   POLYPECTOMY  07/26/2016   Procedure: POLYPECTOMY;  Surgeon: Harvey Margo CROME, MD;  [facility];  Service: Endoscopy;;  splenic flexure polyps x4   POLYPECTOMY  01/31/2023   Procedure: POLYPECTOMY INTESTINAL;  Surgeon: Cindie Carlin POUR, DO;  [facility];  Service: Endoscopy;;    Home Medications:  Allergies as of 12/12/2023       Reactions  Simvastatin  Other (See Comments)   Visual hallucination        Medication List        Accurate as of December 10, 2023  8:54 AM. If you have any questions, ask your nurse or doctor.          acetaminophen  650 MG CR tablet Commonly known as: Tylenol  8 Hour Take one tablet by mouth  one to two times daily for arthritis pain   albuterol  108 (90 Base) MCG/ACT inhaler Commonly known as: Ventolin  HFA Inhale 2 puffs into the lungs every 6 (six) hours as needed for wheezing or shortness of breath.   aspirin  EC 81 MG tablet Take 1 tablet (81 mg total) by mouth at bedtime.   blood  glucose meter kit and supplies Dispense based on patient and insurance preference. Once daily testing DX E11.9   cyclobenzaprine  10 MG tablet Commonly known as: FLEXERIL  Take 1 tablet (10 mg total) by mouth at bedtime. One tablet every night at bedtime as needed for spasm.   diazepam  5 MG tablet Commonly known as: VALIUM  Take one tablet by mouth with light food one hour prior to procedure.   doxycycline  100 MG capsule Commonly known as: VIBRAMYCIN  Take 1 capsule (100 mg total) by mouth 2 (two) times daily.   glucose blood test strip Use as instructed once daily dx e11.9   lamoTRIgine  100 MG tablet Commonly known as: LAMICTAL  TAKE ONE TABLET BY MOUTH TWICE DAILY   Lancets 30G Misc Once daily testing dx e11.9   methocarbamol  500 MG tablet Commonly known as: ROBAXIN  Take 1,000 mg by mouth 4 (four) times daily.   naproxen  500 MG tablet Commonly known as: NAPROSYN  Take 1 tablet (500 mg total) by mouth 2 (two) times daily with a meal.   olmesartan  20 MG tablet Commonly known as: BENICAR  Take 1 tablet (20 mg total) by mouth daily.   omeprazole  40 MG capsule Commonly known as: PRILOSEC TAKE ONE CAPSULE BY MOUTH EVERY DAY   oxyCODONE -acetaminophen  5-325 MG tablet Commonly known as: PERCOCET/ROXICET Take 1 tablet by mouth every 6 (six) hours as needed for severe pain (pain score 7-10).   Ozempic  (0.25 or 0.5 MG/DOSE) 2 MG/3ML Sopn Generic drug: Semaglutide (0.25 or 0.5MG /DOS) inject 0.5mg  SUBCUTANEOUSLY WEEKLY   rosuvastatin  5 MG tablet Commonly known as: CRESTOR  TAKE 1 TABLET BY MOUTH ON MONDAY, WEDNESDAY, AND FRIDAY AT BEDTIME.   temazepam  30 MG capsule Commonly known as: RESTORIL  Take 1 capsule (30 mg total) by mouth at bedtime.   temazepam  30 MG capsule Commonly known as: RESTORIL  Take 1 capsule (30 mg total) by mouth at bedtime as needed for sleep.   triamcinolone  0.025 % ointment Commonly known as: KENALOG  APPLY TO THE AFFECTED AREA(S) TWICE DAILY AS NEEDED  ECZEMA/RASHES        Allergies:  Allergies  Allergen Reactions   Simvastatin  Other (See Comments)    Visual hallucination    Family History: Family History  Problem Relation Age of Onset   Breast cancer Mother    Cancer Father        unsure of origin - spread to bones   Kidney disease Brother    Diabetes Sister    Breast cancer Sister    Colon cancer Neg Hx     Social History:  reports that he quit smoking about 11 years ago. His smoking use included cigarettes. He has never used smokeless tobacco. He reports that he does not drink alcohol and does not use drugs.  ROS:  All other review of systems were reviewed and are negative except what is noted above in HPI  Physical Exam: There were no vitals taken for this visit.  Constitutional:  Alert and oriented, No acute distress. HEENT: [place] AT, moist mucus membranes.  Trachea midline, no masses. Cardiovascular: No clubbing, cyanosis, or edema. Respiratory: Normal respiratory effort, no increased work of breathing. GI: No inguinal hernias GU: Normal phallus. No masses/lesions on penis, testis, scrotum. Prostate 30 g smooth no nodules no induration.  Increased anal sphincter tone. Lymph: No cervical or inguinal lymphadenopathy. Skin: No rashes, bruises or suspicious lesions. Neurologic: Grossly intact, no focal deficits, moving all 4 extremities. Psychiatric: Normal mood and affect.  Laboratory Data: Lab Results  Component Value Date   WBC 7.1 11/08/2023   HGB 13.6 11/08/2023   HCT 42.0 11/08/2023   MCV 91.9 11/08/2023   PLT 194 11/08/2023    Lab Results  Component Value Date   CREATININE 1.02 11/08/2023    Lab Results  Component Value Date   PSA 6.61 (H) 03/10/2011   PSA 1.12 03/04/2009   PSA 0.87 04/26/2008    Lab Results  Component Value Date   TESTOSTERONE  582.52 07/08/2010    Lab Results  Component Value Date   HGBA1C 6.1 (H) 07/19/2023   Cystoscopy Procedure Note:  Indication: ***  After  informed consent and discussion of the procedure and its risks, BILLAL ROLLO was positioned and prepped in the standard fashion.  Cystoscopy was performed with a flexible cystoscope.   Findings: Urethra:*** Prostate:*** Bladder neck:*** Ureteral orifices:*** Bladder:***  The patient tolerated the procedure well.     Urinalysis Persistent microscopic hematuria  Pertinent Imaging: CT A/P images reviewed--no renal abnormalities.  CT reading from 2024 reviewed  Emergency room records reviewed

## 2023-12-12 ENCOUNTER — Other Ambulatory Visit: Admitting: Urology

## 2023-12-12 DIAGNOSIS — R310 Gross hematuria: Secondary | ICD-10-CM

## 2023-12-12 DIAGNOSIS — R972 Elevated prostate specific antigen [PSA]: Secondary | ICD-10-CM

## 2023-12-12 DIAGNOSIS — N400 Enlarged prostate without lower urinary tract symptoms: Secondary | ICD-10-CM

## 2023-12-13 ENCOUNTER — Other Ambulatory Visit: Payer: Self-pay | Admitting: Orthopedic Surgery

## 2023-12-13 DIAGNOSIS — M545 Low back pain, unspecified: Secondary | ICD-10-CM

## 2023-12-13 DIAGNOSIS — G8929 Other chronic pain: Secondary | ICD-10-CM

## 2023-12-14 ENCOUNTER — Ambulatory Visit: Payer: Self-pay | Admitting: Family Medicine

## 2023-12-14 ENCOUNTER — Encounter: Payer: Self-pay | Admitting: Family Medicine

## 2023-12-14 VITALS — BP 161/90 | HR 78 | Resp 16 | Ht 70.0 in | Wt 186.1 lb

## 2023-12-14 DIAGNOSIS — I10 Essential (primary) hypertension: Secondary | ICD-10-CM | POA: Diagnosis not present

## 2023-12-14 DIAGNOSIS — Z23 Encounter for immunization: Secondary | ICD-10-CM | POA: Diagnosis not present

## 2023-12-14 DIAGNOSIS — E785 Hyperlipidemia, unspecified: Secondary | ICD-10-CM

## 2023-12-14 DIAGNOSIS — E1159 Type 2 diabetes mellitus with other circulatory complications: Secondary | ICD-10-CM

## 2023-12-14 DIAGNOSIS — E1169 Type 2 diabetes mellitus with other specified complication: Secondary | ICD-10-CM

## 2023-12-14 DIAGNOSIS — Z0001 Encounter for general adult medical examination with abnormal findings: Secondary | ICD-10-CM

## 2023-12-14 MED ORDER — OZEMPIC (0.25 OR 0.5 MG/DOSE) 2 MG/3ML ~~LOC~~ SOPN: 0.2500 mg | PEN_INJECTOR | SUBCUTANEOUS | 3 refills | Status: AC

## 2023-12-14 MED ORDER — CYCLOBENZAPRINE HCL 10 MG PO TABS: 10.0000 mg | ORAL_TABLET | Freq: Every day | ORAL | 1 refills | Status: DC

## 2023-12-14 MED ORDER — OLMESARTAN MEDOXOMIL 40 MG PO TABS: 40.0000 mg | ORAL_TABLET | Freq: Every day | ORAL | 5 refills | Status: AC

## 2023-12-14 NOTE — Assessment & Plan Note (Signed)

## 2023-12-14 NOTE — Assessment & Plan Note (Signed)
 Diabetes associated with hypertension, hyperlipidemia, and arthritis  Bryan Rodgers is reminded of the importance of commitment to daily physical activity for 30 minutes or more, as able and the need to limit carbohydrate intake to 30 to 60 grams per meal to help with blood sugar control.   The need to take medication as prescribed, test blood sugar as directed, and to call between visits if there is a concern that blood sugar is uncontrolled is also discussed.   Bryan Rodgers is reminded of the importance of daily foot exam, annual eye examination, and good blood sugar, blood pressure and cholesterol control.     Latest Ref Rng & Units 11/08/2023    8:59 AM 07/19/2023    8:38 AM 12/08/2022    3:00 PM 05/17/2022    1:48 PM 12/03/2021    9:55 AM  Diabetic Labs  HbA1c 4.8 - 5.6 %  6.1  6.1  6.0  6.2   Micro/Creat Ratio 0 - 29 mg/g creat   50     Chol 100 - 199 mg/dL  94  94  95  86   HDL >60 mg/dL  43  51  38  40   Calc LDL 0 - 99 mg/dL  36  30  39  30   Triglycerides 0 - 149 mg/dL  67  52  93  79   Creatinine 0.61 - 1.24 mg/dL 8.97  8.96  9.04  8.82  1.14       12/14/2023    8:47 AM 11/21/2023    2:54 PM 11/08/2023   12:30 PM 11/08/2023   10:30 AM 11/08/2023    9:45 AM 11/08/2023    9:30 AM 11/08/2023    8:45 AM  BP/Weight  Systolic BP 161 144 148 138 144 151 145  Diastolic BP 90 69 75 72 77 84 72  Wt. (Lbs) 186.08        BMI 26.7 kg/m2            12/14/2023    8:40 AM 01/13/2021   10:00 AM  Foot/eye exam completion dates  Foot Form Completion Done Done

## 2023-12-14 NOTE — Progress Notes (Signed)
 Bryan Rodgers     MRN: [id number]      DOB: 1960/08/09  Chief Complaint  Patient presents with   Annual Exam    Cpe. Pt requesting refill of cyclobenzaprine      HPI: Patient is in for annual physical exam. Uncontrolled hypertension is addressed. Recent labs, if available are reviewed. Immunization is reviewed , and  updated     PE; BP (!) 161/90   Pulse 78   Resp 16   Ht 5' 10 (1.778 m)   Wt 186 lb 1.3 oz (84.4 kg)   SpO2 97%   BMI 26.70 kg/m   Pleasant male, alert and oriented x 3, in no cardio-pulmonary distress. Afebrile. HEENT No facial trauma or asymetry. Sinuses non tender. EOMI External ears normal,  Neck: supple, no adenopathy,JVD or thyromegaly.No bruits.  Chest: Clear to ascultation bilaterally.No crackles or wheezes. Non tender to palpation  Cardiovascular system; Heart sounds normal,  S1 and  S2 ,no S3.  No murmur, or thrill. Apical beat not displaced Peripheral pulses normal.  Abdomen: Soft, non tender, no organomegaly or masses. No bruits. Bowel sounds normal. No guarding, tenderness or rebound.    Musculoskeletal exam: Full ROM of spine, hips , shoulders and knees. No deformity ,swelling or crepitus noted. No muscle wasting or atrophy.   Neurologic: Cranial nerves 2 to 12 intact. Power, tone ,sensation and reflexes normal throughout. No disturbance in gait. No tremor.  Skin: Intact, no ulceration, erythema , scaling or rash noted. Pigmentation normal throughout  Psych; Normal mood and affect. Judgement and concentration normal   Assessment & Plan:  Annual visit for general adult medical examination with abnormal findings Annual exam as documented. Counseling done  re healthy lifestyle involving commitment to 150 minutes exercise per week, heart healthy diet, and attaining healthy weight.The importance of adequate sleep also discussed. Regular seat belt use and home safety, is also discussed. Changes in health habits are  decided on by the patient with goals and time frames  set for achieving them. Immunization and cancer screening needs are specifically addressed at this visit.   HTN (hypertension) Uncontrolled, inc olmesartan  to 40 mg daily DASH diet and commitment to daily physical activity for a minimum of 30 minutes discussed and encouraged, as a part of hypertension management. The importance of attaining a healthy weight is also discussed.     12/14/2023    8:47 AM 11/21/2023    2:54 PM 11/08/2023   12:30 PM 11/08/2023   10:30 AM 11/08/2023    9:45 AM 11/08/2023    9:30 AM 11/08/2023    8:45 AM  BP/Weight  Systolic BP 161 144 148 138 144 151 145  Diastolic BP 90 69 75 72 77 84 72  Wt. (Lbs) 186.08        BMI 26.7 kg/m2           NV in 3 weeks  Need for hepatitis B vaccination After obtaining informed consent, the vaccine is  administered , with no adverse effect noted at the time of administration.   Type 2 diabetes mellitus with vascular disease (HCC) Diabetes associated with hypertension, hyperlipidemia, and arthritis  Mr. Trow is reminded of the importance of commitment to daily physical activity for 30 minutes or more, as able and the need to limit carbohydrate intake to 30 to 60 grams per meal to help with blood sugar control.   The need to take medication as prescribed, test blood sugar as directed, and to call between visits  if there is a concern that blood sugar is uncontrolled is also discussed.   Mr. Ratledge is reminded of the importance of daily foot exam, annual eye examination, and good blood sugar, blood pressure and cholesterol control.     Latest Ref Rng & Units 11/08/2023    8:59 AM 07/19/2023    8:38 AM 12/08/2022    3:00 PM 05/17/2022    1:48 PM 12/03/2021    9:55 AM  Diabetic Labs  HbA1c 4.8 - 5.6 %  6.1  6.1  6.0  6.2   Micro/Creat Ratio 0 - 29 mg/g creat   50     Chol 100 - 199 mg/dL  94  94  95  86   HDL >60 mg/dL  43  51  38  40   Calc LDL 0 - 99 mg/dL  36   30  39  30   Triglycerides 0 - 149 mg/dL  67  52  93  79   Creatinine 0.61 - 1.24 mg/dL 8.97  8.96  9.04  8.82  1.14       12/14/2023    8:47 AM 11/21/2023    2:54 PM 11/08/2023   12:30 PM 11/08/2023   10:30 AM 11/08/2023    9:45 AM 11/08/2023    9:30 AM 11/08/2023    8:45 AM  BP/Weight  Systolic BP 161 144 148 138 144 151 145  Diastolic BP 90 69 75 72 77 84 72  Wt. (Lbs) 186.08        BMI 26.7 kg/m2            12/14/2023    8:40 AM 01/13/2021   10:00 AM  Foot/eye exam completion dates  Foot Form Completion Done Done

## 2023-12-14 NOTE — Patient Instructions (Addendum)
 F/U in 8 weeks  Hep B #2 at visit  Hep B #1 today Nurse visit in 3 weeks , re eval uate blood pressure  Fasting lipid, cmp and EGFR, HBA1C, TSH  1 week before next appt with Doc in 8 weeks  Urine ACR today  NEW HIGHER DOSE OLMESARTAN  for blood pressure as this is high, start olmesartan  40 mg one daily , new dose sent to pharmacy, stop olmesartan  20 mg tablet  It is important that you exercise regularly at least 30 minutes 5 times a week. If you develop chest pain, have severe difficulty breathing, or feel very tired, stop exercising immediately and seek medical attention   Thanks for choosing [facility], we consider it a privelige to serve you.

## 2023-12-14 NOTE — Assessment & Plan Note (Signed)
 Uncontrolled, inc olmesartan  to 40 mg daily DASH diet and commitment to daily physical activity for a minimum of 30 minutes discussed and encouraged, as a part of hypertension management. The importance of attaining a healthy weight is also discussed.     12/14/2023    8:47 AM 11/21/2023    2:54 PM 11/08/2023   12:30 PM 11/08/2023   10:30 AM 11/08/2023    9:45 AM 11/08/2023    9:30 AM 11/08/2023    8:45 AM  BP/Weight  Systolic BP 161 144 148 138 144 151 145  Diastolic BP 90 69 75 72 77 84 72  Wt. (Lbs) 186.08        BMI 26.7 kg/m2           NV in 3 weeks

## 2023-12-14 NOTE — Assessment & Plan Note (Signed)
 After obtaining informed consent, the vaccine is  administered , with no adverse effect noted at the time of administration.

## 2023-12-16 LAB — MICROALBUMIN / CREATININE URINE RATIO
Creatinine, Urine: 115.9 mg/dL
Microalb/Creat Ratio: 254 mg/g{creat} — ABNORMAL HIGH (ref 0–29)
Microalbumin, Urine: 294.3 ug/mL

## 2023-12-18 ENCOUNTER — Encounter: Payer: Self-pay | Admitting: Neurology

## 2023-12-18 ENCOUNTER — Ambulatory Visit: Admitting: Neurology

## 2023-12-18 ENCOUNTER — Other Ambulatory Visit: Payer: Self-pay | Admitting: Orthopedic Surgery

## 2023-12-18 ENCOUNTER — Other Ambulatory Visit: Payer: Self-pay

## 2023-12-18 VITALS — BP 150/89 | HR 94 | Ht 70.0 in | Wt 186.5 lb

## 2023-12-18 DIAGNOSIS — I679 Cerebrovascular disease, unspecified: Secondary | ICD-10-CM

## 2023-12-18 DIAGNOSIS — G8929 Other chronic pain: Secondary | ICD-10-CM

## 2023-12-18 DIAGNOSIS — M545 Low back pain, unspecified: Secondary | ICD-10-CM

## 2023-12-18 DIAGNOSIS — R413 Other amnesia: Secondary | ICD-10-CM

## 2023-12-18 MED ORDER — OXYCODONE-ACETAMINOPHEN 5-325 MG PO TABS: 1.0000 | ORAL_TABLET | Freq: Four times a day (QID) | ORAL | 0 refills | Status: DC | PRN

## 2023-12-18 NOTE — Progress Notes (Signed)
 Meds ordered this encounter  Medications   oxyCODONE -acetaminophen  (PERCOCET/ROXICET) 5-325 MG tablet    Sig: Take 1 tablet by mouth every 6 (six) hours as needed for severe pain (pain score 7-10).    Dispense:  28 tablet    Refill:  0

## 2023-12-18 NOTE — Progress Notes (Signed)
 Chief Complaint  Patient presents with   New Patient (Initial Visit)    RM14, ALONE,  referral for Memory loss, unsteady gait, lightheaded: moca 24    ASSESSMENT AND PLAN  Bryan Rodgers is a 63 y.o. male   Mild cognitive impairment Chronic insomnia Cerebrovascular disease  MRI of the brain showed generalized atrophy moderate small vessel disease, stable  On aspirin , multiple vascular risk factors including diabetes hypertension hyperlipidemia, history of heavy alcohol abuse  Vascular evaluation including ultrasound of carotid artery echocardiogram  He had a history of worsening headache with occasional visual auditory hallucination, responded well to lamotrigine , 100 mg twice a day, getting refill from his primary care, does not want to go down on lower dose, also taking temazepam  30 mg at bedtime  Laboratory evaluation for treatable etiology of memory loss, agree ATN profile  Return in 6 months  DIAGNOSTIC DATA (LABS, IMAGING, TESTING) - I reviewed patient records, labs, notes, testing and imaging myself where available. Geriatric depression scale 1, have more problem with memory than most people,   Functional activity questionnaire  2, have trouble remember appointment, family occasions holidays medications; traveling out of neighborhood driving arranging to take buses  MEDICAL HISTORY:  Bryan Rodgers, is a 63 year old male, seen in request by his primary care physician Dr. Antonetta Quant for evaluation of memory loss  History is obtained from the patient and review of electronic medical records. I personally reviewed pertinent available imaging films in PACS.   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65,  He lives at home with his wife, active in his yard work, church life, used to  drink heavy alcohol, quit in 2014, doing very well  I saw him in 2021 for frequent headaches, also reported some auditory visual hallucinations, had MRI of the brain in 2023, no acute intracranial abnormality mild generalized atrophy, moderate small vessel disease  He was put on lamotrigine  100 mg twice a day as migraine prevention, which was very effective, also helped his mood, taking Restoril  30 mg every night for sleep  Around 2025, he began to notice intermittent memory loss, 1 day he was driving [place], suddenly become confused, thought he was at [facility],  2 months ago woke up 1 morning, remember he had a sister, but could not recall her name or where she lives, eventually it came back to him in 15 minutes  He had a repeat MRI of the brain without contrast July 2025, no acute intracranial abnormality no significant change compared to previous MRI in November 2023, generalized atrophy moderate small vessel disease  Labs in 2025: Normal CBC, CMP mild elevation of alkaline phosphate 146,, A1c 6.1, vitamin D29.2, LDL 36  PHYSICAL EXAM:   Vitals:   12/18/23 0845 12/18/23 0854  BP: (!) 145/78 (!) 150/89  Pulse: 94   Weight: 186 lb 8 oz (84.6 kg)   Height: 5' 10 (1.778 m)    Body mass index is 26.76 kg/m.  PHYSICAL EXAMNIATION:  Gen: NAD, conversant, well nourised, well groomed                     Cardiovascular: Regular rate rhythm, no peripheral edema, warm, nontender. Eyes: Conjunctivae clear without exudates or hemorrhage Neck: Supple, no carotid bruits. Pulmonary: Clear to  auscultation bilaterally   NEUROLOGICAL EXAM:  MENTAL STATUS: Speech/cognition: Awake, alert, oriented to history taking and casual conversation    12/18/2023    8:50 AM  Montreal Cognitive Assessment   Visuospatial/ Executive (0/5) 4  Naming (0/3) 3  Attention: Read list of digits (0/2) 2  Attention: Read list of letters (0/1) 1  Attention: Serial 7 subtraction starting at 100 (0/3) 3   Language: Repeat phrase (0/2) 1  Language : Fluency (0/1) 1  Abstraction (0/2) 2  Delayed Recall (0/5) 1  Orientation (0/6) 6  Total 24    CRANIAL NERVES: CN II: Visual fields are full to confrontation. Pupils are round equal and briskly reactive to light. CN III, IV, VI: extraocular movement are normal. No ptosis. CN V: Facial sensation is intact to light touch CN VII: Face is symmetric with normal eye closure  CN VIII: Hearing is normal to causal conversation. CN IX, X: Phonation is normal. CN XI: Head turning and shoulder shrug are intact  MOTOR: There is no pronator drift of out-stretched arms. Muscle bulk and tone are normal. Muscle strength is normal.  REFLEXES: Reflexes are 2+ and symmetric at the biceps, triceps, knees, and ankles. Plantar responses are flexor.  SENSORY: Intact to light touch, pinprick and vibratory sensation are intact in fingers and toes.  COORDINATION: There is no trunk or limb dysmetria noted.  GAIT/STANCE: Posture is normal. Gait is steady with normal steps, base, arm swing, and turning. Heel and toe walking are normal. Tandem gait is normal.  Romberg is absent.  REVIEW OF SYSTEMS:  Full 14 system review of systems performed and notable only for as above All other review of systems were negative.   ALLERGIES: Allergies  Allergen Reactions   Simvastatin  Other (See Comments)    Visual hallucination    HOME MEDICATIONS: Current Outpatient Medications  Medication Sig Dispense Refill   acetaminophen  (TYLENOL  8 HOUR) 650 MG CR tablet Take one tablet by mouth  one to two times daily for arthritis pain 60 tablet 11   albuterol  (VENTOLIN  HFA) 108 (90 Base) MCG/ACT inhaler Inhale 2 puffs into the lungs every 6 (six) hours as needed for wheezing or shortness of breath. 6.7 g 2   aspirin  EC 81 MG tablet Take 1 tablet (81 mg total) by mouth at bedtime. 30 tablet 11   blood glucose meter kit and supplies Dispense based on patient and insurance  preference. Once daily testing DX E11.9 1 each 0   cyclobenzaprine  (FLEXERIL ) 10 MG tablet Take 1 tablet (10 mg total) by mouth at bedtime. One tablet every night at bedtime as needed for spasm. 90 tablet 1   glucose blood test strip Use as instructed once daily dx e11.9 50 each 11   lamoTRIgine  (LAMICTAL ) 100 MG tablet TAKE ONE TABLET BY MOUTH TWICE DAILY 60 tablet 5   Lancets 30G MISC Once daily testing dx e11.9 50 each 11   naproxen  (NAPROSYN ) 500 MG tablet Take 1 tablet (500 mg total) by mouth 2 (two) times daily with a meal. 60 tablet 5   olmesartan  (BENICAR ) 40 MG tablet Take 1 tablet (40 mg total) by mouth daily. 30 tablet 5   omeprazole  (PRILOSEC) 40 MG capsule TAKE ONE CAPSULE BY MOUTH EVERY DAY 90 capsule 4   oxyCODONE -acetaminophen  (PERCOCET/ROXICET) 5-325 MG tablet Take 1 tablet by mouth every 6 (six) hours as needed for severe pain (pain score 7-10). 28 tablet 0   rosuvastatin  (CRESTOR ) 5 MG tablet TAKE 1 TABLET BY MOUTH ON  MONDAY, WEDNESDAY, AND FRIDAY AT BEDTIME. 36 tablet 3   Semaglutide ,0.25 or 0.5MG /DOS, (OZEMPIC , 0.25 OR 0.5 MG/DOSE,) 2 MG/3ML SOPN Inject 0.25 mg into the skin once a week. 3 mL 3   temazepam  (RESTORIL ) 30 MG capsule Take 1 capsule (30 mg total) by mouth at bedtime as needed for sleep. 30 capsule 5   triamcinolone  (KENALOG ) 0.025 % ointment APPLY TO THE AFFECTED AREA(S) TWICE DAILY AS NEEDED ECZEMA/RASHES 30 g 1   No current facility-administered medications for this visit.    PAST MEDICAL HISTORY: Past Medical History:  Diagnosis Date   Alcoholic (HCC) quit 2014   10-15 beers per day on the weekends    Anemia    Arthritis    Asthma    childhood asthma up to age 50   Chest pain    Palpations   Dyspnea    Elevated CPK    GERD (gastroesophageal reflux disease)    Hallucination    Headache    Hemorrhoids    with bleeding    History of colonic polyps    removed via colonscope   Hypertension    Kidney stone    Migraine    Neuromuscular disorder  (HCC)    Overweight (BMI 25.0-29.9)    Pneumonia    Prediabetes 10/2012   HBA1C is 6.4   Tobacco abuse     PAST SURGICAL HISTORY: Past Surgical History:  Procedure Laterality Date   BIOPSY  01/31/2023   Procedure: BIOPSY;  Surgeon: Cindie Carlin POUR, DO;  Location: [facility];  Service: Endoscopy;;   CATARACT EXTRACTION W/ INTRAOCULAR LENS IMPLANT Bilateral 2012   COLONOSCOPY  2007   Dr. Harvey: internal hemorrhoids, otherwise normal   COLONOSCOPY  2004   reported history of colon polyp   COLONOSCOPY WITH PROPOFOL  N/A 07/26/2016   Procedure: COLONOSCOPY WITH PROPOFOL ;  Surgeon: Harvey Margo CROME, MD;  Location: [facility];  Service: Endoscopy;  Laterality: N/A;  830    COLONOSCOPY WITH PROPOFOL  N/A 01/31/2023   Procedure: COLONOSCOPY WITH PROPOFOL ;  Surgeon: Cindie Carlin POUR, DO;  [facility]: [facility];  Service: Endoscopy;  Laterality: N/A;  11:00am, asa 2   ESOPHAGOGASTRODUODENOSCOPY (EGD) WITH PROPOFOL  N/A 01/31/2023   Procedure: ESOPHAGOGASTRODUODENOSCOPY (EGD) WITH PROPOFOL ;  Surgeon: Cindie Carlin POUR, DO;  [facility]: [facility];  Service: Endoscopy;  Laterality: N/A;   hemmorrhoidectomy  2002   LUMBAR LAMINECTOMY/DECOMPRESSION MICRODISCECTOMY N/A 08/27/2020   Procedure: EXTRAFORAMINAL DECOMPRESSION RIGHT LUMBAR FIVE-SACRAL ONE, RESECT SYNOVIAL CYST;  Surgeon: Lanis Pupa, MD;  Location: [facility];  Service: Neurosurgery;  Laterality: N/A;   POLYPECTOMY  07/26/2016   Procedure: POLYPECTOMY;  Surgeon: Harvey Margo CROME, MD;  [facility]: [facility];  Service: Endoscopy;;  splenic flexure polyps x4   POLYPECTOMY  01/31/2023   Procedure: POLYPECTOMY INTESTINAL;  Surgeon: Cindie Carlin POUR, DO;  Location: [facility];  Service: Endoscopy;;    FAMILY HISTORY: Family History  Problem Relation Age of Onset   Breast cancer Mother    Cancer Father        unsure of origin - spread to bones   Kidney disease Brother    Diabetes Sister    Breast cancer Sister    Colon  cancer Neg Hx     SOCIAL HISTORY: Social History   Socioeconomic History   Marital status: Married    Spouse name: Lyn   Number of children: 0   Years of education: 12   Highest education level: Not on file  Occupational History  Occupation: retired biochemist, clinical  Tobacco Use   Smoking status: Former    Current packs/day: 0.00    Types: Cigarettes    Quit date: 03/16/2012    Years since quitting: 11.7   Smokeless tobacco: Never  Vaping Use   Vaping status: Never Used  Substance and Sexual Activity   Alcohol use: No    Alcohol/week: 0.0 standard drinks of alcohol    Comment: History of alcohol use with beer on weekends, none since 2014    Drug use: No   Sexual activity: Yes  Other Topics Concern   Not on file  Social History Narrative   Patient lives with wife.   Patient has no biological children. He has two stepchildren.   Patient is left handed.   Patient has a high school education.   No daily use of caffeine .   Social Drivers of Corporate Investment Banker Strain: Not on file  Food Insecurity: Not on file  Transportation Needs: Not on file  Physical Activity: Not on file  Stress: Not on file  Social Connections: Not on file  Intimate Partner Violence: Not on file      Modena Callander, M.D. Ph.D.  [facility] [address] [place], [place] [address] Ph: [phone] Fax: [phone]  CC:  Antonetta Rollene BRAVO, MD [address], [address] [place],  [place] [address]  Antonetta Rollene BRAVO, MD

## 2023-12-20 ENCOUNTER — Other Ambulatory Visit: Payer: Self-pay | Admitting: Family Medicine

## 2023-12-20 DIAGNOSIS — G8929 Other chronic pain: Secondary | ICD-10-CM | POA: Diagnosis not present

## 2023-12-20 DIAGNOSIS — R030 Elevated blood-pressure reading, without diagnosis of hypertension: Secondary | ICD-10-CM | POA: Diagnosis not present

## 2023-12-20 DIAGNOSIS — E119 Type 2 diabetes mellitus without complications: Secondary | ICD-10-CM | POA: Diagnosis not present

## 2023-12-20 DIAGNOSIS — Z79899 Other long term (current) drug therapy: Secondary | ICD-10-CM | POA: Diagnosis not present

## 2023-12-20 DIAGNOSIS — M545 Low back pain, unspecified: Secondary | ICD-10-CM | POA: Diagnosis not present

## 2023-12-20 DIAGNOSIS — Z6826 Body mass index (BMI) 26.0-26.9, adult: Secondary | ICD-10-CM | POA: Diagnosis not present

## 2023-12-20 DIAGNOSIS — Z0000 Encounter for general adult medical examination without abnormal findings: Secondary | ICD-10-CM | POA: Diagnosis not present

## 2023-12-21 ENCOUNTER — Ambulatory Visit: Payer: Self-pay | Admitting: Neurology

## 2023-12-21 LAB — ATN PROFILE
A -- Beta-amyloid 42/40 Ratio: 0.129 (ref >0.102)
Beta-amyloid 40: 143.1 pg/mL
Beta-amyloid 42: 18.43 pg/mL
N -- NfL, Plasma: 1.12 pg/mL (ref 0.00–3.65)
T -- p-tau181: 0.35 pg/mL (ref 0.00–0.97)

## 2023-12-21 LAB — RPR: RPR Ser Ql: NONREACTIVE

## 2023-12-21 LAB — TSH: TSH: 0.449 u[IU]/mL — ABNORMAL LOW (ref 0.450–4.500)

## 2023-12-21 LAB — VITAMIN B12: Vitamin B-12: 451 pg/mL (ref 232–1245)

## 2023-12-22 ENCOUNTER — Ambulatory Visit (HOSPITAL_COMMUNITY)
Admission: RE | Admit: 2023-12-22 | Discharge: 2023-12-22 | Disposition: A | Discharge status: Home / Self Care | Source: Ambulatory Visit | Attending: Neurology | Admitting: Neurology

## 2023-12-22 ENCOUNTER — Ambulatory Visit (HOSPITAL_COMMUNITY)
Admission: RE | Admit: 2023-12-22 | Discharge: 2023-12-22 | Disposition: A | Discharge status: Home / Self Care | Source: Ambulatory Visit | Attending: Family Medicine | Admitting: Family Medicine

## 2023-12-22 ENCOUNTER — Ambulatory Visit: Payer: Self-pay | Admitting: Neurology

## 2023-12-22 DIAGNOSIS — I679 Cerebrovascular disease, unspecified: Secondary | ICD-10-CM

## 2023-12-22 DIAGNOSIS — R413 Other amnesia: Secondary | ICD-10-CM | POA: Diagnosis not present

## 2023-12-22 DIAGNOSIS — I10 Essential (primary) hypertension: Secondary | ICD-10-CM | POA: Diagnosis not present

## 2023-12-22 DIAGNOSIS — I6523 Occlusion and stenosis of bilateral carotid arteries: Secondary | ICD-10-CM | POA: Diagnosis not present

## 2023-12-22 DIAGNOSIS — G459 Transient cerebral ischemic attack, unspecified: Secondary | ICD-10-CM | POA: Diagnosis not present

## 2023-12-22 LAB — ECHOCARDIOGRAM COMPLETE
Area-P 1/2: 3.99 cm2
MV M vel: 5.05 m/s
MV Peak grad: 102 mmHg
S' Lateral: 3.2 cm
Single Plane A4C EF: 59.7 %

## 2023-12-22 NOTE — Progress Notes (Signed)
  Echocardiogram 2D Echocardiogram has been performed.  Bryan Rodgers, RDCS 12/22/2023, 2:41 PM

## 2023-12-25 ENCOUNTER — Other Ambulatory Visit: Payer: Self-pay | Admitting: Orthopedic Surgery

## 2023-12-25 DIAGNOSIS — M545 Low back pain, unspecified: Secondary | ICD-10-CM

## 2023-12-25 DIAGNOSIS — G8929 Other chronic pain: Secondary | ICD-10-CM

## 2023-12-27 DIAGNOSIS — E119 Type 2 diabetes mellitus without complications: Secondary | ICD-10-CM | POA: Diagnosis not present

## 2023-12-27 DIAGNOSIS — Z6826 Body mass index (BMI) 26.0-26.9, adult: Secondary | ICD-10-CM | POA: Diagnosis not present

## 2023-12-27 DIAGNOSIS — R3129 Other microscopic hematuria: Secondary | ICD-10-CM | POA: Diagnosis not present

## 2023-12-27 DIAGNOSIS — M545 Low back pain, unspecified: Secondary | ICD-10-CM | POA: Diagnosis not present

## 2023-12-27 DIAGNOSIS — R030 Elevated blood-pressure reading, without diagnosis of hypertension: Secondary | ICD-10-CM | POA: Diagnosis not present

## 2023-12-27 DIAGNOSIS — Z79899 Other long term (current) drug therapy: Secondary | ICD-10-CM | POA: Diagnosis not present

## 2023-12-27 DIAGNOSIS — G8929 Other chronic pain: Secondary | ICD-10-CM | POA: Diagnosis not present

## 2023-12-29 ENCOUNTER — Ambulatory Visit: Payer: Self-pay | Admitting: Family Medicine

## 2024-01-03 DIAGNOSIS — R3129 Other microscopic hematuria: Secondary | ICD-10-CM | POA: Diagnosis not present

## 2024-01-03 DIAGNOSIS — G8929 Other chronic pain: Secondary | ICD-10-CM | POA: Diagnosis not present

## 2024-01-03 DIAGNOSIS — Z6827 Body mass index (BMI) 27.0-27.9, adult: Secondary | ICD-10-CM | POA: Diagnosis not present

## 2024-01-03 DIAGNOSIS — Z79899 Other long term (current) drug therapy: Secondary | ICD-10-CM | POA: Diagnosis not present

## 2024-01-03 DIAGNOSIS — E119 Type 2 diabetes mellitus without complications: Secondary | ICD-10-CM | POA: Diagnosis not present

## 2024-01-03 DIAGNOSIS — M545 Low back pain, unspecified: Secondary | ICD-10-CM | POA: Diagnosis not present

## 2024-01-03 DIAGNOSIS — R030 Elevated blood-pressure reading, without diagnosis of hypertension: Secondary | ICD-10-CM | POA: Diagnosis not present

## 2024-01-05 ENCOUNTER — Ambulatory Visit

## 2024-01-05 VITALS — BP 145/87

## 2024-01-05 DIAGNOSIS — I10 Essential (primary) hypertension: Secondary | ICD-10-CM

## 2024-01-05 NOTE — Progress Notes (Signed)
 Patient is in office today for a nurse visit for Blood Pressure Check. Patient blood pressure was 154/73 then 145/87, Patient No chest pain, No shortness of breath, No dyspnea on exertion

## 2024-01-08 ENCOUNTER — Ambulatory Visit

## 2024-01-10 ENCOUNTER — Ambulatory Visit: Admitting: Urology

## 2024-01-16 ENCOUNTER — Telehealth: Payer: Self-pay

## 2024-01-16 NOTE — Telephone Encounter (Signed)
 Copied from CRM #[id number]. Topic: Referral - Request for Referral >> Jan 16, 2024  9:04 AM Darshell M wrote: Did the patient discuss referral with their provider in the last year? No (If No - schedule appointment) (If Yes - send message)  Appointment offered? Yes - Appointment previously scheduled  Type of order/referral and detailed reason for visit: Gastroenterology  Preference of office, provider, location: Patient wants to make sure its local.  If referral order, have you been seen by this specialty before? Yes (If Yes, this issue or another issue? When? Where? Unable to remember  Can we respond through MyChart? Yes

## 2024-02-01 ENCOUNTER — Encounter (HOSPITAL_COMMUNITY): Payer: Self-pay

## 2024-02-01 ENCOUNTER — Emergency Department (HOSPITAL_COMMUNITY)

## 2024-02-01 ENCOUNTER — Other Ambulatory Visit: Payer: Self-pay

## 2024-02-01 ENCOUNTER — Emergency Department (HOSPITAL_COMMUNITY)
Admission: EM | Admit: 2024-02-01 | Discharge: 2024-02-01 | Disposition: A | Discharge status: Home / Self Care | Attending: Emergency Medicine | Admitting: Emergency Medicine

## 2024-02-01 DIAGNOSIS — R509 Fever, unspecified: Secondary | ICD-10-CM | POA: Diagnosis present

## 2024-02-01 DIAGNOSIS — Z7982 Long term (current) use of aspirin: Secondary | ICD-10-CM | POA: Insufficient documentation

## 2024-02-01 DIAGNOSIS — J189 Pneumonia, unspecified organism: Secondary | ICD-10-CM | POA: Diagnosis not present

## 2024-02-01 LAB — COMPREHENSIVE METABOLIC PANEL WITH GFR
ALT: 17 U/L (ref 0–44)
AST: 22 U/L (ref 15–41)
Albumin: 3.8 g/dL (ref 3.5–5.0)
Alkaline Phosphatase: 122 U/L (ref 38–126)
Anion gap: 14 (ref 5–15)
BUN: 14 mg/dL (ref 8–23)
CO2: 25 mmol/L (ref 22–32)
Calcium: 9 mg/dL (ref 8.9–10.3)
Chloride: 102 mmol/L (ref 98–111)
Creatinine, Ser: 1.05 mg/dL (ref 0.61–1.24)
GFR, Estimated: >60 mL/min
Glucose, Bld: 94 mg/dL (ref 70–99)
Potassium: 3.8 mmol/L (ref 3.5–5.1)
Sodium: 141 mmol/L (ref 135–145)
Total Bilirubin: 0.4 mg/dL (ref 0.0–1.2)
Total Protein: 7.1 g/dL (ref 6.5–8.1)

## 2024-02-01 LAB — CBC WITH DIFFERENTIAL/PLATELET
Abs Immature Granulocytes: 0.05 K/uL (ref 0.00–0.07)
Basophils Absolute: 0 K/uL (ref 0.0–0.1)
Basophils Relative: 1 %
Eosinophils Absolute: 0.2 K/uL (ref 0.0–0.5)
Eosinophils Relative: 3 %
HCT: 40.7 % (ref 39.0–52.0)
Hemoglobin: 13.2 g/dL (ref 13.0–17.0)
Immature Granulocytes: 1 %
Lymphocytes Relative: 16 %
Lymphs Abs: 1.3 K/uL (ref 0.7–4.0)
MCH: 29.2 pg (ref 26.0–34.0)
MCHC: 32.4 g/dL (ref 30.0–36.0)
MCV: 90 fL (ref 80.0–100.0)
Monocytes Absolute: 0.9 K/uL (ref 0.1–1.0)
Monocytes Relative: 11 %
Neutro Abs: 5.6 K/uL (ref 1.7–7.7)
Neutrophils Relative %: 68 %
Platelets: 159 K/uL (ref 150–400)
RBC: 4.52 MIL/uL (ref 4.22–5.81)
RDW: 14.3 % (ref 11.5–15.5)
WBC: 8.1 K/uL (ref 4.0–10.5)
nRBC: 0 % (ref 0.0–0.2)

## 2024-02-01 MED ORDER — AZITHROMYCIN 250 MG PO TABS
500.0000 mg | ORAL_TABLET | Freq: Once | ORAL | Status: AC
  Administered 2024-02-01: 500 mg via ORAL
  Filled 2024-02-01: qty 2

## 2024-02-01 MED ORDER — AZITHROMYCIN 250 MG PO TABS: 250.0000 mg | ORAL_TABLET | Freq: Every day | ORAL | 0 refills | Status: DC

## 2024-02-01 MED ORDER — AMOXICILLIN-POT CLAVULANATE 875-125 MG PO TABS
1.0000 | ORAL_TABLET | Freq: Once | ORAL | Status: AC
  Administered 2024-02-01: 1 via ORAL
  Filled 2024-02-01: qty 1

## 2024-02-01 MED ORDER — AMOXICILLIN-POT CLAVULANATE 875-125 MG PO TABS: 1.0000 | ORAL_TABLET | Freq: Two times a day (BID) | ORAL | 0 refills | Status: AC

## 2024-02-01 NOTE — ED Notes (Signed)
 ED Provider at bedside.

## 2024-02-01 NOTE — ED Provider Notes (Signed)
 " [place] EM[facility] AT [facility] Provider Note   CSN: [id number] Arrival date & time: 02/01/24  9149     Patient presents with: Cough   Bryan Rodgers is a 64 y.o. male.  {Add pertinent medical, surgical, social history, OB history to HPI:3531} 64 year old male history of asthma and pneumonia who presents to the emergency department with congestion, myalgias, fever, and cough.  Patient reports that over the weekend he started feeling sick.  Has had a productive cough with green sputum.  Temperature of 103F.  Says that several other family members are sick now as well.  Has been trying Alka-Seltzer cold, [place], NyQuil.       Prior to Admission medications  Medication Sig Start Date End Date Taking? Authorizing Provider  acetaminophen  (TYLENOL  8 HOUR) 650 MG CR tablet Take one tablet by mouth  one to two times daily for arthritis pain 05/17/22   Antonetta Rollene FORBES, MD  albuterol  (VENTOLIN  HFA) [phone] Base) MCG/ACT inhaler Inhale 2 puffs into the lungs every 6 (six) hours as needed for wheezing or shortness of breath. 08/11/23   Antonetta Rollene FORBES, MD  aspirin  EC 81 MG tablet Take 1 tablet (81 mg total) by mouth at bedtime. 09/01/20   Lanis Pupa, MD  blood glucose meter kit and supplies Dispense based on patient and insurance preference. Once daily testing DX E11.9 11/18/20   Antonetta Rollene FORBES, MD  cyclobenzaprine  (FLEXERIL ) 10 MG tablet Take 1 tablet (10 mg total) by mouth at bedtime. One tablet every night at bedtime as needed for spasm. 12/14/23   Antonetta Rollene FORBES, MD  glucose blood test strip Use as instructed once daily dx e11.9 11/18/20   Antonetta Rollene FORBES, MD  lamoTRIgine  (LAMICTAL ) 100 MG tablet TAKE ONE TABLET BY MOUTH TWICE DAILY 11/20/23   Antonetta Rollene FORBES, MD  Lancets 30G MISC Once daily testing dx e11.9 11/18/20   Antonetta Rollene FORBES, MD  naproxen  (NAPROSYN ) 500 MG tablet Take 1 tablet (500 mg total) by mouth 2 (two) times daily with  a meal. 11/27/23   Margrette Taft FORBES, MD  olmesartan  (BENICAR ) 40 MG tablet Take 1 tablet (40 mg total) by mouth daily. 12/14/23   Antonetta Rollene FORBES, MD  omeprazole  (PRILOSEC) 40 MG capsule TAKE ONE CAPSULE BY MOUTH EVERY DAY 11/22/23   Antonetta Rollene FORBES, MD  oxyCODONE -acetaminophen  (PERCOCET/ROXICET) 5-325 MG tablet Take 1 tablet by mouth every 6 (six) hours as needed for severe pain (pain score 7-10). 12/26/23   Onesimo Oneil LABOR, MD  rosuvastatin  (CRESTOR ) 5 MG tablet TAKE ONE TABLET BY MOUTH ON MONDAY, WEDNESDAY AND FRIDAY AT BEDTIME 12/20/23   Antonetta Rollene FORBES, MD  Semaglutide ,0.25 or 0.5MG /DOS, (OZEMPIC , 0.25 OR 0.5 MG/DOSE,) 2 MG/3ML SOPN Inject 0.25 mg into the skin once a week. 12/14/23   Antonetta Rollene FORBES, MD  temazepam  (RESTORIL ) 30 MG capsule Take 1 capsule (30 mg total) by mouth at bedtime as needed for sleep. 08/11/23   Antonetta Rollene FORBES, MD  triamcinolone  (KENALOG ) 0.025 % ointment APPLY TO THE AFFECTED AREA(S) TWICE DAILY AS NEEDED ECZEMA/RASHES 07/04/22   Antonetta Rollene FORBES, MD    Allergies: Simvastatin     Review of Systems  Updated Vital Signs BP (!) 166/85 (BP Location: Right Arm)   Pulse 93   Resp 18   Ht 5' 10 (1.778 m)   Wt 84.6 kg   SpO2 95%   BMI 26.76 kg/m   Physical Exam Vitals and nursing note reviewed.  Constitutional:  General: He is not in acute distress.    Appearance: He is well-developed.  HENT:     Head: Normocephalic and atraumatic.     Right Ear: External ear normal.     Left Ear: External ear normal.     Nose: Congestion present.  Eyes:     Extraocular Movements: Extraocular movements intact.     Conjunctiva/sclera: Conjunctivae normal.     Pupils: Pupils are equal, round, and reactive to light.  Cardiovascular:     Rate and Rhythm: Normal rate and regular rhythm.     Heart sounds: Normal heart sounds.  Pulmonary:     Effort: Pulmonary effort is normal. No respiratory distress.     Breath sounds: Normal breath sounds.   Musculoskeletal:     Cervical back: Normal range of motion and neck supple.  Skin:    General: Skin is warm and dry.  Neurological:     Mental Status: He is alert. Mental status is at baseline.  Psychiatric:        Mood and Affect: Mood normal.        Behavior: Behavior normal.     (all labs ordered are listed, but only abnormal results are displayed) Labs Reviewed - No data to display  EKG: None  Radiology: No results found.  {Document cardiac monitor, telemetry assessment procedure when appropriate:32947} Procedures   Medications Ordered in the ED - No data to display    {Click here for ABCD2, HEART and other calculators REFRESH Note before signing:1}                              Medical Decision Making Amount and/or Complexity of Data Reviewed Labs: ordered. Radiology: ordered.   ***  {Document critical care time when appropriate  Document review of labs and clinical decision tools ie CHADS2VASC2, etc  Document your independent review of radiology images and any outside records  Document your discussion with family members, caretakers and with consultants  Document social determinants of health affecting pt's care  Document your decision making why or why not admission, treatments were needed:32947:::1}   Final diagnoses:  None    ED Discharge Orders     None        "

## 2024-02-01 NOTE — ED Triage Notes (Signed)
 Pt arrived via POV c/o on-going cough since Sunday. Pt endorses having a fever at home, chills and body aches.

## 2024-02-01 NOTE — Discharge Instructions (Signed)
 You were seen for your pneumonia in the emergency department.   At home, please take the antibiotics we prescribed you.    Check your MyChart online for the results of any tests that had not resulted by the time you left the emergency department.   Follow-up with your primary doctor in 2-3 days regarding your visit.    Return immediately to the emergency department if you experience any of the following: difficulty breathing, or any other concerning symptoms.    Thank you for visiting our Emergency Department. It was a pleasure taking care of you today.

## 2024-02-08 ENCOUNTER — Ambulatory Visit: Admitting: Family Medicine

## 2024-02-08 ENCOUNTER — Encounter: Payer: Self-pay | Admitting: Family Medicine

## 2024-02-08 VITALS — BP 144/90 | HR 82 | Resp 16 | Ht 70.0 in | Wt 190.1 lb

## 2024-02-08 DIAGNOSIS — J189 Pneumonia, unspecified organism: Secondary | ICD-10-CM | POA: Insufficient documentation

## 2024-02-08 DIAGNOSIS — Z23 Encounter for immunization: Secondary | ICD-10-CM | POA: Insufficient documentation

## 2024-02-08 DIAGNOSIS — Z09 Encounter for follow-up examination after completed treatment for conditions other than malignant neoplasm: Secondary | ICD-10-CM | POA: Diagnosis not present

## 2024-02-08 DIAGNOSIS — E1169 Type 2 diabetes mellitus with other specified complication: Secondary | ICD-10-CM

## 2024-02-08 DIAGNOSIS — I10 Essential (primary) hypertension: Secondary | ICD-10-CM | POA: Diagnosis not present

## 2024-02-08 MED ORDER — CYCLOBENZAPRINE HCL 10 MG PO TABS: ORAL_TABLET | ORAL | 3 refills | Status: AC

## 2024-02-08 MED ORDER — AMLODIPINE BESYLATE 5 MG PO TABS: 5.0000 mg | ORAL_TABLET | Freq: Every day | ORAL | 3 refills | Status: AC

## 2024-02-08 NOTE — Progress Notes (Signed)
 "  Bryan Rodgers     MRN: [id number]      DOB: 19-Mar-1960  Chief Complaint  Patient presents with   Medical Management of Chronic Issues    Follow up     HPI Bryan Rodgers is here for follow up and re-evaluation of chronic medical conditions, medication management and review of any available recent lab and radiology data.  Diagnosed with CAp on 02/01/2024 evaluated and treated in the ED Preventive health is updated, specifically  Cancer screening and Immunization.   Diagnosed with CAP on 02/01/2024 ,jusat completed antibiotic course and much improved, though still has persistent cough. The PT denies any adverse reactions to current medications since the last visit.   ROS Denies sinus pressure, nasal congestion, ear pain or sore throat. Improved  chest congestion, productive cough or wheezing. Denies chest pains, palpitations and leg swelling Denies abdominal pain, nausea, vomiting,diarrhea or constipation.   Denies dysuria, frequency, hesitancy or incontinence. Denies joint pain, swelling and limitation in mobility. Denies headaches, seizures, numbness, or tingling. Denies  uncontrolled depression, anxiety or insomnia. Denies skin break down or rash.   PE  BP (!) 144/90   Pulse 82   Resp 16   Ht 5' 10 (1.778 m)   Wt 190 lb 1.9 oz (86.2 kg)   SpO2 94%   BMI 27.28 kg/m   Patient alert and oriented and in no cardiopulmonary distress.  HEENT: No facial asymmetry, EOMI,     Neck supple .  Chest: decreased air entry in bases , few bibasilar crackles , no wheezesCVS: S1, S2 no murmurs, no S3.Regular rate.  ABD: Soft non tender.   Ext: No edema  MS: Adequate ROM spine, shoulders, hips and knees.  Skin: Intact, no ulcerations or rash noted.  Psych: Good eye contact, normal affect. Memory intact not anxious or depressed appearing.  CNS: CN 2-12 intact, power,  normal throughout.no focal deficits noted.   Assessment & Plan  HTN (hypertension) DASH diet and commitment  to daily physical activity for a minimum of 30 minutes discussed and encouraged, as a part of hypertension management. The importance of attaining a healthy weight is also discussed.     02/08/2024    8:49 AM 02/08/2024    8:16 AM 02/01/2024   10:30 AM 02/01/2024   10:15 AM 02/01/2024   10:00 AM 02/01/2024    9:45 AM 02/01/2024    9:30 AM  BP/Weight  Systolic BP 144 160 156 155 159 157 148  Diastolic BP 90 82 84 90 84 84 73  Wt. (Lbs)  190.12       BMI  27.28 kg/m2          Not controlled , add amlodipine  5 mg  Immunization due After obtaining informed consent, the Hep B#2  vaccine is  administered , with no adverse effect noted at the time of administration.   Community acquired pneumonia Dx 02/01/2024, rept CXR in 6 weeks, clinically imporoved  Encounter for examination following treatment at hospital Patient in for follow up of recent ED visit  Discharge summary, and laboratory and radiology data are reviewed, and any questions or concerns  are discussed. Specific issues requiring follow up are specifically addressed.   Type 2 diabetes mellitus with other specified complication (HCC) Diabetes associated with hypertension and hyperlipidemia  Bryan Rodgers is reminded of the importance of commitment to daily physical activity for 30 minutes or more, as able and the need to limit carbohydrate intake to 30 to 60 grams  per meal to help with blood sugar control.   The need to take medication as prescribed, test blood sugar as directed, and to call between visits if there is a concern that blood sugar is uncontrolled is also discussed.   Bryan Rodgers is reminded of the importance of daily foot exam, annual eye examination, and good blood sugar, blood pressure and cholesterol control.     Latest Ref Rng & Units 02/01/2024    9:32 AM 12/14/2023    9:45 AM 11/08/2023    8:59 AM 07/19/2023    8:38 AM 12/08/2022    3:00 PM  Diabetic Labs  HbA1c 4.8 - 5.6 %    6.1  6.1   Micro/Creat Ratio 0 - 29  mg/g creat  254    50   Chol 100 - 199 mg/dL    94  94   HDL >60 mg/dL    43  51   Calc LDL 0 - 99 mg/dL    36  30   Triglycerides 0 - 149 mg/dL    67  52   Creatinine 0.61 - 1.24 mg/dL 8.94   8.97  8.96  9.04       02/08/2024    8:49 AM 02/08/2024    8:16 AM 02/01/2024   10:30 AM 02/01/2024   10:15 AM 02/01/2024   10:00 AM 02/01/2024    9:45 AM 02/01/2024    9:30 AM  BP/Weight  Systolic BP 144 160 156 155 159 157 148  Diastolic BP 90 82 84 90 84 84 73  Wt. (Lbs)  190.12       BMI  27.28 kg/m2           12/14/2023    8:40 AM 01/13/2021   10:00 AM  Foot/eye exam completion dates  Foot Form Completion Done Done      Updated lab needed at/ before next visit.   "

## 2024-02-08 NOTE — Assessment & Plan Note (Addendum)
 DASH diet and commitment to daily physical activity for a minimum of 30 minutes discussed and encouraged, as a part of hypertension management. The importance of attaining a healthy weight is also discussed.     02/08/2024    8:49 AM 02/08/2024    8:16 AM 02/01/2024   10:30 AM 02/01/2024   10:15 AM 02/01/2024   10:00 AM 02/01/2024    9:45 AM 02/01/2024    9:30 AM  BP/Weight  Systolic BP 144 160 156 155 159 157 148  Diastolic BP 90 82 84 90 84 84 73  Wt. (Lbs)  190.12       BMI  27.28 kg/m2          Not controlled , add amlodipine  5 mg

## 2024-02-08 NOTE — Assessment & Plan Note (Signed)
Patient in for follow up of recent ED visit. Discharge summary, and laboratory and radiology data are reviewed, and any questions or concerns  are discussed. Specific issues requiring follow up are specifically addressed.  

## 2024-02-08 NOTE — Assessment & Plan Note (Signed)
 Dx 02/01/2024, rept CXR in 6 weeks, clinically imporoved

## 2024-02-08 NOTE — Patient Instructions (Addendum)
 F/U last week in February  Hep B#2 today  New ADDITIONAL MEDICATION FOR BLOOD PRESSURE IS AMLODIPINE  5 MG DAILY  HbA1c today.  Chest x-ray  February 19 or after at the hospital.  Dose increase in muscle relaxant to twice daily as discussed.  Thanks for choosing [facility], we consider it a privelige to serve you.

## 2024-02-08 NOTE — Assessment & Plan Note (Signed)
 After obtaining informed consent, the Hep B#2  vaccine is  administered , with no adverse effect noted at the time of administration.

## 2024-02-08 NOTE — Assessment & Plan Note (Signed)
 Diabetes associated with hypertension and hyperlipidemia  Bryan Rodgers is reminded of the importance of commitment to daily physical activity for 30 minutes or more, as able and the need to limit carbohydrate intake to 30 to 60 grams per meal to help with blood sugar control.   The need to take medication as prescribed, test blood sugar as directed, and to call between visits if there is a concern that blood sugar is uncontrolled is also discussed.   Bryan Rodgers is reminded of the importance of daily foot exam, annual eye examination, and good blood sugar, blood pressure and cholesterol control.     Latest Ref Rng & Units 02/01/2024    9:32 AM 12/14/2023    9:45 AM 11/08/2023    8:59 AM 07/19/2023    8:38 AM 12/08/2022    3:00 PM  Diabetic Labs  HbA1c 4.8 - 5.6 %    6.1  6.1   Micro/Creat Ratio 0 - 29 mg/g creat  254    50   Chol 100 - 199 mg/dL    94  94   HDL >60 mg/dL    43  51   Calc LDL 0 - 99 mg/dL    36  30   Triglycerides 0 - 149 mg/dL    67  52   Creatinine 0.61 - 1.24 mg/dL 8.94   8.97  8.96  9.04       02/08/2024    8:49 AM 02/08/2024    8:16 AM 02/01/2024   10:30 AM 02/01/2024   10:15 AM 02/01/2024   10:00 AM 02/01/2024    9:45 AM 02/01/2024    9:30 AM  BP/Weight  Systolic BP 144 160 156 155 159 157 148  Diastolic BP 90 82 84 90 84 84 73  Wt. (Lbs)  190.12       BMI  27.28 kg/m2           12/14/2023    8:40 AM 01/13/2021   10:00 AM  Foot/eye exam completion dates  Foot Form Completion Done Done      Updated lab needed at/ before next visit.

## 2024-02-09 ENCOUNTER — Ambulatory Visit: Payer: Self-pay | Admitting: Family Medicine

## 2024-02-09 LAB — HEMOGLOBIN A1C
Est. average glucose Bld gHb Est-mCnc: 134 mg/dL
Hgb A1c MFr Bld: 6.3 % — ABNORMAL HIGH (ref 4.8–5.6)

## 2024-02-18 ENCOUNTER — Other Ambulatory Visit: Payer: Self-pay | Admitting: Family Medicine

## 2024-02-18 NOTE — Progress Notes (Unsigned)
 "  Assessment:  - History of elevated PSA, last checked in 2013.  Repeat check in October 2025 1.5  - Persistent microscopic hematuria in a smoker, negative cystoscopy and stone protocol CT  Plan:   -Urine cytology sent today  - Unless that is abnormal, return as needed  - Annual prostate cancer screening per PCP   HPI: 10.21.2025:  64 yo male presents for E/M of persistent microscopic hematuria.  History dates back to presentation to the emergency room at [facility] in October 2024 for back pain.  Felt to be of musculoskeletal etiology.  He had CT scan at that time which revealed normal-appearing kidneys.  Developed similar symptoms recently--right flank pain--and presented to the emergency room at [facility].  He had CT scan which again revealed no urologic abnormalities.  Noted to have microscopic hematuria at that time as well.  PSA checked in October was 1.5  1.20.2026: Here today for cystoscopy. PMH: Past Medical History:  Diagnosis Date   Alcoholic (HCC) quit 2014   10-15 beers per day on the weekends    Anemia    Arthritis    Asthma    childhood asthma up to age 44   Chest pain    Palpations   Dyspnea    Elevated CPK    GERD (gastroesophageal reflux disease)    Hallucination    Headache    Hemorrhoids    with bleeding    History of colonic polyps    removed via colonscope   Hypertension    Kidney stone    Migraine    Neuromuscular disorder (HCC)    Overweight (BMI 25.0-29.9)    Pneumonia    Prediabetes 10/2012   HBA1C is 6.4   Tobacco abuse     Surgical History: Past Surgical History:  Procedure Laterality Date   BIOPSY  01/31/2023   Procedure: BIOPSY;  Surgeon: Cindie Carlin POUR, DO;  Location: [facility];  Service: Endoscopy;;   CATARACT EXTRACTION W/ INTRAOCULAR LENS IMPLANT Bilateral 2012   COLONOSCOPY  2007   Dr. Harvey: internal hemorrhoids, otherwise normal   COLONOSCOPY  2004   reported history of colon polyp   COLONOSCOPY  WITH PROPOFOL  N/A 07/26/2016   Procedure: COLONOSCOPY WITH PROPOFOL ;  Surgeon: Harvey Margo CROME, MD;  Location: [facility];  Service: Endoscopy;  Laterality: N/A;  830    COLONOSCOPY WITH PROPOFOL  N/A 01/31/2023   Procedure: COLONOSCOPY WITH PROPOFOL ;  Surgeon: Cindie Carlin POUR, DO;  Location: [facility];  Service: Endoscopy;  Laterality: N/A;  11:00am, asa 2   ESOPHAGOGASTRODUODENOSCOPY (EGD) WITH PROPOFOL  N/A 01/31/2023   Procedure: ESOPHAGOGASTRODUODENOSCOPY (EGD) WITH PROPOFOL ;  Surgeon: Cindie Carlin POUR, DO;  Location: [facility];  Service: Endoscopy;  Laterality: N/A;   hemmorrhoidectomy  2002   LUMBAR LAMINECTOMY/DECOMPRESSION MICRODISCECTOMY N/A 08/27/2020   Procedure: EXTRAFORAMINAL DECOMPRESSION RIGHT LUMBAR FIVE-SACRAL ONE, RESECT SYNOVIAL CYST;  Surgeon: Lanis Pupa, MD;  Location: [facility];  Service: Neurosurgery;  Laterality: N/A;   POLYPECTOMY  07/26/2016   Procedure: POLYPECTOMY;  Surgeon: Harvey Margo CROME, MD;  Location: [facility];  Service: Endoscopy;;  splenic flexure polyps x4   POLYPECTOMY  01/31/2023   Procedure: POLYPECTOMY INTESTINAL;  Surgeon: Cindie Carlin POUR, DO;  Location: [facility];  Service: Endoscopy;;    Home Medications:  Allergies as of 02/20/2024       Reactions   Simvastatin  Other (See Comments)   Visual hallucination        Medication List  Accurate as of February 18, 2024  8:33 AM. If you have any questions, ask your nurse or doctor.          acetaminophen  650 MG CR tablet Commonly known as: Tylenol  8 Hour Take one tablet by mouth  one to two times daily for arthritis pain   albuterol  108 (90 Base) MCG/ACT inhaler Commonly known as: Ventolin  HFA Inhale 2 puffs into the lungs every 6 (six) hours as needed for wheezing or shortness of breath.   amLODipine  5 MG tablet Commonly known as: NORVASC  Take 1 tablet (5 mg total) by mouth daily.   aspirin  EC 81 MG tablet Take 1 tablet (81 mg total) by mouth at  bedtime.   blood glucose meter kit and supplies Dispense based on patient and insurance preference. Once daily testing DX E11.9   cyclobenzaprine  10 MG tablet Commonly known as: FLEXERIL  Take one tablet by mouth twice daily for muscle spasm   glucose blood test strip Use as instructed once daily dx e11.9   lamoTRIgine  100 MG tablet Commonly known as: LAMICTAL  TAKE ONE TABLET BY MOUTH TWICE DAILY   Lancets 30G Misc Once daily testing dx e11.9   naproxen  500 MG tablet Commonly known as: NAPROSYN  Take 1 tablet (500 mg total) by mouth 2 (two) times daily with a meal.   olmesartan  40 MG tablet Commonly known as: BENICAR  Take 1 tablet (40 mg total) by mouth daily.   omeprazole  40 MG capsule Commonly known as: PRILOSEC TAKE ONE CAPSULE BY MOUTH EVERY DAY   oxyCODONE -acetaminophen  5-325 MG tablet Commonly known as: PERCOCET/ROXICET Take 1 tablet by mouth every 6 (six) hours as needed for severe pain (pain score 7-10).   Ozempic  (0.25 or 0.5 MG/DOSE) 2 MG/3ML Sopn Generic drug: Semaglutide (0.25 or 0.5MG /DOS) Inject 0.25 mg into the skin once a week.   rosuvastatin  5 MG tablet Commonly known as: CRESTOR  TAKE ONE TABLET BY MOUTH ON MONDAY, WEDNESDAY AND FRIDAY AT BEDTIME   temazepam  30 MG capsule Commonly known as: RESTORIL  Take 1 capsule (30 mg total) by mouth at bedtime as needed for sleep.   triamcinolone  0.025 % ointment Commonly known as: KENALOG  APPLY TO THE AFFECTED AREA(S) TWICE DAILY AS NEEDED ECZEMA/RASHES        Allergies:  Allergies  Allergen Reactions   Simvastatin  Other (See Comments)    Visual hallucination    Family History: Family History  Problem Relation Age of Onset   Breast cancer Mother    Cancer Father        unsure of origin - spread to bones   Kidney disease Brother    Diabetes Sister    Breast cancer Sister    Colon cancer Neg Hx     Social History:  reports that he quit smoking about 11 years ago. His smoking use included  cigarettes. He has never used smokeless tobacco. He reports that he does not drink alcohol and does not use drugs.  Laboratory Data: Lab Results  Component Value Date   WBC 8.1 02/01/2024   HGB 13.2 02/01/2024   HCT 40.7 02/01/2024   MCV 90.0 02/01/2024   PLT 159 02/01/2024    Lab Results  Component Value Date   CREATININE 1.05 02/01/2024    Lab Results  Component Value Date   PSA 6.61 (H) 03/10/2011   PSA 1.12 03/04/2009   PSA 0.87 04/26/2008    Lab Results  Component Value Date   TESTOSTERONE  582.52 07/08/2010    Lab Results  Component Value Date  HGBA1C 6.3 (H) 02/08/2024    Cystoscopy Procedure Note:  Indication: Gross hematuria  After informed consent and discussion of the procedure and its risks, Bryan Rodgers was positioned and prepped in the standard fashion.  Cystoscopy was performed with a flexible cystoscope.   Findings: Urethra: No stricture or lesion Prostate: Nonobstructive Bladder neck: Open Ureteral orifices: Normal in configuration and location bilaterally Bladder: No urothelial lesions, trabeculations, foreign bodies  The patient tolerated the procedure well.   CT stone protocol from 11/08/2023 results: No evidence of urolithiasis, hydronephrosis, or other acute findings.   Colonic diverticulosis, without radiographic evidence of diverticulitis.  PSA data reviewed  Old records reviewed   "

## 2024-02-20 ENCOUNTER — Ambulatory Visit (INDEPENDENT_AMBULATORY_CARE_PROVIDER_SITE_OTHER): Admitting: Urology

## 2024-02-20 VITALS — BP 160/76 | HR 86

## 2024-02-20 DIAGNOSIS — R972 Elevated prostate specific antigen [PSA]: Secondary | ICD-10-CM

## 2024-02-20 DIAGNOSIS — Z87438 Personal history of other diseases of male genital organs: Secondary | ICD-10-CM

## 2024-02-20 DIAGNOSIS — N400 Enlarged prostate without lower urinary tract symptoms: Secondary | ICD-10-CM

## 2024-02-20 DIAGNOSIS — R310 Gross hematuria: Secondary | ICD-10-CM | POA: Diagnosis not present

## 2024-02-20 LAB — URINALYSIS, ROUTINE W REFLEX MICROSCOPIC
Bilirubin, UA: NEGATIVE
Glucose, UA: NEGATIVE
Ketones, UA: NEGATIVE
Leukocytes,UA: NEGATIVE
Nitrite, UA: NEGATIVE
Specific Gravity, UA: 1.01 (ref 1.005–1.030)
Urobilinogen, Ur: 0.2 mg/dL (ref 0.2–1.0)
pH, UA: 6 (ref 5.0–7.5)

## 2024-02-20 LAB — MICROSCOPIC EXAMINATION
Bacteria, UA: NONE SEEN
WBC, UA: NONE SEEN /HPF (ref 0–5)

## 2024-02-20 MED ORDER — CIPROFLOXACIN HCL 500 MG PO TABS
500.0000 mg | ORAL_TABLET | Freq: Once | ORAL | Status: AC
  Administered 2024-02-20: 500 mg via ORAL

## 2024-02-20 NOTE — Addendum Note (Signed)
 Addended by: SAMMIE EXIE HERO on: 02/20/2024 11:09 AM   Modules accepted: Orders

## 2024-02-20 NOTE — Progress Notes (Signed)
 Dianon Hematuria   Tracking Number: [id number] Pick Up Request Number: [id number]

## 2024-02-27 LAB — HEMATURIA PROFILE

## 2024-02-28 ENCOUNTER — Ambulatory Visit: Payer: Self-pay | Admitting: Urology

## 2024-02-28 DIAGNOSIS — R3129 Other microscopic hematuria: Secondary | ICD-10-CM

## 2024-02-28 DIAGNOSIS — R310 Gross hematuria: Secondary | ICD-10-CM

## 2024-03-01 ENCOUNTER — Telehealth: Payer: Self-pay

## 2024-03-01 NOTE — Telephone Encounter (Signed)
 SABRA

## 2024-03-05 ENCOUNTER — Telehealth: Payer: Self-pay

## 2024-03-05 MED ORDER — TEMAZEPAM 30 MG PO CAPS: 30.0000 mg | ORAL_CAPSULE | Freq: Every evening | ORAL | 5 refills | Status: AC | PRN

## 2024-03-05 NOTE — Telephone Encounter (Signed)
 Called patient to give him Cytology results per MD Dahlstedt patient voiced his understanding

## 2024-03-05 NOTE — Telephone Encounter (Signed)
 Copied from CRM [id number]. Topic: Clinical - Medication Refill >> Mar 05, 2024 11:06 AM Emylou G wrote: Medication: temazepam  (RESTORIL ) 30 MG capsule  Has the patient contacted their pharmacy? Yes (Agent: If no, request that the patient contact the pharmacy for the refill. If patient does not wish to contact the pharmacy document the reason why and proceed with request.) (Agent: If yes, when and what did the pharmacy advise?) they called us   This is the patient's preferred pharmacy:  [facility] - [place], [place] - [address] [place] [address] Phone: [phone] Fax: [phone]    Is this the correct pharmacy for this prescription? Yes If no, delete pharmacy and type the correct one.   Has the prescription been filled recently? Yes  Is the patient out of the medication? Yes  Has the patient been seen for an appointment in the last year OR does the patient have an upcoming appointment? Yes  Can we respond through MyChart? No  Agent: Please be advised that Rx refills may take up to 3 business days. We ask that you follow-up with your pharmacy.

## 2024-03-05 NOTE — Telephone Encounter (Signed)
 Refill sent

## 2024-04-02 ENCOUNTER — Ambulatory Visit: Payer: Self-pay | Admitting: Family Medicine

## 2024-07-10 ENCOUNTER — Ambulatory Visit: Admitting: Neurology
